# Patient Record
Sex: Male | Born: 1960 | Race: White | Hispanic: No | Marital: Married | Smoking: Former smoker
Health system: Southern US, Community
[De-identification: ages and names within clinical notes are randomized; demographics above are authoritative.]

## PROBLEM LIST (undated history)

## (undated) DIAGNOSIS — H269 Unspecified cataract: Secondary | ICD-10-CM

## (undated) DIAGNOSIS — Z860101 Personal history of adenomatous and serrated colon polyps: Secondary | ICD-10-CM

## (undated) DIAGNOSIS — R454 Irritability and anger: Secondary | ICD-10-CM

## (undated) DIAGNOSIS — D239 Other benign neoplasm of skin, unspecified: Secondary | ICD-10-CM

## (undated) DIAGNOSIS — K589 Irritable bowel syndrome without diarrhea: Secondary | ICD-10-CM

## (undated) DIAGNOSIS — M47812 Spondylosis without myelopathy or radiculopathy, cervical region: Secondary | ICD-10-CM

## (undated) DIAGNOSIS — K219 Gastro-esophageal reflux disease without esophagitis: Secondary | ICD-10-CM

## (undated) DIAGNOSIS — M7712 Lateral epicondylitis, left elbow: Secondary | ICD-10-CM

## (undated) DIAGNOSIS — J392 Other diseases of pharynx: Secondary | ICD-10-CM

## (undated) DIAGNOSIS — F32A Depression, unspecified: Secondary | ICD-10-CM

## (undated) DIAGNOSIS — K52832 Lymphocytic colitis: Secondary | ICD-10-CM

## (undated) DIAGNOSIS — F419 Anxiety disorder, unspecified: Secondary | ICD-10-CM

## (undated) DIAGNOSIS — M51369 Other intervertebral disc degeneration, lumbar region without mention of lumbar back pain or lower extremity pain: Secondary | ICD-10-CM

## (undated) DIAGNOSIS — Z86010 Personal history of colonic polyps: Secondary | ICD-10-CM

## (undated) DIAGNOSIS — M792 Neuralgia and neuritis, unspecified: Secondary | ICD-10-CM

## (undated) DIAGNOSIS — G894 Chronic pain syndrome: Secondary | ICD-10-CM

## (undated) DIAGNOSIS — G2581 Restless legs syndrome: Secondary | ICD-10-CM

## (undated) DIAGNOSIS — G629 Polyneuropathy, unspecified: Secondary | ICD-10-CM

## (undated) DIAGNOSIS — H409 Unspecified glaucoma: Secondary | ICD-10-CM

## (undated) DIAGNOSIS — N400 Enlarged prostate without lower urinary tract symptoms: Secondary | ICD-10-CM

## (undated) DIAGNOSIS — R1310 Dysphagia, unspecified: Secondary | ICD-10-CM

## (undated) HISTORY — DX: Irritability and anger: R45.4

## (undated) HISTORY — DX: Lateral epicondylitis, left elbow: M77.12

## (undated) HISTORY — DX: Other intervertebral disc degeneration, lumbar region without mention of lumbar back pain or lower extremity pain: M51.369

## (undated) HISTORY — DX: Depression, unspecified: F32.A

## (undated) HISTORY — DX: Benign prostatic hyperplasia without lower urinary tract symptoms: N40.0

## (undated) HISTORY — DX: Spondylosis without myelopathy or radiculopathy, cervical region: M47.812

## (undated) HISTORY — DX: Personal history of colonic polyps: Z86.010

## (undated) HISTORY — DX: Dysphagia, unspecified: R13.10

## (undated) HISTORY — PX: COLONOSCOPY W/ POLYPECTOMY: SHX1380

## (undated) HISTORY — DX: Polyneuropathy, unspecified: G62.9

## (undated) HISTORY — PX: TRABECULECTOMY: SHX107

## (undated) HISTORY — DX: Unspecified cataract: H26.9

## (undated) HISTORY — DX: Unspecified glaucoma: H40.9

## (undated) HISTORY — PX: ESOPHAGOGASTRODUODENOSCOPY: SHX1529

## (undated) HISTORY — DX: Restless legs syndrome: G25.81

## (undated) HISTORY — DX: Anxiety disorder, unspecified: F41.9

## (undated) HISTORY — DX: Irritable bowel syndrome, unspecified: K58.9

## (undated) HISTORY — DX: Gastro-esophageal reflux disease without esophagitis: K21.9

## (undated) HISTORY — PX: CATARACT EXTRACTION: SUR2

## (undated) HISTORY — DX: Personal history of adenomatous and serrated colon polyps: Z86.0101

## (undated) HISTORY — PX: EYE SURGERY: SHX253

## (undated) HISTORY — DX: Other diseases of pharynx: J39.2

## (undated) HISTORY — DX: Chronic pain syndrome: G89.4

## (undated) HISTORY — DX: Other benign neoplasm of skin, unspecified: D23.9

## (undated) HISTORY — DX: Ankylosing hyperostosis (forestier), site unspecified: M48.10

## (undated) HISTORY — DX: Neuralgia and neuritis, unspecified: M79.2

---
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
## 1999-06-25 HISTORY — PX: VASECTOMY: SHX75

## 2006-06-24 HISTORY — PX: TRANSURETHRAL RESECTION OF PROSTATE: SHX73

## 2007-06-25 HISTORY — PX: TRABECULECTOMY: SHX107

## 2008-07-18 ENCOUNTER — Encounter: Admission: RE | Admit: 2008-07-18 | Discharge: 2008-07-18 | Payer: Self-pay | Admitting: Family Medicine

## 2009-03-21 ENCOUNTER — Encounter: Admission: RE | Admit: 2009-03-21 | Discharge: 2009-03-21 | Payer: Self-pay | Admitting: Neurology

## 2009-04-05 ENCOUNTER — Emergency Department (HOSPITAL_COMMUNITY): Admission: EM | Admit: 2009-04-05 | Discharge: 2009-04-05 | Payer: Self-pay | Admitting: Family Medicine

## 2009-04-12 ENCOUNTER — Encounter: Admission: RE | Admit: 2009-04-12 | Discharge: 2009-04-12 | Payer: Self-pay | Admitting: Neurology

## 2009-11-15 ENCOUNTER — Ambulatory Visit (HOSPITAL_BASED_OUTPATIENT_CLINIC_OR_DEPARTMENT_OTHER): Admission: RE | Admit: 2009-11-15 | Discharge: 2009-11-16 | Payer: Self-pay | Admitting: Urology

## 2010-04-20 ENCOUNTER — Encounter: Admission: RE | Admit: 2010-04-20 | Discharge: 2010-04-20 | Payer: Self-pay | Admitting: Family Medicine

## 2010-09-10 LAB — POCT HEMOGLOBIN-HEMACUE: Hemoglobin: 15.9 g/dL (ref 13.0–17.0)

## 2010-12-10 ENCOUNTER — Other Ambulatory Visit: Payer: Self-pay | Admitting: *Deleted

## 2010-12-10 DIAGNOSIS — M79603 Pain in arm, unspecified: Secondary | ICD-10-CM

## 2010-12-10 DIAGNOSIS — M542 Cervicalgia: Secondary | ICD-10-CM

## 2010-12-15 ENCOUNTER — Ambulatory Visit
Admission: RE | Admit: 2010-12-15 | Discharge: 2010-12-15 | Disposition: A | Discharge status: Home / Self Care | Payer: 59 | Source: Ambulatory Visit | Attending: *Deleted | Admitting: *Deleted

## 2010-12-15 DIAGNOSIS — M79603 Pain in arm, unspecified: Secondary | ICD-10-CM

## 2010-12-15 DIAGNOSIS — M542 Cervicalgia: Secondary | ICD-10-CM

## 2012-06-11 DIAGNOSIS — R209 Unspecified disturbances of skin sensation: Secondary | ICD-10-CM | POA: Insufficient documentation

## 2012-06-11 DIAGNOSIS — G63 Polyneuropathy in diseases classified elsewhere: Secondary | ICD-10-CM | POA: Insufficient documentation

## 2012-06-11 DIAGNOSIS — M47812 Spondylosis without myelopathy or radiculopathy, cervical region: Secondary | ICD-10-CM | POA: Insufficient documentation

## 2012-10-07 ENCOUNTER — Encounter: Payer: Self-pay | Admitting: Neurology

## 2012-10-07 DIAGNOSIS — G63 Polyneuropathy in diseases classified elsewhere: Secondary | ICD-10-CM

## 2012-10-07 DIAGNOSIS — M47812 Spondylosis without myelopathy or radiculopathy, cervical region: Secondary | ICD-10-CM

## 2012-10-07 DIAGNOSIS — R209 Unspecified disturbances of skin sensation: Secondary | ICD-10-CM

## 2012-10-13 ENCOUNTER — Ambulatory Visit (INDEPENDENT_AMBULATORY_CARE_PROVIDER_SITE_OTHER): Payer: 59 | Admitting: Neurology

## 2012-10-13 ENCOUNTER — Encounter: Payer: Self-pay | Admitting: Neurology

## 2012-10-13 VITALS — BP 116/67 | HR 76 | Ht 73.25 in | Wt 172.0 lb

## 2012-10-13 DIAGNOSIS — G63 Polyneuropathy in diseases classified elsewhere: Secondary | ICD-10-CM

## 2012-10-13 DIAGNOSIS — R209 Unspecified disturbances of skin sensation: Secondary | ICD-10-CM

## 2012-10-13 MED ORDER — NORTRIPTYLINE HCL 10 MG PO CAPS: ORAL_CAPSULE | ORAL | Status: DC

## 2012-10-13 NOTE — Progress Notes (Signed)
Reason for visit: Peripheral neuropathy  Keith Mckee is an 52 y.o. male  History of present illness:  Keith Mckee is a 52 year old right-handed white male with a three-year history of a peripheral neuropathy associated with numbness in the feet and hands. The patient has discomfort that affects his ability to sleep at night. The patient was placed on low-dose nortriptyline, taking 20 mg at night. The patient indicates that he had excellent improvement in his symptoms for about one month, but the discomfort has come back on him. The patient is not sleeping well on certain nights. The patient may have significant variation in his discomfort level from one day to the next. The patient denies any significant balance issues. The patient indicates that his father also has evidence of a peripheral neuropathy, but he is greater than 84 years old. The patient returns to this office for an evaluation.   Past Medical History  Diagnosis Date  . Peripheral neuropathy   . Glaucoma   . Enlargement (benign) of prostate   . Cervical spondylosis     Past Surgical History  Procedure Laterality Date  . Vasectomy    . Trabeculectomy      BPH    Family History  Problem Relation Age of Onset  . Glaucoma Father   . Heart disease Father   . Neuropathy Father     Peripheral neuropathy    Social history:  reports that he quit smoking about 14 years ago. His smoking use included Cigarettes. He smoked 0.00 packs per day. He does not have any smokeless tobacco history on file. He reports that  drinks alcohol. He reports that he does not use illicit drugs.  Allergies:  Allergies  Allergen Reactions  . Gabapentin     tired    Medications:  Current Outpatient Prescriptions on File Prior to Visit  Medication Sig Dispense Refill  . bimatoprost (LUMIGAN) 0.01 % SOLN Place 1 drop into both eyes at bedtime.      . prednisoLONE sodium phosphate (INFLAMASE FORTE) 1 % ophthalmic solution Place 1 drop  into both eyes daily.       No current facility-administered medications on file prior to visit.    ROS:  Out of a complete 14 system review of symptoms, the patient complains only of the following symptoms, and all other reviewed systems are negative.  Numbness Insomnia Restless legs  Blood pressure 116/67, pulse 76, height 6' 1.25" (1.861 m), weight 172 lb (78.019 kg).  Physical Exam  General: The patient is alert and cooperative at the time of the examination.  Skin: No significant peripheral edema is noted.   Neurologic Exam  Cranial nerves: Facial symmetry is present. Speech is normal, no aphasia or dysarthria is noted. Extraocular movements are full. Visual fields are full.  Motor: The patient has good strength in all 4 extremities. The patient is able to walk on heels and the toes.  Coordination: The patient has good finger-nose-finger and heel-to-shin bilaterally.  Gait and station: The patient has a normal gait. Tandem gait is normal. Romberg is negative. No drift is seen.  Reflexes: Deep tendon reflexes are symmetric, but are depressed.   Assessment/Plan:  1. Peripheral neuropathy  The patient will require an increase in the nortriptyline dose to control the level of discomfort. The patient will followup in 5 or 6 months. The patient will contact me if he needs a higher dose of this medication. The patient appears to be tolerating the medication well. Previously, he did  not tolerate gabapentin, as this made him feel tired throughout the day.  Keith Palau MD 10/13/2012 8:39 PM  [facility] [address] [place], [place] [address]  Phone [phone] Fax [phone]

## 2013-01-18 ENCOUNTER — Other Ambulatory Visit: Payer: Self-pay | Admitting: Gastroenterology

## 2013-03-16 ENCOUNTER — Ambulatory Visit (INDEPENDENT_AMBULATORY_CARE_PROVIDER_SITE_OTHER): Payer: 59 | Admitting: Neurology

## 2013-03-16 ENCOUNTER — Encounter: Payer: Self-pay | Admitting: Neurology

## 2013-03-16 VITALS — BP 116/75 | HR 91 | Wt 177.0 lb

## 2013-03-16 DIAGNOSIS — R209 Unspecified disturbances of skin sensation: Secondary | ICD-10-CM

## 2013-03-16 DIAGNOSIS — G63 Polyneuropathy in diseases classified elsewhere: Secondary | ICD-10-CM

## 2013-03-16 NOTE — Progress Notes (Signed)
Reason for visit: Peripheral neuropathy  Keith Mckee is an 52 y.o. male  History of present illness:  Keith Mckee is a 52 year old right-handed white male with a history of a peripheral neuropathy associated discomfort in the feet and hands. The patient indicates that the nortriptyline has helped him significantly, but the effects of the medication will wear off after several months. The patient is on 30 mg at night, and he is having issues with constipation, and problems with voiding the bladder. The patient underwent a TURP in the past, which initially helped, but he is now having problems with urine flow once again. The patient indicates that he is taking fiber for constipation, but he is still having problems. The patient wonders whether he might want to switch off of the nortriptyline medication. The patient returns to this office for an evaluation. The patient denies any problems with balance or falls.  Past Medical History  Diagnosis Date  . Peripheral neuropathy   . Glaucoma   . Enlargement (benign) of prostate   . Cervical spondylosis   . Lateral epicondylitis of left elbow     Past Surgical History  Procedure Laterality Date  . Vasectomy    . Trabeculectomy      BPH    Family History  Problem Relation Age of Onset  . Glaucoma Father   . Heart disease Father   . Neuropathy Father     Peripheral neuropathy    Social history:  reports that he quit smoking about 14 years ago. His smoking use included Cigarettes. He smoked 0.00 packs per day. He has never used smokeless tobacco. He reports that  drinks alcohol. He reports that he does not use illicit drugs.    Allergies  Allergen Reactions  . Gabapentin     tired    Medications:  Current Outpatient Prescriptions on File Prior to Visit  Medication Sig Dispense Refill  . bimatoprost (LUMIGAN) 0.01 % SOLN Place 1 drop into both eyes at bedtime.      . Multiple Vitamin (MULTIVITAMIN) tablet Take 1 tablet by  mouth daily.      . nortriptyline (PAMELOR) 10 MG capsule Three capsules at night  90 capsule  5  . prednisoLONE sodium phosphate (INFLAMASE FORTE) 1 % ophthalmic solution Place 1 drop into both eyes daily.      . Probiotic Product (PROBIOTIC DAILY PO) Take 1 tablet by mouth 2 (two) times daily.       No current facility-administered medications on file prior to visit.    ROS:  Out of a complete 14 system review of symptoms, the patient complains only of the following symptoms, and all other reviewed systems are negative.  Blurred vision, double vision Numbness Constipation  Blood pressure 116/75, pulse 91, weight 177 lb (80.287 kg).  Physical Exam  General: The patient is alert and cooperative at the time of the examination.  Skin: No significant peripheral edema is noted.   Neurologic Exam  Cranial nerves: Facial symmetry is present. Speech is normal, no aphasia or dysarthria is noted. Extraocular movements are full. Visual fields are full.  Motor: The patient has good strength in all 4 extremities.  Coordination: The patient has good finger-nose-finger and heel-to-shin bilaterally.  Gait and station: The patient has a normal gait. Tandem gait is normal. Romberg is negative. No drift is seen.  Reflexes: Deep tendon reflexes are symmetric, but are depressed.   Assessment/Plan:  1. Peripheral neuropathy  The patient wants to come off of the  nortriptyline secondary to side effects associated with his bowels and bladder. The patient will be switched to Cymbalta, and samples were given. The patient will start at 30 mg dose, and then go to a 60 mg dose. If he finds that this is helpful, and well tolerated, he will contact our office for a prescription. The patient otherwise will followup in 6 months.  Marlan Palau MD 03/16/2013 7:02 PM  [facility] [address] [place], [place] [address]  Phone [phone] Fax [phone]

## 2013-04-02 ENCOUNTER — Other Ambulatory Visit: Payer: Self-pay | Admitting: Neurology

## 2013-04-02 ENCOUNTER — Encounter: Payer: Self-pay | Admitting: Neurology

## 2013-04-02 MED ORDER — NORTRIPTYLINE HCL 10 MG PO CAPS: ORAL_CAPSULE | ORAL | Status: DC

## 2013-04-29 ENCOUNTER — Other Ambulatory Visit: Payer: Self-pay

## 2013-05-13 ENCOUNTER — Other Ambulatory Visit: Payer: Self-pay | Admitting: Family Medicine

## 2013-05-13 ENCOUNTER — Ambulatory Visit
Admission: RE | Admit: 2013-05-13 | Discharge: 2013-05-13 | Disposition: A | Discharge status: Home / Self Care | Payer: 59 | Source: Ambulatory Visit | Attending: Family Medicine | Admitting: Family Medicine

## 2013-05-13 DIAGNOSIS — J209 Acute bronchitis, unspecified: Secondary | ICD-10-CM

## 2013-06-04 ENCOUNTER — Encounter: Payer: Self-pay | Admitting: Pulmonary Disease

## 2013-06-04 ENCOUNTER — Ambulatory Visit (INDEPENDENT_AMBULATORY_CARE_PROVIDER_SITE_OTHER): Payer: 59 | Admitting: Pulmonary Disease

## 2013-06-04 VITALS — BP 102/64 | HR 93 | Temp 98.2°F | Ht 73.0 in | Wt 172.6 lb

## 2013-06-04 DIAGNOSIS — R0609 Other forms of dyspnea: Secondary | ICD-10-CM

## 2013-06-04 DIAGNOSIS — R0600 Dyspnea, unspecified: Secondary | ICD-10-CM

## 2013-06-04 NOTE — Assessment & Plan Note (Signed)
The patient is describing dyspnea at rest and with exertion of 2 months duration. He notices this at rest with a feeling of shallow breathing, but also with exertion such as one flight of stairs. He is still able to participate in his exercise program. He has been treated for airways disease with no response. His lungs are totally clear today as was his recent chest x-ray. He does have a history of cervical spondylosis, and has upper extremity neuropathy of unknown origin. It raises the question whether he has something going on in his cervical spine that may be resulting in diaphragm dysfunction or respiratory muscle weakness? His history is really not suggestive of airways disease, despite his smoking history. At this point, he needs to have full pulmonary function studies off all bronchodilator medications. If these are unremarkable, would consider cardiac disease as well. He may benefit from a cardiopulmonary exercise test for further delineation of his sob.

## 2013-06-04 NOTE — Progress Notes (Signed)
   Subjective:    Patient ID: Keith Mckee, male    DOB: Jan 22, 1961, 52 y.o.   MRN: [id number]  HPI The patient is a 52 year old male who I've been asked to see for dyspnea. The patient was in his usual state of health until approximately 2 months ago, when he began to notice progressive shortness of breath over a one-week period, and it has been stable since then. He initially thought this was related to Cymbalta, but discontinuation did not improve his breathing. He primarily describes a feeling of shortness of breath at rest, and his perception is that it is related to shallow breathing or an inability to take a deep breath. He states that he will get winded walking up one flight of stairs, but yet he can still participate in his exercise program. He has no cough or chest congestion, but has had some hoarseness and what sounds like upper airway wheezing. He denies any history of asthma or allergies, and saw no difference in his breathing with a trial of Symbicort and prednisone. He has had a recent chest x-ray last month that was normal. The patient does have a history of cervical spondylosis, and has some type of unusual peripheral neuropathy that has been unexplained. However, he denies any motor weakness. He is being followed by neurology very closely, who has done extensive testing according to the patient. He has a history of smoking for 15 years, but has not done so for quite some time.   Review of Systems  Constitutional: Negative for fever and unexpected weight change.  HENT: Positive for sore throat. Negative for congestion, dental problem, ear pain, nosebleeds, postnasal drip, rhinorrhea, sinus pressure, sneezing and trouble swallowing.   Eyes: Negative for redness and itching.  Respiratory: Positive for shortness of breath. Negative for cough, chest tightness and wheezing.   Cardiovascular: Positive for chest pain ( only with bending--discomfort right side). Negative for palpitations (  irregular heartbeats) and leg swelling.  Gastrointestinal: Negative for nausea and vomiting.  Genitourinary: Negative for dysuria.  Musculoskeletal: Negative for joint swelling.  Skin: Negative for rash.  Neurological: Negative for headaches.  Hematological: Does not bruise/bleed easily.  Psychiatric/Behavioral: Negative for dysphoric mood. The patient is not nervous/anxious.        Objective:   Physical Exam Constitutional:  Well developed, no acute distress  HENT:  Nares patent without discharge  Oropharynx without exudate, palate and uvula are normal  Eyes:  Perrla, eomi, no scleral icterus  Neck:  No JVD, no TMG  Cardiovascular:  Normal rate, regular rhythm, no rubs or gallops.  No murmurs        Intact distal pulses  Pulmonary :  Normal breath sounds, no stridor or respiratory distress   No rales, rhonchi, or wheezing  Abdominal:  Soft, nondistended, bowel sounds present.  No tenderness noted.   Musculoskeletal:  No lower extremity edema noted.  Lymph Nodes:  No cervical lymphadenopathy noted  Skin:  No cyanosis noted  Neurologic:  Alert, appropriate, moves all 4 extremities without obvious deficit.         Assessment & Plan:

## 2013-06-04 NOTE — Patient Instructions (Signed)
Will do full breathing tests to evaluate airways and lung volumes. Stop all inhalers.  Will do the testing once you have been off inhalers for 2 weeks.  Will arrange followup once the results are available.

## 2013-07-15 ENCOUNTER — Ambulatory Visit (INDEPENDENT_AMBULATORY_CARE_PROVIDER_SITE_OTHER): Payer: 59 | Admitting: Pulmonary Disease

## 2013-07-15 ENCOUNTER — Encounter: Payer: Self-pay | Admitting: Pulmonary Disease

## 2013-07-15 VITALS — BP 112/78 | HR 90 | Temp 98.1°F | Ht 72.0 in | Wt 175.0 lb

## 2013-07-15 DIAGNOSIS — R0989 Other specified symptoms and signs involving the circulatory and respiratory systems: Secondary | ICD-10-CM

## 2013-07-15 DIAGNOSIS — R0600 Dyspnea, unspecified: Secondary | ICD-10-CM

## 2013-07-15 DIAGNOSIS — R0609 Other forms of dyspnea: Secondary | ICD-10-CM

## 2013-07-15 LAB — PULMONARY FUNCTION TEST
DL/VA % PRED: 82 %
DL/VA: 3.91 ml/min/mmHg/L
DLCO UNC: 24.49 ml/min/mmHg
DLCO unc % pred: 69 %
FEF 25-75 POST: 2.07 L/s
FEF 25-75 Pre: 2.18 L/sec
FEF2575-%CHANGE-POST: -4 %
FEF2575-%PRED-POST: 57 %
FEF2575-%PRED-PRE: 60 %
FEV1-%Change-Post: 0 %
FEV1-%PRED-POST: 78 %
FEV1-%PRED-PRE: 78 %
FEV1-POST: 3.27 L
FEV1-Pre: 3.26 L
FEV1FVC-%CHANGE-POST: 3 %
FEV1FVC-%PRED-PRE: 90 %
FEV6-%CHANGE-POST: -2 %
FEV6-%PRED-PRE: 88 %
FEV6-%Pred-Post: 85 %
FEV6-Post: 4.44 L
FEV6-Pre: 4.56 L
FEV6FVC-%Change-Post: 0 %
FEV6FVC-%PRED-POST: 103 %
FEV6FVC-%Pred-Pre: 103 %
FVC-%CHANGE-POST: -2 %
FVC-%PRED-POST: 82 %
FVC-%Pred-Pre: 85 %
FVC-PRE: 4.6 L
FVC-Post: 4.46 L
POST FEV1/FVC RATIO: 73 %
PRE FEV1/FVC RATIO: 71 %
PRE FEV6/FVC RATIO: 99 %
Post FEV6/FVC ratio: 100 %
RV % PRED: 110 %
RV: 2.4 L
TLC % pred: 93 %
TLC: 6.91 L

## 2013-07-15 NOTE — Progress Notes (Signed)
   Subjective:    Patient ID: Keith Mckee, male    DOB: [birth year], 53 y.o.   MRN: [id number]  HPI The patient comes in today for followup of his recent PFTs, as part of a workup for unexplained dyspnea. He was found to have no airflow obstruction, no restriction, and a mild reduction in his diffusion capacity. I have had a long discussion with him about his breathing studies, and answered all of his questions.   Review of Systems  Constitutional: Negative for fever and unexpected weight change.  HENT: Negative for congestion, dental problem, ear pain, nosebleeds, postnasal drip, rhinorrhea, sinus pressure, sneezing, sore throat and trouble swallowing.   Eyes: Negative for redness and itching.  Respiratory: Negative for cough, chest tightness, shortness of breath and wheezing.   Cardiovascular: Negative for palpitations and leg swelling.  Gastrointestinal: Negative for nausea and vomiting.  Genitourinary: Negative for dysuria.  Musculoskeletal: Negative for joint swelling.  Skin: Negative for rash.  Neurological: Negative for headaches.  Hematological: Does not bruise/bleed easily.  Psychiatric/Behavioral: Negative for dysphoric mood. The patient is not nervous/anxious.        Objective:   Physical Exam Thin male in no acute distress Nose without purulence or discharge noted Neck without lymphadenopathy or thyromegaly Lower extremities without edema, no cyanosis  Alert and oriented, moves all 4 extremities.       Assessment & Plan:

## 2013-07-15 NOTE — Progress Notes (Signed)
PFT done today. Jennifer Castillo, CMA  

## 2013-07-15 NOTE — Assessment & Plan Note (Signed)
The patient's PFTs today showed no airflow obstruction, no restriction, and a mild reduction in his diffusion capacity. This combined with a normal exam, clear chest x-ray, and a CT scan in the recent past with no evidence for interstitial disease, makes it unlikely that his dyspnea is related to a pulmonary process. I still cannot totally exclude neuromuscular weakness as being a contributing factor, however his total lung capacity has not been compromised. The patient has a strong family history of early onset heart disease, and with his mildly reduced DLCO, I think he needs a cardiac evaluation. If this is unrevealing, then we need to followup with further investigational studies for his symptoms. I would probably start with a cardiopulmonary exercise test for unexplained dyspnea. It had a long discussion with the patient about this, and he voices understanding. I will send a note to his primary care physician to arrange for a cardiac evaluation.

## 2013-07-15 NOTE — Patient Instructions (Signed)
Will send a note to Dr Alyson Ingles to arrange for a cardiac workup. If this is unrevealing, would consider a cardiopulmonary exercise test to evaluate for occult causes of shortness of breath.

## 2013-07-30 ENCOUNTER — Encounter: Payer: Self-pay | Admitting: Pulmonary Disease

## 2013-07-30 ENCOUNTER — Telehealth: Payer: Self-pay | Admitting: Pulmonary Disease

## 2013-07-30 ENCOUNTER — Other Ambulatory Visit: Payer: Self-pay | Admitting: Pulmonary Disease

## 2013-07-30 DIAGNOSIS — R0602 Shortness of breath: Secondary | ICD-10-CM

## 2013-07-30 DIAGNOSIS — Z8249 Family history of ischemic heart disease and other diseases of the circulatory system: Secondary | ICD-10-CM

## 2013-07-30 NOTE — Telephone Encounter (Signed)
Please d/c the order to refer to cardiology.  If the person who ordered would look at my note, this needs to come from the pt's primary md not [place].  Thanks.  Let pt know as well his primary needs to order this since he has a preference which cardiologist he would like to see you.

## 2013-08-02 NOTE — Telephone Encounter (Signed)
A MyChart message has been sent to the pt to let him the know that below information. I will talk with the [facility] to make sure this appointment does not get scheduled under [facility] name.

## 2013-08-10 ENCOUNTER — Ambulatory Visit: Payer: 59 | Admitting: Internal Medicine

## 2013-09-14 ENCOUNTER — Encounter: Payer: Self-pay | Admitting: Internal Medicine

## 2013-09-14 ENCOUNTER — Ambulatory Visit (INDEPENDENT_AMBULATORY_CARE_PROVIDER_SITE_OTHER): Payer: 59 | Admitting: Internal Medicine

## 2013-09-14 VITALS — BP 112/72 | HR 83 | Ht 73.0 in | Wt 176.9 lb

## 2013-09-14 DIAGNOSIS — R0609 Other forms of dyspnea: Secondary | ICD-10-CM

## 2013-09-14 DIAGNOSIS — R0989 Other specified symptoms and signs involving the circulatory and respiratory systems: Secondary | ICD-10-CM

## 2013-09-14 DIAGNOSIS — R0602 Shortness of breath: Secondary | ICD-10-CM

## 2013-09-14 DIAGNOSIS — R0600 Dyspnea, unspecified: Secondary | ICD-10-CM

## 2013-09-14 NOTE — Progress Notes (Signed)
OFFICE NOTE  Chief Complaint:  Dyspnea on exertion  Primary Care Physician: Vena Austria, MD  HPI:  Keith Mckee is a pleasant 53 year old male kindly referred to me by Dr. Gwenette Greet for evaluation of shortness of breath. His past medical history is significant for mostly ocular problems include glaucoma and eye surgery. He said has a family history of heart disease with his father who had CABG at age 80. His grandfather had heart disease as well. He said some problems with reflux but otherwise is fairly healthy. He is a former smoker but quit over 10 years ago. He drinks about 12 beers a week mostly on weekends. Does exercise regularly about 3-5 times a week for 60 minutes, mostly cardio, without any significant shortness of breath or chest pain. He does feel when he starts to exercise she is a little short of breath but after a while his breathing kicks in. He was thoroughly evaluated by Dr. Gwenette Greet including imaging and pulmonary function testing which were normal.  Keith Mckee also has neuropathy of unknown origin and he is currently on nortriptyline for this and followed by Dr. Jannifer Franklin. He reports his symptoms have improved, however it may be possible that there is a neurologic cause of his breathing difficulty. He reports his breathing is worse when he lays on his side particularly at night and there is definite positional component.  PMHx:  Past Medical History  Diagnosis Date  . Peripheral neuropathy   . Glaucoma   . Enlargement (benign) of prostate   . Cervical spondylosis   . Lateral epicondylitis of left elbow     Past Surgical History  Procedure Laterality Date  . Vasectomy    . Trabeculectomy      BPH  . Transurethral resection of prostate    . Cataract extraction      FAMHx:  Family History  Problem Relation Age of Onset  . Glaucoma Father   . Heart disease Father   . Neuropathy Father     Peripheral neuropathy    SOCHx:   reports that he quit  smoking about 15 years ago. His smoking use included Cigarettes. He has a 11.25 pack-year smoking history. He has never used smokeless tobacco. He reports that he drinks alcohol. He reports that he does not use illicit drugs.  ALLERGIES:  Allergies  Allergen Reactions  . Gabapentin     tired    ROS: A comprehensive review of systems was negative except for: Eyes: positive for glaucoma Respiratory: positive for dyspnea on exertion Neurological: positive for neuropathy  HOME MEDS: Current Outpatient Prescriptions  Medication Sig Dispense Refill  . bimatoprost (LUMIGAN) 0.01 % SOLN Place 1 drop into the left eye at bedtime.       . Multiple Vitamin (MULTIVITAMIN) tablet Take 1 tablet by mouth daily.      . nortriptyline (PAMELOR) 10 MG capsule Take 30 mg by mouth at bedtime. Three capsules at night      . polyethylene glycol (MIRALAX / GLYCOLAX) packet Take 17 g by mouth daily.      . Probiotic Product (PROBIOTIC DAILY PO) Take 2 tablets by mouth daily.       Marland Kitchen SIMBRINZA 1-0.2 % SUSP Place 1 drop into the left eye 2 (two) times daily.      . Sodium Bicarbonate POWD by Does not apply route. 1 tsp at night for heartburn and sore throat.       No current facility-administered medications for this visit.  LABS/IMAGING: No results found for this or any previous visit (from the past 48 hour(s)). No results found.  VITALS: BP 112/72  Pulse 83  Ht 6\' 1"  (1.854 m)  Wt 176 lb 14.4 oz (80.241 kg)  BMI 23.34 kg/m2  EXAM: General appearance: alert and no distress Neck: no carotid bruit, no JVD and thyroid not enlarged, symmetric, no tenderness/mass/nodules Lungs: clear to auscultation bilaterally Heart: regular rate and rhythm, S1, S2 normal, no murmur, click, rub or gallop Abdomen: soft, non-tender; bowel sounds normal; no masses,  no organomegaly Extremities: extremities normal, atraumatic, no cyanosis or edema Pulses: 2+ and symmetric Skin: Skin color, texture, turgor normal. No  rashes or lesions Neurologic: Grossly normal Psych: Mood, affect normal  EKG: Normal sinus rhythm at 83  ASSESSMENT: 1. Dyspnea on exertion 2. Family history premature coronary disease  PLAN: 1.   Mr. Leaf has few cardiac risk factors. He was a prior smoker however not heavy and quit over 10 years ago. His pulmonary function testing and pulmonary imaging were normal. He does have an unexplained peripheral neuropathy in his raises the question as to whether or not there may be a neuropathic component to his shortness of breath. He reports being able to exercise at a fairly high level, getting his heart rate up to the 150s and only noticing a small amount of shortness of breath at the beginning of exercise.  I would recommend cardiopulmonary exercise testing as a good way to evaluate for potential ischemia, low VO2, or other potential cardiopulmonary causes of his shortness of breath. Traditional treadmill exercise stress testing or nuclear stress testing is really not indicated given the high level of exertion he can perform during routine exercise without limitation.  Thanks again for your referral. Plan to see him back in a few weeks to discuss the results of his CPET testing.  Pixie Casino, MD, [facility] Attending Cardiologist [facility]  Malcolm Hetz C 09/14/2013, 8:41 AM

## 2013-09-14 NOTE — Patient Instructions (Signed)
Dr Debara Pickett has ordered a cardiometabolic test - this is done in our office on Mondays.  What is a Cardiopulmonary Exercise Test (CPET)?   The Cardiopulmonary Exercise Test is a highly sensitive, non-invasive stress test. It is considered a stress test because the exercise stresses your body's systems by making them work faster and harder. A disease or condition that affects the heart, lungs or muscles will limit how much faster and harder these systems can work. A CPET assesses how well the heart, lungs, and muscles are working individually, and how these systems are working in unison. Your heart and lungs work together to deliver oxygen to your muscles, where it is used to make energy, and to remove carbon dioxide from your body.  The full cardiopulmonary system is assessed during a CPET by measuring the amount of oxygen your body is using, the amount of carbon dioxide it is producing, your breathing pattern, and electrocardiogram (EKG) while you are riding a stationary bicycle.  The traditional treadmill stress test only relies on the EKG, which only partially assesses the heart and nothing else. Besides detecting problems in multiple body systems, the CPET is also used to monitor changes in your disease condition, the effect of certain medications on your body, and if medical therapy is improving your condition.  What conditions can be detected/monitored by the Cardiopulmonary ExerciseTest?  Heart, lung, and metabolic conditions may cause shortness of breath, exercise intolerance or discomfort and pain in the chest. The CPET is the only test that can simultaneously determine which of these systems is causing the problem   Your physician recommends that you schedule a follow-up appointment in about 1 month - after your test.

## 2013-09-20 DIAGNOSIS — R0602 Shortness of breath: Secondary | ICD-10-CM

## 2013-09-22 ENCOUNTER — Ambulatory Visit (INDEPENDENT_AMBULATORY_CARE_PROVIDER_SITE_OTHER): Payer: 59 | Admitting: Neurology

## 2013-09-22 ENCOUNTER — Encounter: Payer: Self-pay | Admitting: Neurology

## 2013-09-22 VITALS — BP 118/76 | HR 95 | Wt 175.0 lb

## 2013-09-22 DIAGNOSIS — G63 Polyneuropathy in diseases classified elsewhere: Secondary | ICD-10-CM

## 2013-09-22 DIAGNOSIS — R0989 Other specified symptoms and signs involving the circulatory and respiratory systems: Secondary | ICD-10-CM

## 2013-09-22 DIAGNOSIS — M47812 Spondylosis without myelopathy or radiculopathy, cervical region: Secondary | ICD-10-CM

## 2013-09-22 DIAGNOSIS — R0600 Dyspnea, unspecified: Secondary | ICD-10-CM

## 2013-09-22 DIAGNOSIS — R0609 Other forms of dyspnea: Secondary | ICD-10-CM

## 2013-09-22 NOTE — Progress Notes (Signed)
Reason for visit: Peripheral neuropathy  Keith Mckee is an 53 y.o. male  History of present illness:  Keith Mckee is a 53 year old right-handed white male with a history of a peripheral neuropathy. The patient has some dysesthesias affecting the hands and feet, and he complains more about the hands that he does of the lower extremities. The patient has been on nortriptyline which offered some benefit, as he has most of his discomfort at nighttime. The patient indicates that he could not tolerate Cymbalta well, and the nortriptyline is causing a lot of constipation. The patient is taking MiraLax on a daily basis. The patient indicates that recently he has had some issues with shortness of breath. The patient has had a full pulmonary and cardiology evaluation that has not been revealing. The patient was told that he might have a neuropathy problem affecting his diaphragm. The patient denies any significant balance issues, and he has had no falls. The nortriptyline does allow him to sleep well at night.  Past Medical History  Diagnosis Date  . Peripheral neuropathy   . Glaucoma   . Enlargement (benign) of prostate   . Cervical spondylosis   . Lateral epicondylitis of left elbow     Past Surgical History  Procedure Laterality Date  . Vasectomy    . Trabeculectomy      BPH  . Transurethral resection of prostate    . Cataract extraction      Family History  Problem Relation Age of Onset  . Glaucoma Father   . Heart disease Father   . Neuropathy Father     Peripheral neuropathy    Social history:  reports that he quit smoking about 15 years ago. His smoking use included Cigarettes. He has a 11.25 pack-year smoking history. He has never used smokeless tobacco. He reports that he drinks alcohol. He reports that he does not use illicit drugs.    Allergies  Allergen Reactions  . Gabapentin     tired    Medications:  Current Outpatient Prescriptions on File Prior to Visit    Medication Sig Dispense Refill  . bimatoprost (LUMIGAN) 0.01 % SOLN Place 1 drop into the left eye at bedtime.       . Multiple Vitamin (MULTIVITAMIN) tablet Take 1 tablet by mouth daily.      . nortriptyline (PAMELOR) 10 MG capsule Take 30 mg by mouth at bedtime. Three capsules at night      . polyethylene glycol (MIRALAX / GLYCOLAX) packet Take 17 g by mouth daily.      . Probiotic Product (PROBIOTIC DAILY PO) Take 2 tablets by mouth daily.       Marland Kitchen SIMBRINZA 1-0.2 % SUSP Place 1 drop into the left eye 2 (two) times daily.      . Sodium Bicarbonate POWD by Does not apply route. 1 tsp at night for heartburn and sore throat.       No current facility-administered medications on file prior to visit.    ROS:  Out of a complete 14 system review of symptoms, the patient complains only of the following symptoms, and all other reviewed systems are negative.  Neck stiffness Eye itching, eye redness, double vision Shortness of breath Constipation Restless legs Back pain, neck pain Numbness  Blood pressure 118/76, pulse 95, weight 175 lb (79.379 kg).  Physical Exam  General: The patient is alert and cooperative at the time of the examination.  Skin: No significant peripheral edema is noted.   Neurologic  Exam  Mental status: The patient is oriented x 3.  Cranial nerves: Facial symmetry is present. Speech is normal, no aphasia or dysarthria is noted. Extraocular movements are full. Visual fields are full.  Motor: The patient has good strength in all 4 extremities. The patient is able walk on heels and the toes.  Sensory examination: Soft touch sensation is symmetric on the face, arms, or legs. No definite stocking pattern pinprick sensory deficit is noted.  Coordination: The patient has good finger-nose-finger and heel-to-shin bilaterally.  Gait and station: The patient has a normal gait. Tandem gait is normal. Romberg is negative. No drift is seen.  Reflexes: Deep tendon  reflexes are symmetric, but are depressed.    MRI cervical spine 12/15/2010:  IMPRESSION:  1. Chronic multilevel cervical disc and facet degeneration with  little change since 2009.  2. Moderate multifactorial right C4 foraminal stenosis has  increased.  3. Multifactorial mild spinal stenosis at C5-C6 with severe  bilateral C6 foraminal stenosis is stable.  4. Moderate multifactorial bilateral C5 and C7 nerve level  foraminal stenosis is stable.    Assessment/Plan:  1. Peripheral neuropathy  2. Cervical spondylosis  3. Reported shortness of breath  I have recommended a repeat nerve conduction study to look at nerve function on the legs and arms to exclude a demyelinating neuropathy process. The patient does not wish to pursue this. The patient has documented cervical spondylosis that is non-operable by MRI that was done in 2012. The patient will be sent for further blood work today. The patient will followup in 6 months. The patient will be given a trial on a topical ointment for the hands and feet to decrease the pain.  Jill Alexanders MD 09/22/2013 8:59 PM  [facility] [address] [place], [place] [address]  Phone [phone] Fax [phone]

## 2013-09-24 LAB — ANTINEUTROPHIL CYTOPLASMIC AB: Atypical pANCA: <1:20 {titer}

## 2013-09-24 LAB — LYME, TOTAL AB TEST/REFLEX: Lyme IgG/IgM Ab: <0.91 {ISR} (ref 0.00–0.90)

## 2013-09-24 LAB — VGCC ANTIBODY: VGCC ANTIBODY: NEGATIVE

## 2013-09-24 LAB — ANTIMYELOPEROXIDASE (MPO) ABS: Myeloperoxidase Ab: <9 U/mL (ref 0.0–9.0)

## 2013-09-24 LAB — SJOGREN'S SYNDROME ANTIBODS(SSA + SSB)

## 2013-09-24 LAB — ACETYLCHOLINE RECEPTOR, BINDING

## 2013-09-24 LAB — SEDIMENTATION RATE: SED RATE: 2 mm/h (ref 0–30)

## 2013-09-27 ENCOUNTER — Telehealth: Payer: Self-pay

## 2013-09-27 NOTE — Telephone Encounter (Signed)
Message copied by Milta Deiters on Mon Sep 27, 2013  8:35 AM ------      Message from: Margette Fast      Created: Fri Sep 24, 2013  5:46 PM       Please call the patient. The blood work results are unremarkable. Thank you.            ----- Message -----         From: [facility] Lab Results In Interface         Sent: 09/23/2013   7:51 AM           To: Kathrynn Ducking, MD                   ------

## 2013-10-03 ENCOUNTER — Other Ambulatory Visit: Payer: Self-pay | Admitting: Neurology

## 2013-10-05 NOTE — Telephone Encounter (Signed)
Called patient three times. No answer.

## 2013-10-21 ENCOUNTER — Encounter: Payer: Self-pay | Admitting: Internal Medicine

## 2013-10-21 ENCOUNTER — Ambulatory Visit (INDEPENDENT_AMBULATORY_CARE_PROVIDER_SITE_OTHER): Payer: 59 | Admitting: Internal Medicine

## 2013-10-21 VITALS — BP 124/70 | HR 80 | Ht 73.0 in | Wt 174.3 lb

## 2013-10-21 DIAGNOSIS — R0600 Dyspnea, unspecified: Secondary | ICD-10-CM

## 2013-10-21 DIAGNOSIS — Z8249 Family history of ischemic heart disease and other diseases of the circulatory system: Secondary | ICD-10-CM

## 2013-10-21 DIAGNOSIS — R0989 Other specified symptoms and signs involving the circulatory and respiratory systems: Secondary | ICD-10-CM

## 2013-10-21 DIAGNOSIS — R0609 Other forms of dyspnea: Secondary | ICD-10-CM

## 2013-10-21 DIAGNOSIS — R9439 Abnormal result of other cardiovascular function study: Secondary | ICD-10-CM

## 2013-10-21 NOTE — Progress Notes (Signed)
OFFICE NOTE  Chief Complaint:  Dyspnea on exertion  Primary Care Physician: Vena Austria, MD  HPI:  Keith Mckee is a pleasant 53 year old male kindly referred to me by Dr. Gwenette Greet for evaluation of shortness of breath. His past medical history is significant for mostly ocular problems include glaucoma and eye surgery. He said has a family history of heart disease with his father who had CABG at age 19. His grandfather had heart disease as well. He said some problems with reflux but otherwise is fairly healthy. He is a former smoker but quit over 10 years ago. He drinks about 12 beers a week mostly on weekends. Does exercise regularly about 3-5 times a week for 60 minutes, mostly cardio, without any significant shortness of breath or chest pain. He does feel when he starts to exercise she is a little short of breath but after a while his breathing kicks in. He was thoroughly evaluated by Dr. Gwenette Greet including imaging and pulmonary function testing which were normal.  Keith Mckee also has neuropathy of unknown origin and he is currently on nortriptyline for this and followed by Dr. Jannifer Franklin. He reports his symptoms have improved, however it may be possible that there is a neurologic cause of his breathing difficulty. He reports his breathing is worse when he lays on his side particularly at night and there is definite positional component.  His cardiometabolic testing. He underwent MET test on 02/20/2014. Our ER was 1.23, peak the VO2 was 70%, peak heart rate was 91% predicted. Heart rate in the VO2 curves showed an ischemic response with an anaerobic threshold at 13.1. Testing did not suggest a pulmonary reason for his shortness of breath. There was a sharp increase in VO 2 with free pedaling suggesting increased oxygen requirements in the extremities.  PMHx:  Past Medical History  Diagnosis Date  . Peripheral neuropathy   . Glaucoma   . Enlargement (benign) of prostate   .  Cervical spondylosis   . Lateral epicondylitis of left elbow     Past Surgical History  Procedure Laterality Date  . Vasectomy    . Trabeculectomy      BPH  . Transurethral resection of prostate    . Cataract extraction      FAMHx:  Family History  Problem Relation Age of Onset  . Glaucoma Father   . Heart disease Father   . Neuropathy Father     Peripheral neuropathy    SOCHx:   reports that he quit smoking about 15 years ago. His smoking use included Cigarettes. He has a 11.25 pack-year smoking history. He has never used smokeless tobacco. He reports that he drinks alcohol. He reports that he does not use illicit drugs.  ALLERGIES:  Allergies  Allergen Reactions  . Gabapentin     tired    ROS: A comprehensive review of systems was negative except for: Eyes: positive for glaucoma Respiratory: positive for dyspnea on exertion Neurological: positive for neuropathy  HOME MEDS: Current Outpatient Prescriptions  Medication Sig Dispense Refill  . bimatoprost (LUMIGAN) 0.01 % SOLN Place 1 drop into the left eye at bedtime.       . Multiple Vitamin (MULTIVITAMIN) tablet Take 1 tablet by mouth daily.      . nortriptyline (PAMELOR) 10 MG capsule Take 30 mg by mouth at bedtime. Three capsules at night      . nortriptyline (PAMELOR) 10 MG capsule TAKE 3 CAPSULES AT NIGHT  270 capsule  1  .  polyethylene glycol (MIRALAX / GLYCOLAX) packet Take 17 g by mouth daily.      . Probiotic Product (PROBIOTIC DAILY PO) Take 2 tablets by mouth daily.       Marland Kitchen SIMBRINZA 1-0.2 % SUSP Place 1 drop into the left eye 2 (two) times daily.      . Sodium Bicarbonate POWD by Does not apply route. 1 tsp at night for heartburn and sore throat.       No current facility-administered medications for this visit.    LABS/IMAGING: No results found for this or any previous visit (from the past 48 hour(s)). No results found.  VITALS: BP 124/70  Pulse 80  Ht 6' 1"  (1.854 m)  Wt 174 lb 4.8 oz (79.062  kg)  BMI 23.00 kg/m2  EXAM: dyspnea  EKG: deferred  ASSESSMENT: 1. Dyspnea on exertion - MET test with VO2 of 70%, low risk 2. Family history premature coronary disease  PLAN: 1.   Keith Mckee continues to have some shortness of breath with exercise. I informed him that he would benefit from increased duration of exercise with a lower heart rate. He was provided with exercise Ranges based on his VO2. In addition, he may benefit from supplements including coenzyme Q 10 and L. arginine. He is still concerned about the possibility of coronary disease given his family history. As far screening is concerned one possibility would be a coronary calcium score. If this is negative, it would solidify his low risk. If it is abnormal, we may need to reassess his medical therapy including possibly driving his cholesterol lower with a statin. I will plan to contact him with the results of his calcium score once its obtained.  Pixie Casino, MD, [facility] Attending Cardiologist [place] 10/21/2013, 9:53 AM

## 2013-10-21 NOTE — Patient Instructions (Signed)
Dr. Debara Pickett has ordered a coronary calcium score test to be done at [facility].   We will call you with the results.   Your physician recommends that you schedule a follow-up appointment as needed.

## 2013-10-28 ENCOUNTER — Ambulatory Visit (INDEPENDENT_AMBULATORY_CARE_PROVIDER_SITE_OTHER)
Admission: RE | Admit: 2013-10-28 | Discharge: 2013-10-28 | Disposition: A | Discharge status: Home / Self Care | Payer: Self-pay | Source: Ambulatory Visit | Attending: Internal Medicine | Admitting: Internal Medicine

## 2013-10-28 ENCOUNTER — Encounter: Payer: Self-pay | Admitting: Internal Medicine

## 2013-10-28 DIAGNOSIS — R9439 Abnormal result of other cardiovascular function study: Secondary | ICD-10-CM

## 2013-10-28 DIAGNOSIS — Z8249 Family history of ischemic heart disease and other diseases of the circulatory system: Secondary | ICD-10-CM

## 2013-10-28 NOTE — Progress Notes (Signed)
LMTCB

## 2013-10-29 NOTE — Telephone Encounter (Signed)
Message forwarded to Dr. Debara Pickett.

## 2013-12-21 DIAGNOSIS — H401190 Primary open-angle glaucoma, unspecified eye, stage unspecified: Secondary | ICD-10-CM | POA: Insufficient documentation

## 2014-03-08 DIAGNOSIS — Z9883 Filtering (vitreous) bleb after glaucoma surgery status: Secondary | ICD-10-CM | POA: Insufficient documentation

## 2014-03-24 ENCOUNTER — Encounter: Payer: Self-pay | Admitting: Neurology

## 2014-03-24 ENCOUNTER — Ambulatory Visit (INDEPENDENT_AMBULATORY_CARE_PROVIDER_SITE_OTHER): Payer: 59 | Admitting: Neurology

## 2014-03-24 VITALS — BP 108/68 | HR 83 | Ht 72.0 in | Wt 173.0 lb

## 2014-03-24 DIAGNOSIS — M47812 Spondylosis without myelopathy or radiculopathy, cervical region: Secondary | ICD-10-CM

## 2014-03-24 DIAGNOSIS — G63 Polyneuropathy in diseases classified elsewhere: Secondary | ICD-10-CM

## 2014-03-24 MED ORDER — PREGABALIN 25 MG PO CAPS: 25.0000 mg | ORAL_CAPSULE | Freq: Two times a day (BID) | ORAL | Status: DC

## 2014-03-24 MED ORDER — NORTRIPTYLINE HCL 10 MG PO CAPS: 30.0000 mg | ORAL_CAPSULE | Freq: Every day | ORAL | Status: DC

## 2014-03-24 NOTE — Progress Notes (Signed)
Reason for visit: Peripheral neuropathy  Keith Mckee is an 53 y.o. male  History of present illness:  Keith Mckee is a 53 year old right-handed white male with a history of a peripheral neuropathy primarily associated with motor features affecting the legs. The patient is reporting sensory issues with discomfort in the feet that is now occurring during the daytime, not just at night. The patient is unable to walk with his bare feet secondary to discomfort. The patient in the past has been on gabapentin at nighttime, but this resulted in too much drowsiness during the day. The patient has had some issues with shortness of breath, but this issue is gradually improving, and he has undergone a workup in the past for this. The patient does have some mild imbalance when he is trying to stand on 1 foot while getting dressed. The patient does not report any falls. He was given a prescription for a compounded topical ointment previously, but he was unable to afford the medication, as his insurance would not cover the medication at all. The patient is on nortriptyline taking 30 mg at night, but he continues to have ongoing constipation issues, requiring daily use of MiraLax. The nortriptyline has been helpful, but medications such as Cymbalta have not been well-tolerated. The patient does report occasional numbness and tingling in the hands, but he correlates this with spasm and discomfort in the neck and shoulders that comes and goes. The patient has significant cervical spondylosis.  Past Medical History  Diagnosis Date  . Peripheral neuropathy   . Glaucoma   . Enlargement (benign) of prostate   . Cervical spondylosis   . Lateral epicondylitis of left elbow     Past Surgical History  Procedure Laterality Date  . Vasectomy    . Trabeculectomy      BPH  . Transurethral resection of prostate    . Cataract extraction      Family History  Problem Relation Age of Onset  . Glaucoma Father     . Heart disease Father   . Neuropathy Father     Peripheral neuropathy    Social history:  reports that he quit smoking about 15 years ago. His smoking use included Cigarettes. He has a 11.25 pack-year smoking history. He has never used smokeless tobacco. He reports that he drinks alcohol. He reports that he does not use illicit drugs.    Allergies  Allergen Reactions  . Gabapentin     tired    Medications:  Current Outpatient Prescriptions on File Prior to Visit  Medication Sig Dispense Refill  . bimatoprost (LUMIGAN) 0.01 % SOLN Place 1 drop into the left eye at bedtime.       . Multiple Vitamin (MULTIVITAMIN) tablet Take 1 tablet by mouth daily.      . polyethylene glycol (MIRALAX / GLYCOLAX) packet Take 17 g by mouth daily.      . Probiotic Product (PROBIOTIC DAILY PO) Take 2 tablets by mouth daily.       Marland Kitchen SIMBRINZA 1-0.2 % SUSP Place 1 drop into the left eye 2 (two) times daily.      . Sodium Bicarbonate POWD by Does not apply route. 1 tsp at night for heartburn and sore throat.       No current facility-administered medications on file prior to visit.    ROS:  Out of a complete 14 system review of symptoms, the patient complains only of the following symptoms, and all other reviewed systems are negative.  Eye itching, eye redness, double vision Palpitations of the heart Constipation Restless legs, frequent waking Back pain, neck pain, neck stiffness Numbness  Blood pressure 108/68, pulse 83, height 6' (1.829 m), weight 173 lb (78.472 kg).  Physical Exam  General: The patient is alert and cooperative at the time of the examination.  Skin: No significant peripheral edema is noted.   Neurologic Exam  Mental status: The patient is oriented x 3.  Cranial nerves: Facial symmetry is present. Speech is normal, no aphasia or dysarthria is noted. Extraocular movements are full. Visual fields are full.  Motor: The patient has good strength in all 4 extremities. The  patient is able to walk on heels and the toes.  Sensory examination: Soft touch sensation is symmetric on the face, arms, and legs. There may be a stocking pattern pinprick sensory deficit across the ankles bilaterally.  Coordination: The patient has good finger-nose-finger and heel-to-shin bilaterally.  Gait and station: The patient has a normal gait. Tandem gait is normal. Romberg is negative. No drift is seen.  Reflexes: Deep tendon reflexes are symmetric, but are depressed.   EMG/NCV 06/25/12:  Conclusions   Nerve conduction studies of the upper extremities were unremarkable. No evidence of carpal tunnel syndrome seen. The nerve conductions of the lower extremities show an unusual focal significant slowing involving the peroneal and to some degree the posterior tibial nerve on the left with sensory sparing. In the proper clinical setting, a multifocal motor neuropathy needs to be considered, as the nerve conduction velocities suggest a demyelinating lesion. EMG evaluation of the left upper extremity was normal.    Assessment/Plan:  One. Peripheral neuropathy  The patient continues to have some discomfort affecting the feet mainly. The discomfort is now occurring during the daytime, not just at night. The patient will be placed on low-dose Lyrica to see if he tolerates this medication and to determine whether or not this is effective. He will be placed on 25 mg twice daily, and he will contact me if he requires an alteration in dosing. The patient otherwise will followup in about 6 months. Previously, the patient did not wish to have a followup nerve conduction study. The last study was done in January 2014.  Keith Alexanders MD 03/25/2014 6:23 AM  [facility] [address] [place], [place] [address]  Phone [phone] Fax [phone]

## 2014-03-24 NOTE — Patient Instructions (Signed)
Peripheral Neuropathy Peripheral neuropathy is a type of nerve damage. It affects nerves that carry signals between the spinal cord and other parts of the body. These are called peripheral nerves. With peripheral neuropathy, one nerve or a group of nerves may be damaged.  CAUSES  Many things can damage peripheral nerves. For some people with peripheral neuropathy, the cause is unknown. Some causes include:  Diabetes. This is the most common cause of peripheral neuropathy.  Injury to a nerve.  Pressure or stress on a nerve that lasts a long time.  Too little vitamin B. Alcoholism can lead to this.  Infections.  Autoimmune diseases, such as multiple sclerosis and systemic lupus erythematosus.  Inherited nerve diseases.  Some medicines, such as cancer drugs.  Toxic substances, such as lead and mercury.  Too little blood flowing to the legs.  Kidney disease.  Thyroid disease. SIGNS AND SYMPTOMS  Different people have different symptoms. The symptoms you have will depend on which of your nerves is damaged. Common symptoms include:  Loss of feeling (numbness) in the feet and hands.  Tingling in the feet and hands.  Pain that burns.  Very sensitive skin.  Weakness.  Not being able to move a part of the body (paralysis).  Muscle twitching.  Clumsiness or poor coordination.  Loss of balance.  Not being able to control your bladder.  Feeling dizzy.  Sexual problems. DIAGNOSIS  Peripheral neuropathy is a symptom, not a disease. Finding the cause of peripheral neuropathy can be hard. To figure that out, your health care Jahyra Sukup will take a medical history and do a physical exam. A neurological exam will also be done. This involves checking things affected by your brain, spinal cord, and nerves (nervous system). For example, your health care Marlos Carmen will check your reflexes, how you move, and what you can feel.  Other types of tests may also be ordered, such as:  Blood  tests.  A test of the fluid in your spinal cord.  Imaging tests, such as CT scans or an MRI.  Electromyography (EMG). This test checks the nerves that control muscles.  Nerve conduction velocity tests. These tests check how fast messages pass through your nerves.  Nerve biopsy. A small piece of nerve is removed. It is then checked under a microscope. TREATMENT   Medicine is often used to treat peripheral neuropathy. Medicines may include:  Pain-relieving medicines. Prescription or over-the-counter medicine may be suggested.  Antiseizure medicine. This may be used for pain.  Antidepressants. These also may help ease pain from neuropathy.  Lidocaine. This is a numbing medicine. You might wear a patch or be given a shot.  Mexiletine. This medicine is typically used to help control irregular heart rhythms.  Surgery. Surgery may be needed to relieve pressure on a nerve or to destroy a nerve that is causing pain.  Physical therapy to help movement.  Assistive devices to help movement. HOME CARE INSTRUCTIONS   Only take over-the-counter or prescription medicines as directed by your health care Geovonni Meyerhoff. Follow the instructions carefully for any given medicines. Do not take any other medicines without first getting approval from your health care Francine Hannan.  If you have diabetes, work closely with your health care Candace Ramus to keep your blood sugar under control.  If you have numbness in your feet:  Check every day for signs of injury or infection. Watch for redness, warmth, and swelling.  Wear padded socks and comfortable shoes. These help protect your feet.  Do not do   things that put pressure on your damaged nerve.  Do not smoke. Smoking keeps blood from getting to damaged nerves.  Avoid or limit alcohol. Too much alcohol can cause a lack of B vitamins. These vitamins are needed for healthy nerves.  Develop a good support system. Coping with peripheral neuropathy can be  stressful. Talk to a mental health specialist or join a support group if you are struggling.  Follow up with your health care Kamel Haven as directed. SEEK MEDICAL CARE IF:   You have new signs or symptoms of peripheral neuropathy.  You are struggling emotionally from dealing with peripheral neuropathy.  You have a fever. SEEK IMMEDIATE MEDICAL CARE IF:   You have an injury or infection that is not healing.  You feel very dizzy or begin vomiting.  You have chest pain.  You have trouble breathing. Document Released: 05/31/2002 Document Revised: 02/20/2011 Document Reviewed: 02/15/2013 Exit[employer] Patient Information 2015 [employer]. This information is not intended to replace advice given to you by your health care Marselino Slayton. Make sure you discuss any questions you have with your health care Covey Baller.  

## 2014-03-28 ENCOUNTER — Other Ambulatory Visit: Payer: Self-pay | Admitting: Neurology

## 2014-03-31 ENCOUNTER — Telehealth: Payer: Self-pay

## 2014-03-31 NOTE — Telephone Encounter (Signed)
[employer] notified us they have approved our request for coverage on Lyrica effective until 03/29/2015 Ref # [id number]

## 2014-04-06 ENCOUNTER — Encounter: Payer: Self-pay | Admitting: Neurology

## 2014-08-20 IMAGING — CT CT HEART SCORING
1 of 3 series · 10 of 20 positions shown, 13 images · non-contrast
Comparison: none

ADDENDUM:
OVER-READ INTERPRETATION  CT CHEST

The following report is an over-read performed by radiologist Dr.
Danielmelanie [[email or web address]] on Creation date. This over-read
does not include interpretation of cardiac or coronary anatomy or
pathology. The CTA interpretation by the cardiologist is attached.
CLINICAL DATA: Risk stratification
EXAM:
Coronary Calcium Score
MEDICATIONS:
None
TECHNIQUE: The patient was scanned on a Siemens Sensation 16 slice scanner.
Axial non-contrast 3 mm slices were carried out through the heart.
The data set was analyzed on a dedicated work station and scored
using the Agatson method.

[Series 6: st thins for reformat · axial · 0.56mm/px · z∈[-320,-185]mm · 10 of 165 slices shown, 13 images]
[im 15/165  vessel]
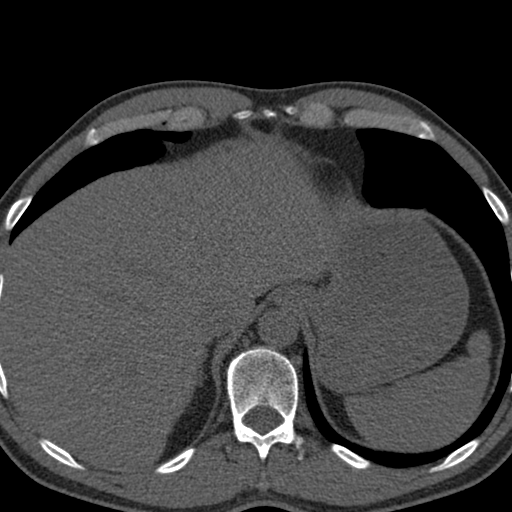
[im 15/165  lung]
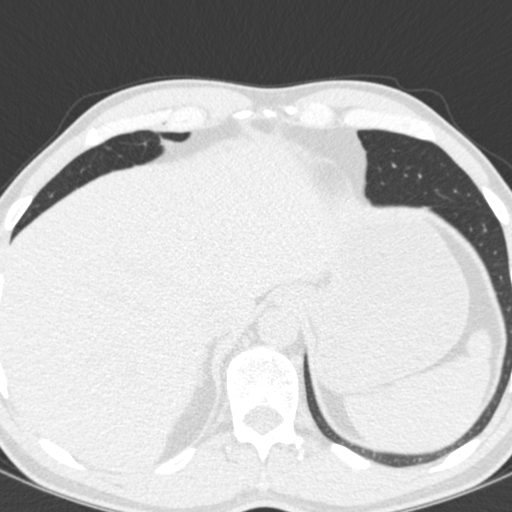
[im 30/165  vessel]
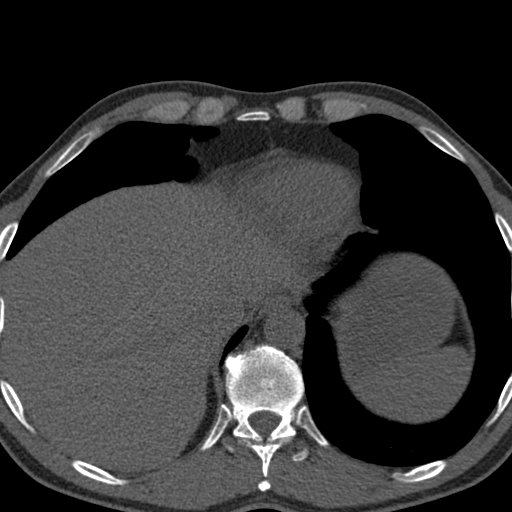
[im 45/165  vessel]
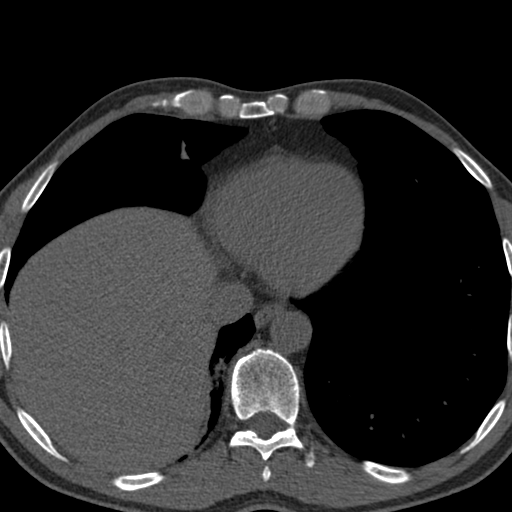
[im 60/165  vessel]
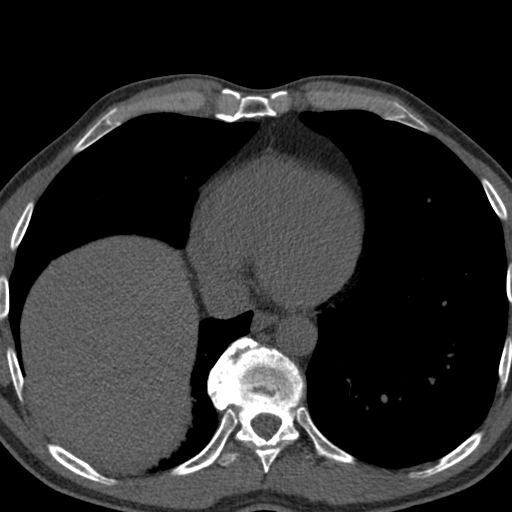
[im 75/165  vessel]
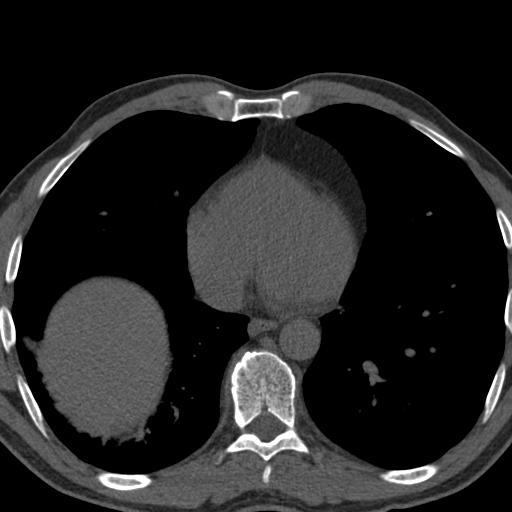
[im 75/165  lung]
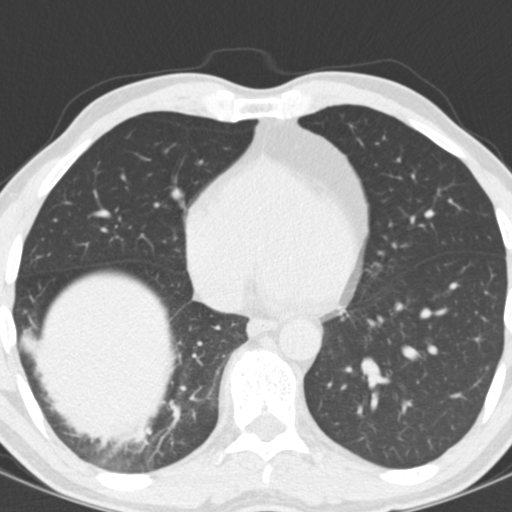
[im 90/165  vessel]
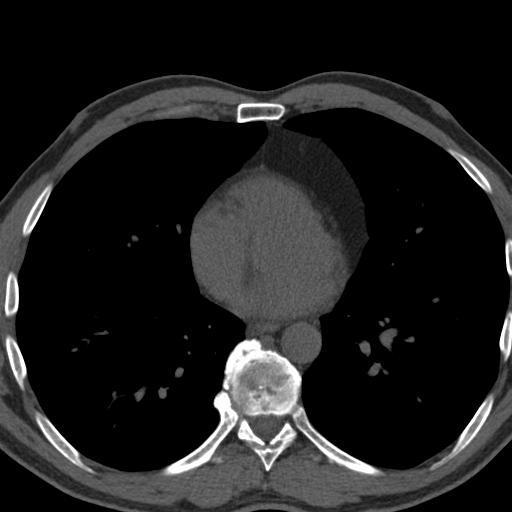
[im 105/165  vessel]
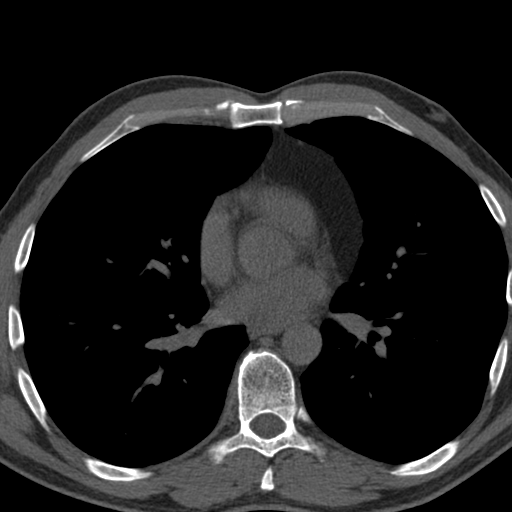
[im 120/165  vessel]
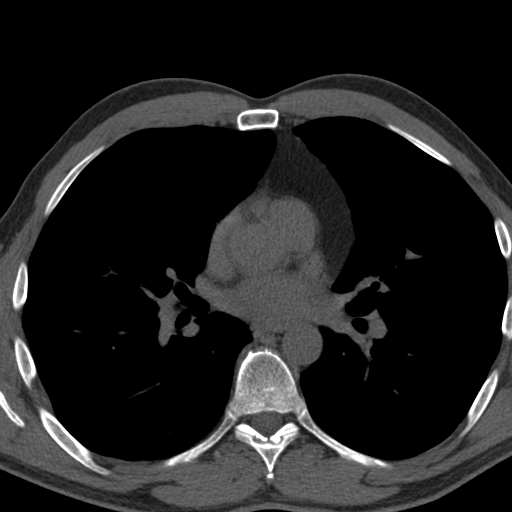
[im 135/165  vessel]
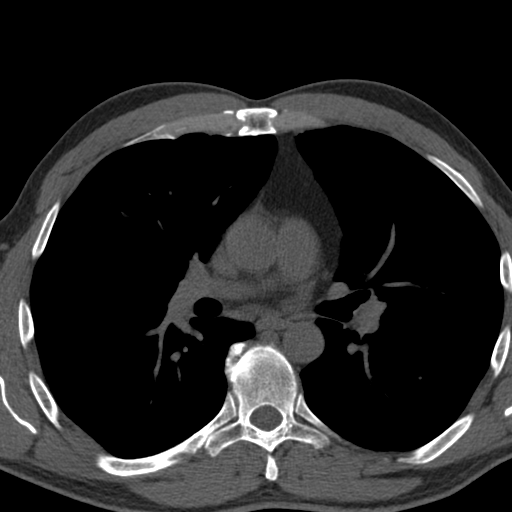
[im 135/165  lung]
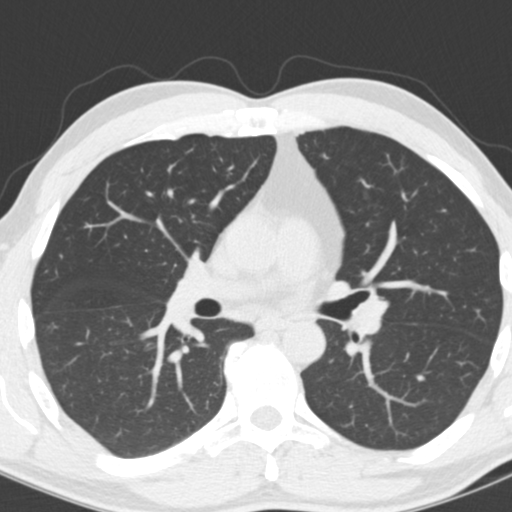
[im 150/165  vessel]
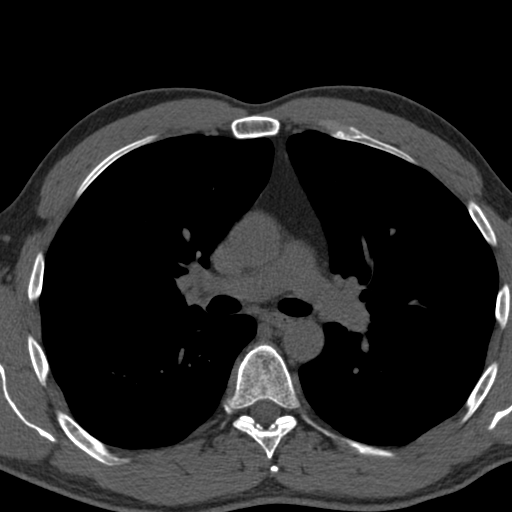

[10 of 20 positions shown; findings below may reference images not displayed]

FINDINGS: No pleural effusion identified. Stable nodule in the right lung base
measuring 4 mm, image 28/series 4. No new or enlarging pulmonary
nodules or mass is identified within the lungs. No airspace
consolidation or atelectasis. No edema. The heart size appears
normal. No pericardial effusion. No enlarged mediastinal or hilar
lymph nodes identified. Incidental imaging through the upper abdomen
is unremarkable. Review of the visualized bony structures is
significant for mild thoracic degenerative disc disease.
IMPRESSION: 1. No active cardiopulmonary abnormalities visualized.
2. Stable, benign 4 mm right lower lobe nodule.
FINDINGS: Non-cardiac: No significant non cardiac findings on limited lung and
soft tissue windows. See separate report from [REDACTED].

Pericardium: Normal

Coronary arteries:  Originating in a regular position.
IMPRESSION: Coronary calcium score of 0. This was 0 percentile for age and sex
matched control.

Polin Billiot

The patient was scanned on a Siemens Sensation 16 slice scanner.
Axial non-contrast 3 mm slices were carried out through the heart.
The data set was analyzed on a dedicated work station and scored
using the Agatson method.
FINDINGS: Non-cardiac: No significant non cardiac findings on limited lung and
soft tissue windows. See separate report from [REDACTED].

Pericardium: Normal

Coronary arteries:  Originating in a regular position.
IMPRESSION: Coronary calcium score of 0. This was 0 percentile for age and sex
matched control.

Polin Billiot

## 2014-09-27 ENCOUNTER — Encounter: Payer: Self-pay | Admitting: Neurology

## 2014-09-27 ENCOUNTER — Ambulatory Visit (INDEPENDENT_AMBULATORY_CARE_PROVIDER_SITE_OTHER): Payer: 59 | Admitting: Neurology

## 2014-09-27 VITALS — BP 111/72 | HR 89

## 2014-09-27 DIAGNOSIS — M47812 Spondylosis without myelopathy or radiculopathy, cervical region: Secondary | ICD-10-CM

## 2014-09-27 DIAGNOSIS — G63 Polyneuropathy in diseases classified elsewhere: Secondary | ICD-10-CM | POA: Diagnosis not present

## 2014-09-27 DIAGNOSIS — G2581 Restless legs syndrome: Secondary | ICD-10-CM | POA: Diagnosis not present

## 2014-09-27 HISTORY — DX: Restless legs syndrome: G25.81

## 2014-09-27 MED ORDER — CARBAMAZEPINE 200 MG PO TABS: ORAL_TABLET | ORAL | Status: DC

## 2014-09-27 NOTE — Progress Notes (Signed)
Reason for visit: Peripheral neuropathy  Keith Mckee is an 54 y.o. male  History of present illness:  Keith Mckee is a 54 year old right-handed white male with a history of a peripheral neuropathy. The patient is having discomfort mainly in the feet, but he also has some achy sensations in the hands as well. He may have lancinating pains in the feet, with numbness. The patient denies any significant balance issues, and no falls. He has difficulty sleeping at times related to the peripheral neuropathy pain, but he also has some episodes of restless leg syndrome. He has a history of cervical spondylosis, and he has developed a chronic achy pain in the neck and shoulders. The patient denies any radicular pain down the arms, but prior cervical MRI evaluation has shown evidence of neuroforaminal stenosis potentially affecting the C6 nerve roots bilaterally. The patient is currently on 30 mg nortriptyline at night. He cannot tolerate higher doses secondary to constipation issues. He was placed on low-dose Lyrica, but this offered no benefit, and he stopped the medication and also indicated that he had difficulty getting coverage for this through his insurance company. In the past, the topical compounded ointments have not been covered through his insurance. He returns for an evaluation.  Past Medical History  Diagnosis Date  . Peripheral neuropathy   . Glaucoma   . Enlargement (benign) of prostate   . Cervical spondylosis   . Lateral epicondylitis of left elbow   . Restless legs syndrome 09/27/2014    Past Surgical History  Procedure Laterality Date  . Vasectomy    . Trabeculectomy      BPH  . Transurethral resection of prostate    . Cataract extraction      Family History  Problem Relation Age of Onset  . Glaucoma Father   . Heart disease Father   . Neuropathy Father     Peripheral neuropathy    Social history:  reports that he quit smoking about 16 years ago. His smoking use  included Cigarettes. He has a 11.25 pack-year smoking history. He has never used smokeless tobacco. He reports that he drinks about 7.2 oz of alcohol per week. He reports that he does not use illicit drugs.    Allergies  Allergen Reactions  . Gabapentin     tired    Medications:  Prior to Admission medications   Medication Sig Start Date End Date Taking? Authorizing Provider  bimatoprost (LUMIGAN) 0.01 % SOLN Place 1 drop into the left eye at bedtime.    Yes Historical Provider, MD  Coenzyme Q10 (CO Q 10) 100 MG CAPS Take 100 mg by mouth daily.   Yes Historical Provider, MD  Multiple Vitamin (MULTIVITAMIN) tablet Take 1 tablet by mouth daily.   Yes Historical Provider, MD  nortriptyline (PAMELOR) 10 MG capsule Take 3 capsules (30 mg total) by mouth at bedtime. 03/24/14  Yes Kathrynn Ducking, MD  nortriptyline (PAMELOR) 10 MG capsule TAKE 3 CAPSULES AT NIGHT 03/28/14  Yes Kathrynn Ducking, MD  polyethylene glycol [facility] / GLYCOLAX) packet Take 17 g by mouth daily.   Yes Historical Provider, MD  prednisoLONE acetate (PRED FORTE) 1 % ophthalmic suspension Apply to eye. 11/29/10  Yes Historical Provider, MD  prednisoLONE acetate (PRED FORTE) 1 % ophthalmic suspension  03/15/14  Yes Historical Provider, MD  Probiotic Product (PROBIOTIC DAILY PO) Take 2 tablets by mouth daily.    Yes Historical Provider, MD  SIMBRINZA 1-0.2 % SUSP Place 1 drop into the  left eye 2 (two) times daily. 05/19/13  Yes Historical Provider, MD  carbamazepine (TEGRETOL) 200 MG tablet 1/2 tablet twice a day for 2 weeks, then take one tablet twice a day 09/27/14   Kathrynn Ducking, MD    ROS:  Out of a complete 14 system review of symptoms, the patient complains only of the following symptoms, and all other reviewed systems are negative.  Eye itching, eye redness Constipation Restless legs Back pain, neck pain, neck stiffness Numbness  Blood pressure 111/72, pulse 89.  Physical Exam  General: The patient is alert  and cooperative at the time of the examination.  Neuromuscular: Range of movement of the cervical spine is relatively full.  Skin: No significant peripheral edema is noted.   Neurologic Exam  Mental status: The patient is alert and oriented x 3 at the time of the examination. The patient has apparent normal recent and remote memory, with an apparently normal attention span and concentration ability.   Cranial nerves: Facial symmetry is present. Speech is normal, no aphasia or dysarthria is noted. Extraocular movements are full. Visual fields are full.  Motor: The patient has good strength in all 4 extremities.  Sensory examination: Soft touch sensation is symmetric on the face, arms, and legs.  Coordination: The patient has good finger-nose-finger and heel-to-shin bilaterally.  Gait and station: The patient has a normal gait. Tandem gait is normal. Romberg is negative. No drift is seen.  Reflexes: Deep tendon reflexes are symmetric, but are depressed.   MRI cervical 12/15/10:  IMPRESSION: 1. Chronic multilevel cervical disc and facet degeneration with little change since 2009.  2. Moderate multifactorial right C4 foraminal stenosis has increased. 3. Multifactorial mild spinal stenosis at C5-C6 with severe bilateral C6 foraminal stenosis is stable. 4. Moderate multifactorial bilateral C5 and C7 nerve level foraminal stenosis is stable.  * MRI scan images were reviewed online. I agree with the written report.    Assessment/Plan:  1. Peripheral neuropathy  2. Cervical spondylosis, chronic neck pain  3. Restless leg syndrome  The patient is having symptoms associated with his neuropathy, and with restless leg syndrome. He is on low-dose nortriptyline, unable to tolerate higher doses. We will add carbamazepine to the regimen to see if this improves some of his neuropathy pain. The patient will be sent for physical therapy for the cervical spine pain. We may consider  addition of a nonsteroidal anti-inflammatory medication or even epidural steroid injections of the neck in the future. He will follow-up in 4 or 5 months. We will need to check blood work at that time for the carbamazepine.  Keith Alexanders MD 09/27/2014 9:44 PM  [facility] [address] [place], [place] [address]  Phone [phone] Fax [phone]

## 2014-09-27 NOTE — Patient Instructions (Signed)

## 2014-11-02 ENCOUNTER — Telehealth: Payer: Self-pay

## 2014-11-02 NOTE — Telephone Encounter (Signed)
Left voicemail asking the patient to call back to reschedule 8/4 appointment due to Dr. Jannifer Franklin being out of the office that morning.

## 2014-11-07 NOTE — Telephone Encounter (Signed)
Left voicemail asking the patient to call back to reschedule 8/4 appointment due to Dr. Jannifer Franklin being out of the office that morning.

## 2014-11-15 NOTE — Telephone Encounter (Signed)
Left voicemail asking the patient to call back to reschedule 8/4 appointment due to Dr. Jannifer Franklin being out of the office that morning.

## 2014-11-15 NOTE — Telephone Encounter (Signed)
I have also mailed a letter to the patient asking him to call and r/s his appointment.

## 2014-11-16 NOTE — Telephone Encounter (Signed)
Patient returned [place] call. I told pt she was calling to reschedule his appointment on 8/4 and I could do that for him. He stated he was not in the mood to reschedule and he would call back if he chose to return to the office.

## 2014-12-21 ENCOUNTER — Ambulatory Visit (INDEPENDENT_AMBULATORY_CARE_PROVIDER_SITE_OTHER): Payer: 59 | Admitting: Family Medicine

## 2014-12-21 ENCOUNTER — Encounter: Payer: Self-pay | Admitting: Family Medicine

## 2014-12-21 VITALS — BP 113/73 | HR 68 | Temp 97.8°F | Resp 16 | Ht 72.0 in | Wt 165.0 lb

## 2014-12-21 DIAGNOSIS — Z0000 Encounter for general adult medical examination without abnormal findings: Secondary | ICD-10-CM | POA: Diagnosis not present

## 2014-12-21 DIAGNOSIS — Z125 Encounter for screening for malignant neoplasm of prostate: Secondary | ICD-10-CM

## 2014-12-21 DIAGNOSIS — L989 Disorder of the skin and subcutaneous tissue, unspecified: Secondary | ICD-10-CM

## 2014-12-21 NOTE — Progress Notes (Signed)
Office Note 12/31/2014  CC:  Chief Complaint  Patient presents with  . Establish Care   HPI:  Keith Mckee is a 54 y.o. White male who is here to establish care. Patient's most recent primary MD: Dr. Joneen Caraway at [facility] in [place]. Old records in EPIC/[facility] EMR were reviewed prior to or during today's visit.  He requests CPE today b/c he says it has been > 1 yr since last CPE, has no acute complaints. 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 Also has chronic neck pain/DDD with some intermittent hand numbness bilat.  Again, he states he has a neurologist but mentions he wants to find another one.  Past Medical History  Diagnosis Date  . Peripheral neuropathy   . Glaucoma   . BPH (benign prostatic hypertrophy)   . Cervical spondylosis   . Lateral epicondylitis of left elbow   . Restless legs syndrome 09/27/2014  . GERD (gastroesophageal reflux disease)   . History of adenomatous polyp of colon     Past Surgical History  Procedure Laterality Date  . Vasectomy    . Trabeculectomy      for uncontrolled glaucoma R eye  . Cataract extraction    . Transurethral resection of prostate      due to enlarged prostate  . Eye surgery Right   . Colonoscopy w/ polypectomy  08/2012    recall 3 yrs    Family History  Problem Relation Age of Onset  . Glaucoma Father   . Heart disease Father   . Neuropathy Father     Peripheral neuropathy  . Arthritis Father   . Parkinson's disease Father     History   Social History  . Marital Status: Married    Spouse Name: N/A  . Number of Children: 2  . Years of Education: Master D.   Occupational History  . [employer]  . handyman    Social History Main Topics  . Smoking status: Former Smoker -- 0.75 packs/day for 15 years    Types: Cigarettes    Quit date: 06/24/1998  . Smokeless tobacco: Never  Used     Comment: 2 pack per week  . Alcohol Use: 7.2 oz/week    12 Cans of beer per week     Comment: Patient drinks 12 beers a week  . Drug Use: No  . Sexual Activity: Not on file   Other Topics Concern  . Not on file   Social History Narrative   Married.   Educ: MBA   Occupation: Real Product manager   No tob, occ alc, no drugs.   Exercises regularly.   Patient is right handed.   Patient drinks 1 large cup of coffee daily.   Not taking carbamazepine liested below Outpatient Encounter Prescriptions as of 12/21/2014  Medication Sig  . bimatoprost (LUMIGAN) 0.01 % SOLN Place 1 drop into the left eye at bedtime.   . Coenzyme Q10 (CO Q 10) 100 MG CAPS Take 100 mg by mouth daily.  . Multiple Vitamin (MULTIVITAMIN) tablet Take 1 tablet by mouth daily.  . polyethylene glycol (MIRALAX / GLYCOLAX) packet Take 17 g by mouth daily.  . prednisoLONE acetate (PRED FORTE) 1 % ophthalmic suspension Apply to eye.  . Probiotic Product (PROBIOTIC DAILY PO) Take 2 tablets by mouth daily.   Marland Kitchen SIMBRINZA 1-0.2 %  SUSP Place 1 drop into the left eye 2 (two) times daily.  . [DISCONTINUED] nortriptyline (PAMELOR) 10 MG capsule Take 3 capsules (30 mg total) by mouth at bedtime.  . [DISCONTINUED] carbamazepine (TEGRETOL) 200 MG tablet 1/2 tablet twice a day for 2 weeks, then take one tablet twice a day (Patient not taking: Reported on 12/21/2014)  . [DISCONTINUED] nortriptyline (PAMELOR) 10 MG capsule TAKE 3 CAPSULES AT NIGHT (Patient not taking: Reported on 12/21/2014)  . [DISCONTINUED] prednisoLONE acetate (PRED FORTE) 1 % ophthalmic suspension    No facility-administered encounter medications on file as of 12/21/2014.    Allergies  Allergen Reactions  . Gabapentin     tired    ROS Review of Systems  Constitutional: Negative for fever, chills, appetite change and fatigue.  HENT: Negative for congestion, dental problem, ear pain and sore throat.   Eyes: Negative for discharge, redness and visual  disturbance.  Respiratory: Negative for cough, chest tightness, shortness of breath and wheezing.   Cardiovascular: Negative for chest pain, palpitations and leg swelling.  Gastrointestinal: Negative for nausea, vomiting, abdominal pain, diarrhea and blood in stool.  Genitourinary: Negative for dysuria, urgency, frequency, hematuria, flank pain and difficulty urinating.  Musculoskeletal: Positive for neck pain (chronic; has seen neurologist). Negative for myalgias, back pain, joint swelling, arthralgias and neck stiffness.  Skin: Negative for pallor and rash.  Neurological: Negative for dizziness, speech difficulty, weakness and headaches.       Chronic feet burning due to PN-has neurologist   Hematological: Negative for adenopathy. Does not bruise/bleed easily.  Psychiatric/Behavioral: Negative for confusion and sleep disturbance. The patient is not nervous/anxious.     PE; Blood pressure 113/73, pulse 68, temperature 97.8 F (36.6 C), temperature source Oral, resp. rate 16, height 6' (1.829 m), weight 165 lb (74.844 kg), SpO2 98 %. Gen: Alert, well appearing.  Patient is oriented to person, place, time, and situation. AFFECT: pleasant, lucid thought and speech. ENT: Ears: EACs clear, normal epithelium.  TMs with good light reflex and landmarks bilaterally.  Eyes: no injection, icteris, swelling, or exudate.  EOMI, PERRLA. Nose: no drainage or turbinate edema/swelling.  No injection or focal lesion.  Mouth: lips without lesion/swelling.  Oral mucosa pink and moist.  Dentition intact and without obvious caries or gingival swelling.  Oropharynx without erythema, exudate, or swelling.  Neck: supple/nontender.  No LAD, mass, or TM.  Carotid pulses 2+ bilaterally, without bruits. CV: RRR, no m/r/g.   LUNGS: CTA bilat, nonlabored resps, good aeration in all lung fields. ABD: soft, NT, ND, BS normal.  No hepatospenomegaly or mass.  No bruits. EXT: no clubbing, cyanosis, or edema.   Musculoskeletal: no joint swelling, erythema, warmth, or tenderness.  ROM of all joints intact. Skin - no sores or  rashes or color changes.  Several atypical-appearing pigmented lesions on his back. Rectal exam: negative without mass, lesions or tenderness, PROSTATE EXAM: smooth and symmetric without nodules or tenderness.  Pertinent labs:  None today  ASSESSMENT AND PLAN:   New pt; obtain old PCP records.  Health maintenance exam: Reviewed age and gender appropriate health maintenance issues (prudent diet, regular exercise, health risks of tobacco and excessive alcohol, use of seatbelts, fire alarms in home, use of sunscreen).  Also reviewed age and gender appropriate health screening as well as vaccine recommendations. HP labs drawn today. UTD on colon cancer screening.  DRE normal today, PSA drawn. He is pursuing new neurologist for help with his chronic neck pain and his bilateral feet (idiopathic) PN,  and he'll call if he desires any help with this. Atypical pigmented skin lesions on pt's back: referred pt to Dr. Ledell Peoples office for further evaluation.   Return in about 1 year (around 12/21/2015) for annual CPE (fasting).

## 2014-12-21 NOTE — Progress Notes (Signed)
Pre visit review using our clinic review tool, if applicable. No additional management support is needed unless otherwise documented below in the visit note. 

## 2014-12-22 ENCOUNTER — Other Ambulatory Visit: Payer: Self-pay | Admitting: Neurology

## 2014-12-22 ENCOUNTER — Other Ambulatory Visit (INDEPENDENT_AMBULATORY_CARE_PROVIDER_SITE_OTHER): Payer: 59

## 2014-12-22 DIAGNOSIS — Z125 Encounter for screening for malignant neoplasm of prostate: Secondary | ICD-10-CM | POA: Diagnosis not present

## 2014-12-22 DIAGNOSIS — Z0000 Encounter for general adult medical examination without abnormal findings: Secondary | ICD-10-CM | POA: Diagnosis not present

## 2014-12-22 LAB — TSH: TSH: 2.6 u[IU]/mL (ref 0.35–4.50)

## 2014-12-22 LAB — LIPID PANEL
Cholesterol: 193 mg/dL (ref 0–200)
HDL: 64.8 mg/dL (ref >39.00)
LDL Cholesterol: 100 mg/dL — ABNORMAL HIGH (ref 0–99)
NONHDL: 128.2
Total CHOL/HDL Ratio: 3
Triglycerides: 143 mg/dL (ref 0.0–149.0)
VLDL: 28.6 mg/dL (ref 0.0–40.0)

## 2014-12-22 LAB — COMPREHENSIVE METABOLIC PANEL
ALBUMIN: 4.3 g/dL (ref 3.5–5.2)
ALK PHOS: 76 U/L (ref 39–117)
ALT: 34 U/L (ref 0–53)
AST: 18 U/L (ref 0–37)
BUN: 17 mg/dL (ref 6–23)
CO2: 31 meq/L (ref 19–32)
Calcium: 9.5 mg/dL (ref 8.4–10.5)
Chloride: 105 mEq/L (ref 96–112)
Creatinine, Ser: 1.08 mg/dL (ref 0.40–1.50)
GFR: 76.2 mL/min (ref >60.00)
Glucose, Bld: 85 mg/dL (ref 70–99)
Potassium: 4.7 mEq/L (ref 3.5–5.1)
Sodium: 141 mEq/L (ref 135–145)
Total Bilirubin: 0.7 mg/dL (ref 0.2–1.2)
Total Protein: 6.8 g/dL (ref 6.0–8.3)

## 2014-12-22 LAB — CBC WITH DIFFERENTIAL/PLATELET
BASOS ABS: 0 10*3/uL (ref 0.0–0.1)
BASOS PCT: 0.4 % (ref 0.0–3.0)
Eosinophils Absolute: 0.1 10*3/uL (ref 0.0–0.7)
Eosinophils Relative: 2.1 % (ref 0.0–5.0)
HCT: 49.3 % (ref 39.0–52.0)
Hemoglobin: 16.6 g/dL (ref 13.0–17.0)
LYMPHS ABS: 1.9 10*3/uL (ref 0.7–4.0)
LYMPHS PCT: 34.2 % (ref 12.0–46.0)
MCHC: 33.7 g/dL (ref 30.0–36.0)
MCV: 89.5 fl (ref 78.0–100.0)
MONOS PCT: 8.8 % (ref 3.0–12.0)
Monocytes Absolute: 0.5 10*3/uL (ref 0.1–1.0)
NEUTROS PCT: 54.5 % (ref 43.0–77.0)
Neutro Abs: 3.1 10*3/uL (ref 1.4–7.7)
Platelets: 172 10*3/uL (ref 150.0–400.0)
RBC: 5.51 Mil/uL (ref 4.22–5.81)
RDW: 14.2 % (ref 11.5–15.5)
WBC: 5.6 10*3/uL (ref 4.0–10.5)

## 2014-12-22 LAB — PSA: PSA: 1.72 ng/mL (ref 0.10–4.00)

## 2014-12-31 ENCOUNTER — Encounter: Payer: Self-pay | Admitting: Family Medicine

## 2015-01-19 ENCOUNTER — Other Ambulatory Visit: Payer: Self-pay | Admitting: Dermatology

## 2015-01-26 ENCOUNTER — Ambulatory Visit: Payer: 59 | Admitting: Neurology

## 2015-02-13 ENCOUNTER — Encounter: Payer: Self-pay | Admitting: Family Medicine

## 2015-03-07 ENCOUNTER — Encounter: Payer: Self-pay | Admitting: Family Medicine

## 2015-03-24 DIAGNOSIS — M479 Spondylosis, unspecified: Secondary | ICD-10-CM | POA: Insufficient documentation

## 2015-04-05 DIAGNOSIS — M4722 Other spondylosis with radiculopathy, cervical region: Secondary | ICD-10-CM | POA: Insufficient documentation

## 2015-04-05 DIAGNOSIS — M4802 Spinal stenosis, cervical region: Secondary | ICD-10-CM | POA: Insufficient documentation

## 2015-04-05 DIAGNOSIS — M5002 Cervical disc disorder with myelopathy, mid-cervical region, unspecified level: Secondary | ICD-10-CM | POA: Insufficient documentation

## 2015-04-05 HISTORY — PX: OTHER SURGICAL HISTORY: SHX169

## 2015-05-08 ENCOUNTER — Encounter: Payer: Self-pay | Admitting: Family Medicine

## 2015-06-14 ENCOUNTER — Other Ambulatory Visit: Payer: Self-pay | Admitting: Neurology

## 2015-09-01 ENCOUNTER — Ambulatory Visit: Payer: Self-pay | Admitting: Family Medicine

## 2015-09-01 ENCOUNTER — Ambulatory Visit (INDEPENDENT_AMBULATORY_CARE_PROVIDER_SITE_OTHER): Payer: Managed Care, Other (non HMO) | Admitting: Family Medicine

## 2015-09-01 ENCOUNTER — Encounter: Payer: Self-pay | Admitting: Family Medicine

## 2015-09-01 VITALS — BP 123/74 | HR 73 | Temp 98.2°F | Resp 16 | Ht 72.0 in | Wt 174.0 lb

## 2015-09-01 DIAGNOSIS — R0981 Nasal congestion: Secondary | ICD-10-CM | POA: Diagnosis not present

## 2015-09-01 DIAGNOSIS — D2330 Other benign neoplasm of skin of unspecified part of face: Secondary | ICD-10-CM | POA: Diagnosis not present

## 2015-09-01 DIAGNOSIS — G609 Hereditary and idiopathic neuropathy, unspecified: Secondary | ICD-10-CM | POA: Diagnosis not present

## 2015-09-01 DIAGNOSIS — D2230 Melanocytic nevi of unspecified part of face: Secondary | ICD-10-CM

## 2015-09-01 DIAGNOSIS — J312 Chronic pharyngitis: Secondary | ICD-10-CM

## 2015-09-01 MED ORDER — GABAPENTIN 300 MG PO CAPS: 300.0000 mg | ORAL_CAPSULE | Freq: Two times a day (BID) | ORAL | Status: DC

## 2015-09-01 NOTE — Progress Notes (Signed)
OFFICE VISIT  09/01/2015   CC:  Chief Complaint  Patient presents with  . Sore Throat    and congestion  . Nevus    on face  . Referral    wants referral to neurology due to non-diabetic neuropathy   HPI:    Patient is a 55 y.o. Caucasian male who presents for 1) sore throat  2) Mole on face   3) desires referral to neurologist for non-diabetic/idiapathic polyneuropathy.  ST is daily for "years", wakes up with it every morning and feels like he has to clear phlegm/mucous out of it for a while.  Hx of GERD and LPR, not on PPI and H2 blockers b/c he doesn't want to take these due to fear of potential long term side effects.  Changed diet and this helped alleviate his GER but he feels like LPR has continued.    Also with several months of nasal congestion/stuffy nose, PND in the morning.  No cough, no facial pain or pressure, no sneezing, no fevers or HAs.  Says he does not feel like he has a sinus infection.  No   wheezing.  No skin rashes.  No environmental or other type allergens identified.  He did not bring the information necessary for me to help him get referred to neurologist for his idiopathic PN, so he'll have to call back with this info.    Past Medical History  Diagnosis Date  . Peripheral neuropathy ([place])   . Glaucoma   . BPH (benign prostatic hypertrophy)   . Cervical spondylosis     with radiculopathy not responsive to conservative therapies:  [facility] 02/2015: ACDF surgery C5-C7 performed 04/05/15  . Lateral epicondylitis of left elbow   . Restless legs syndrome 09/27/2014  . GERD (gastroesophageal reflux disease)   . History of adenomatous polyp of colon     Past Surgical History  Procedure Laterality Date  . Vasectomy    . Trabeculectomy      for uncontrolled glaucoma R eye  . Cataract extraction    . Transurethral resection of prostate      due to enlarged prostate  . Eye surgery Right   . Colonoscopy w/ polypectomy  08/2012    recall 3  yrs  . Ant cerv decompression/discectomy w/fusion c 5-7  04/05/15    C5-7 ACDF    Outpatient Prescriptions Prior to Visit  Medication Sig Dispense Refill  . bimatoprost (LUMIGAN) 0.01 % SOLN Place 1 drop into the left eye at bedtime.     . Coenzyme Q10 (CO Q 10) 100 MG CAPS Take 100 mg by mouth daily.    . Multiple Vitamin (MULTIVITAMIN) tablet Take 1 tablet by mouth daily.    . prednisoLONE acetate (PRED FORTE) 1 % ophthalmic suspension Apply to eye.    . Probiotic Product (PROBIOTIC DAILY PO) Take 2 tablets by mouth daily.     Marland Kitchen SIMBRINZA 1-0.2 % SUSP Place 1 drop into the left eye 2 (two) times daily.    . nortriptyline (PAMELOR) 10 MG capsule TAKE 3 CAPSULES AT NIGHT (Patient not taking: Reported on 09/01/2015) 270 capsule 0  . polyethylene glycol (MIRALAX / GLYCOLAX) packet Take 17 g by mouth daily. Reported on 09/01/2015     No facility-administered medications prior to visit.    Allergies  Allergen Reactions  . Gabapentin     tired    ROS As per HPI  PE: Blood pressure 123/74, pulse 73, temperature 98.2 F (36.8 C), temperature  source Oral, resp. rate 16, height 6' (1.829 m), weight 174 lb (78.926 kg), SpO2 95 %. Gen: Alert, well appearing.  Patient is oriented to person, place, time, and situation. AFFECT: pleasant, lucid thought and speech. HEENT: eyes without injection, drainage, or swelling.  Ears: EACs clear, TMs with normal light reflex and landmarks.  Nose: No active rhinorrhea.  There is minimal dried, crusty exudate adherent to mildly injected mucosa.  No purulent d/c.  No paranasal sinus TTP.  No facial swelling.  Throat and mouth without focal lesion.  No pharyngial swelling, erythema, or exudate.  No cobblestoning of pharyngeal mucosa. Neck: supple, no LAD.   LUNGS: CTA bilat, nonlabored resps.   CV: RRR, no m/r/g. EXT: no c/c/e SKIN: right upper cheek region with approx 2 mm light brown nevus with a small area of crusty texture.  LABS:  none  IMPRESSION  AND PLAN:  1) Chronic sore throat: suspect multifactorial (PND + LPR):  He wanted to do a trial of daily flonase to see if this helped his nasal congestion AND his throat. He is considering a trial of pepcid hs IF this trial of flonase does not help.  He declined referral to gastroenterologist.  As stated in HPI, pt declines to try PPI.  2) Nasal congestion: suspect nasal allergy, see #1 above.  3) Atypical nevus on face: pt did not want to go back to Dr. Allyson Sabal, requested referral to new dermatologist so I referred him to Dr. Denna Haggard today.  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  He has been working on getting to a new neurologist since I last saw him 12/2014.  He'll call back with specifics about who/where he would like new neuro referral. In the meantime, I refilled his gabapentin 300mg  bid for 30d supply with 1 additional RF.  An After Visit Summary was printed and given to the patient.  FOLLOW UP: Return if symptoms worsen or fail to improve.

## 2015-09-01 NOTE — Progress Notes (Signed)
Pre visit review using our clinic review tool, if applicable. No additional management support is needed unless otherwise documented below in the visit note. 

## 2015-09-04 ENCOUNTER — Telehealth: Payer: Self-pay | Admitting: Family Medicine

## 2015-09-04 DIAGNOSIS — G609 Hereditary and idiopathic neuropathy, unspecified: Secondary | ICD-10-CM

## 2015-09-04 NOTE — Telephone Encounter (Signed)
Patient called back to give neurologist info for the referral. Would like to see Dr. Karl Luke [facility] in [facility]

## 2015-09-04 NOTE — Telephone Encounter (Signed)
Please advise. Thanks.  

## 2015-09-05 NOTE — Telephone Encounter (Signed)
Pt advised and voiced understanding.   

## 2015-09-05 NOTE — Telephone Encounter (Signed)
Neurology referral ordered as per pt request. 

## 2015-09-05 NOTE — Telephone Encounter (Signed)
Left message for pt to call back  °

## 2015-09-14 ENCOUNTER — Encounter: Payer: Self-pay | Admitting: Family Medicine

## 2015-09-20 ENCOUNTER — Encounter: Payer: Self-pay | Admitting: Family Medicine

## 2015-09-25 ENCOUNTER — Other Ambulatory Visit: Payer: Self-pay

## 2015-09-28 ENCOUNTER — Encounter: Payer: Self-pay | Admitting: Family Medicine

## 2015-09-28 ENCOUNTER — Other Ambulatory Visit: Payer: Self-pay | Admitting: Family Medicine

## 2015-09-28 DIAGNOSIS — K219 Gastro-esophageal reflux disease without esophagitis: Secondary | ICD-10-CM

## 2015-09-28 DIAGNOSIS — R0989 Other specified symptoms and signs involving the circulatory and respiratory systems: Secondary | ICD-10-CM

## 2015-09-28 DIAGNOSIS — J312 Chronic pharyngitis: Secondary | ICD-10-CM

## 2015-09-28 DIAGNOSIS — R0981 Nasal congestion: Secondary | ICD-10-CM

## 2015-09-28 NOTE — Telephone Encounter (Signed)
Please advise. Thanks.  

## 2015-09-29 ENCOUNTER — Encounter: Payer: Self-pay | Admitting: Family Medicine

## 2015-10-23 ENCOUNTER — Telehealth: Payer: Self-pay | Admitting: Neurology

## 2015-10-23 DIAGNOSIS — D239 Other benign neoplasm of skin, unspecified: Secondary | ICD-10-CM

## 2015-10-23 HISTORY — DX: Other benign neoplasm of skin, unspecified: D23.9

## 2015-10-23 NOTE — Telephone Encounter (Signed)
I left message, for patient to stop by the office to sign a release form. Patient copy of [facility] is at the front desk.

## 2015-10-23 NOTE — Telephone Encounter (Signed)
Patient called to request [facility] records. Would like to take care of this today.

## 2015-10-24 DIAGNOSIS — K219 Gastro-esophageal reflux disease without esophagitis: Secondary | ICD-10-CM | POA: Insufficient documentation

## 2015-10-24 DIAGNOSIS — J342 Deviated nasal septum: Secondary | ICD-10-CM | POA: Insufficient documentation

## 2015-10-25 ENCOUNTER — Encounter: Payer: Self-pay | Admitting: Family Medicine

## 2015-11-09 ENCOUNTER — Encounter: Payer: Self-pay | Admitting: Family Medicine

## 2015-11-21 ENCOUNTER — Other Ambulatory Visit: Payer: Self-pay | Admitting: *Deleted

## 2015-11-21 MED ORDER — GABAPENTIN 300 MG PO CAPS: 300.0000 mg | ORAL_CAPSULE | Freq: Two times a day (BID) | ORAL | Status: DC

## 2015-11-21 NOTE — Telephone Encounter (Signed)
RF request for gabapentin LOV: 09/01/15 Next ov: None Last written: 09/01/15 #60 w/ 1RF  Please advise.Thanks.

## 2015-11-28 DIAGNOSIS — G609 Hereditary and idiopathic neuropathy, unspecified: Secondary | ICD-10-CM | POA: Insufficient documentation

## 2015-11-28 DIAGNOSIS — M542 Cervicalgia: Secondary | ICD-10-CM | POA: Insufficient documentation

## 2015-11-28 DIAGNOSIS — M47816 Spondylosis without myelopathy or radiculopathy, lumbar region: Secondary | ICD-10-CM | POA: Insufficient documentation

## 2015-11-28 DIAGNOSIS — M545 Low back pain, unspecified: Secondary | ICD-10-CM | POA: Insufficient documentation

## 2015-11-30 ENCOUNTER — Telehealth: Payer: Self-pay | Admitting: Neurology

## 2015-11-30 NOTE — Telephone Encounter (Signed)
sent medical records to [facility]

## 2015-12-12 ENCOUNTER — Encounter: Payer: Self-pay | Admitting: Family Medicine

## 2015-12-12 ENCOUNTER — Ambulatory Visit (INDEPENDENT_AMBULATORY_CARE_PROVIDER_SITE_OTHER): Payer: Managed Care, Other (non HMO) | Admitting: Family Medicine

## 2015-12-12 ENCOUNTER — Other Ambulatory Visit: Payer: Self-pay | Admitting: *Deleted

## 2015-12-12 VITALS — BP 115/71 | HR 53 | Temp 97.8°F | Resp 16 | Ht 73.0 in | Wt 170.2 lb

## 2015-12-12 DIAGNOSIS — Z125 Encounter for screening for malignant neoplasm of prostate: Secondary | ICD-10-CM | POA: Diagnosis not present

## 2015-12-12 DIAGNOSIS — Z1159 Encounter for screening for other viral diseases: Secondary | ICD-10-CM

## 2015-12-12 DIAGNOSIS — Z0000 Encounter for general adult medical examination without abnormal findings: Secondary | ICD-10-CM | POA: Diagnosis not present

## 2015-12-12 LAB — LIPID PANEL
CHOLESTEROL: 151 mg/dL (ref 125–200)
HDL: 67 mg/dL (ref >=40)
LDL Cholesterol: 52 mg/dL (ref <130)
TRIGLYCERIDES: 159 mg/dL — AB (ref <150)
Total CHOL/HDL Ratio: 2.3 Ratio (ref <=5.0)
VLDL: 32 mg/dL — AB (ref <30)

## 2015-12-12 LAB — COMPREHENSIVE METABOLIC PANEL
ALK PHOS: 65 U/L (ref 40–115)
ALT: 17 U/L (ref 9–46)
AST: 17 U/L (ref 10–35)
Albumin: 4.2 g/dL (ref 3.6–5.1)
BILIRUBIN TOTAL: 0.7 mg/dL (ref 0.2–1.2)
BUN: 15 mg/dL (ref 7–25)
CALCIUM: 8.8 mg/dL (ref 8.6–10.3)
CO2: 26 mmol/L (ref 20–31)
Chloride: 106 mmol/L (ref 98–110)
Creat: 1.13 mg/dL (ref 0.70–1.33)
GLUCOSE: 83 mg/dL (ref 65–99)
POTASSIUM: 4.2 mmol/L (ref 3.5–5.3)
Sodium: 139 mmol/L (ref 135–146)
TOTAL PROTEIN: 6.4 g/dL (ref 6.1–8.1)

## 2015-12-12 LAB — CBC WITH DIFFERENTIAL/PLATELET
BASOS ABS: 0 {cells}/uL (ref 0–200)
Basophils Relative: 0 %
EOS PCT: 3 %
Eosinophils Absolute: 138 cells/uL (ref 15–500)
HCT: 46.7 % (ref 38.5–50.0)
HEMOGLOBIN: 15.3 g/dL (ref 13.2–17.1)
LYMPHS ABS: 1518 {cells}/uL (ref 850–3900)
Lymphocytes Relative: 33 %
MCH: 29.7 pg (ref 27.0–33.0)
MCHC: 32.8 g/dL (ref 32.0–36.0)
MCV: 90.7 fL (ref 80.0–100.0)
MONOS PCT: 9 %
MPV: 10.7 fL (ref 7.5–12.5)
Monocytes Absolute: 414 cells/uL (ref 200–950)
NEUTROS ABS: 2530 {cells}/uL (ref 1500–7800)
Neutrophils Relative %: 55 %
PLATELETS: 185 10*3/uL (ref 140–400)
RBC: 5.15 MIL/uL (ref 4.20–5.80)
RDW: 13.8 % (ref 11.0–15.0)
WBC: 4.6 10*3/uL (ref 3.8–10.8)

## 2015-12-12 LAB — TSH: TSH: 2.4 m[IU]/L (ref 0.40–4.50)

## 2015-12-12 MED ORDER — GABAPENTIN 300 MG PO CAPS: ORAL_CAPSULE | ORAL | Status: DC

## 2015-12-12 NOTE — Progress Notes (Signed)
Pre visit review using our clinic review tool, if applicable. No additional management support is needed unless otherwise documented below in the visit note. 

## 2015-12-12 NOTE — Telephone Encounter (Signed)
RF request for gabapentin LOV: 12/12/15 Next ov: None Last written: 11/21/15 #60 w/ 3RF  Pt requesting new Rx because his neurologist changed his dose to 3 capsules daily and pharmacy will not fill Rx until they receive new Rx with new sig (this has been changed, Rx just needs to be approved). Please advise. Thanks.

## 2015-12-12 NOTE — Patient Instructions (Signed)
Call [facility] to schedule a repeat colonoscopy.

## 2015-12-12 NOTE — Telephone Encounter (Signed)
Pt advised and voiced understanding.   

## 2015-12-12 NOTE — Progress Notes (Signed)
Office Note 12/12/2015  CC:  Chief Complaint  Patient presents with  . Annual Exam    Pt is fasting.     HPI:  Keith Mckee is a 55 y.o. White male who is here for annual health maintenance exam. Exercising regularly. Eye exam UTD.   He asks that I look at his R eye today b/c it feels irritated still from having gotten a rust shaving in it about a week ago.   Past Medical History  Diagnosis Date  . Peripheral neuropathy ([place])   . Glaucoma   . BPH (benign prostatic hypertrophy)   . Cervical spondylosis     with radiculopathy not responsive to conservative therapies:  [facility] 02/2015: ACDF surgery C5-C7 performed 04/05/15  . Lateral epicondylitis of left elbow   . Restless legs syndrome 09/27/2014  . GERD (gastroesophageal reflux disease)   . History of adenomatous polyp of colon   . Throat irritation     chronic (since cervical spine surgery 2016).  Dr. Erik Obey is working him up as of 10/2015 (laryngoscopy normal; trial of protonix started)  . Dysplastic nevus 10/2015    left mid back: Dr. Denna Haggard following    Past Surgical History  Procedure Laterality Date  . Vasectomy    . Trabeculectomy      for uncontrolled glaucoma R eye  . Cataract extraction    . Transurethral resection of prostate      due to enlarged prostate  . Eye surgery Right   . Colonoscopy w/ polypectomy  08/2012    recall 3 yrs  . Ant cerv decompression/discectomy w/fusion c 5-7  04/05/15    C5-7 ACDF    Family History  Problem Relation Age of Onset  . Glaucoma Father   . Heart disease Father   . Neuropathy Father     Peripheral neuropathy  . Arthritis Father   . Parkinson's disease Father     Social History   Social History  . Marital Status: Married    Spouse Name: N/A  . Number of Children: 2  . Years of Education: Master D.   Occupational History  . 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 Smoking status: Former Smoker -- 0.75 packs/day for 15 years    Types: Cigarettes    Quit date: 06/24/1998  . Smokeless tobacco: Never Used     Comment: 2 pack per week  . Alcohol Use: 7.2 oz/week    12 Cans of beer per week     Comment: Patient drinks 12 beers a week  . Drug Use: No  . Sexual Activity: Not on file   Other Topics Concern  . Not on file   Social History Narrative   Married.   Educ: MBA   Occupation: Real Product manager   No tob, occ alc, no drugs.   Exercises regularly.   Patient is right handed.   Patient drinks 1 large cup of coffee daily.    Outpatient Prescriptions Prior to Visit  Medication Sig Dispense Refill  . bimatoprost (LUMIGAN) 0.01 % SOLN Place 1 drop into the left eye at bedtime.     . Coenzyme Q10 (CO Q 10) 100 MG CAPS Take 100 mg by mouth daily.    Marland Kitchen gabapentin (NEURONTIN) 300 MG capsule Take 1 capsule (300 mg total) by mouth 2 (two) times daily. 60 capsule 3  . Multiple  Vitamin (MULTIVITAMIN) tablet Take 1 tablet by mouth daily.    . prednisoLONE acetate (PRED FORTE) 1 % ophthalmic suspension Apply to eye.    . Probiotic Product (PROBIOTIC DAILY PO) Take 2 tablets by mouth daily.     Marland Kitchen SIMBRINZA 1-0.2 % SUSP Place 1 drop into the left eye 2 (two) times daily.     No facility-administered medications prior to visit.    Allergies  Allergen Reactions  . Gabapentin     tired    ROS Review of Systems  Constitutional: Negative for fever, chills, appetite change and fatigue.  HENT: Negative for congestion, dental problem, ear pain and sore throat.   Eyes: Negative for photophobia, discharge, redness and visual disturbance.  Respiratory: Negative for cough, chest tightness, shortness of breath and wheezing.   Cardiovascular: Negative for chest pain, palpitations and leg swelling.  Gastrointestinal: Negative for nausea, vomiting, abdominal pain, diarrhea and blood in stool.  Genitourinary: Negative for dysuria, urgency,  frequency, hematuria, flank pain and difficulty urinating.  Musculoskeletal: Negative for myalgias, back pain, joint swelling, arthralgias and neck stiffness.  Skin: Negative for pallor and rash.  Neurological: Negative for dizziness, speech difficulty, weakness and headaches.  Hematological: Negative for adenopathy. Does not bruise/bleed easily.  Psychiatric/Behavioral: Negative for confusion and sleep disturbance. The patient is not nervous/anxious.     PE; Blood pressure 115/71, pulse 53, temperature 97.8 F (36.6 C), temperature source Oral, resp. rate 16, height 6\' 1"  (1.854 m), weight 170 lb 4 oz (77.225 kg), SpO2 99 %. Gen: Alert, well appearing.  Patient is oriented to person, place, time, and situation. AFFECT: pleasant, lucid thought and speech. ENT: Ears: EACs clear, normal epithelium.  TMs with good light reflex and landmarks bilaterally.  Eyes: no injection, icteris, swelling, or exudate.  EOMI, PERRLA. Nose: no drainage or turbinate edema/swelling.  No injection or focal lesion.  Mouth: lips without lesion/swelling.  Oral mucosa pink and moist.  Dentition intact and without obvious caries or gingival swelling.  Oropharynx without erythema, exudate, or swelling.  Neck: supple/nontender.  No LAD, mass, or TM.  Carotid pulses 2+ bilaterally, without bruits. CV: RRR, no m/r/g.   LUNGS: CTA bilat, nonlabored resps, good aeration in all lung fields. ABD: soft, NT, ND, BS normal.  No hepatospenomegaly or mass.  No bruits. EXT: no clubbing, cyanosis, or edema.  Musculoskeletal: no joint swelling, erythema, warmth, or tenderness.  ROM of all joints intact. Skin - no sores or suspicious lesions or rashes or color changes Rectal exam: negative without mass, lesions or tenderness, PROSTATE EXAM: smooth and symmetric without nodules or tenderness.  Pertinent labs:  Lab Results  Component Value Date   TSH 2.60 12/22/2014   Lab Results  Component Value Date   WBC 5.6 12/22/2014   HGB  16.6 12/22/2014   HCT 49.3 12/22/2014   MCV 89.5 12/22/2014   PLT 172.0 12/22/2014   Lab Results  Component Value Date   CREATININE 1.08 12/22/2014   BUN 17 12/22/2014   NA 141 12/22/2014   K 4.7 12/22/2014   CL 105 12/22/2014   CO2 31 12/22/2014   Lab Results  Component Value Date   ALT 34 12/22/2014   AST 18 12/22/2014   ALKPHOS 76 12/22/2014   BILITOT 0.7 12/22/2014   Lab Results  Component Value Date   CHOL 193 12/22/2014   Lab Results  Component Value Date   HDL 64.80 12/22/2014   Lab Results  Component Value Date   LDLCALC 100* 12/22/2014  Lab Results  Component Value Date   TRIG 143.0 12/22/2014   Lab Results  Component Value Date   CHOLHDL 3 12/22/2014   Lab Results  Component Value Date   PSA 1.72 12/22/2014    ASSESSMENT AND PLAN:   Health maintenance exam: Reviewed age and gender appropriate health maintenance issues (prudent diet, regular exercise, health risks of tobacco and excessive alcohol, use of seatbelts, fire alarms in home, use of sunscreen).  Also reviewed age and gender appropriate health screening as well as vaccine recommendations. HP labs + Hep C screening done today.  He declined HIV screening. Prostate cancer screening: DRE normal, PSA drawn. Colon cancer screening: pt needs to call his GI office (he states this is [facility]) to schedule repeat colonoscopy to f/u hx of adenomatous colon polyps. I could not see anything abnormal in R eye today.  I encouraged him to f/u with his ophthalmologist if his irritation persisted.  He expressed understanding.  An After Visit Summary was printed and given to the patient.  FOLLOW UP:  Return in about 1 year (around 12/11/2016) for annual CPE (fasting).  Signed:  Crissie Sickles, MD           12/12/2015

## 2015-12-13 LAB — HEPATITIS C ANTIBODY: HCV Ab: NEGATIVE

## 2015-12-13 LAB — PSA: PSA: 1.71 ng/mL (ref <=4.00)

## 2015-12-21 ENCOUNTER — Encounter: Payer: 59 | Admitting: Family Medicine

## 2016-01-25 ENCOUNTER — Other Ambulatory Visit: Payer: Self-pay | Admitting: Otolaryngology

## 2016-01-25 DIAGNOSIS — J029 Acute pharyngitis, unspecified: Secondary | ICD-10-CM

## 2016-01-29 ENCOUNTER — Inpatient Hospital Stay: Admission: RE | Admit: 2016-01-29 | Payer: Managed Care, Other (non HMO) | Source: Ambulatory Visit

## 2016-02-02 ENCOUNTER — Ambulatory Visit
Admission: RE | Admit: 2016-02-02 | Discharge: 2016-02-02 | Disposition: A | Discharge status: Home / Self Care | Payer: Managed Care, Other (non HMO) | Source: Ambulatory Visit | Attending: Otolaryngology | Admitting: Otolaryngology

## 2016-02-02 DIAGNOSIS — J029 Acute pharyngitis, unspecified: Secondary | ICD-10-CM

## 2016-02-08 ENCOUNTER — Telehealth: Payer: Self-pay | Admitting: Gastroenterology

## 2016-02-08 NOTE — Telephone Encounter (Signed)
Records received from previous GI Dr. And placed on Dr. Woodward Ku desk for review

## 2016-02-09 ENCOUNTER — Encounter: Payer: Self-pay | Admitting: Gastroenterology

## 2016-02-09 NOTE — Telephone Encounter (Signed)
Dr.Nandigam has reviewed records and has accepted patient. I have left a voicemail for patient to call back to schedule direct colonoscopy. Records will be in records file folder.

## 2016-02-12 ENCOUNTER — Other Ambulatory Visit: Payer: Self-pay | Admitting: *Deleted

## 2016-02-12 MED ORDER — GABAPENTIN 300 MG PO CAPS: ORAL_CAPSULE | ORAL | 6 refills | Status: DC

## 2016-02-12 NOTE — Telephone Encounter (Signed)
[employer] requesting 90 day supply.  RF request for gabapentin LOV: 12/12/15 Next ov: 12/12/16 Last written: 12/12/15 #90 w/ 6RF  Please advise. Thanks.

## 2016-04-10 ENCOUNTER — Ambulatory Visit (AMBULATORY_SURGERY_CENTER): Payer: Self-pay | Admitting: *Deleted

## 2016-04-10 VITALS — Ht 72.0 in | Wt 169.0 lb

## 2016-04-10 DIAGNOSIS — Z86010 Personal history of colonic polyps: Secondary | ICD-10-CM

## 2016-04-10 MED ORDER — NA SULFATE-K SULFATE-MG SULF 17.5-3.13-1.6 GM/177ML PO SOLN: 1.0000 | Freq: Once | ORAL | 0 refills | Status: AC

## 2016-04-10 NOTE — Progress Notes (Signed)
No allergies to eggs or soy. No problems with anesthesia.  Pt not given Emmi instructions for colonoscopy  No oxygen use  No diet drug use

## 2016-04-24 ENCOUNTER — Encounter: Payer: Managed Care, Other (non HMO) | Admitting: Gastroenterology

## 2016-05-10 ENCOUNTER — Encounter: Payer: Self-pay | Admitting: Gastroenterology

## 2016-05-23 ENCOUNTER — Encounter: Payer: Self-pay | Admitting: Gastroenterology

## 2016-05-23 ENCOUNTER — Ambulatory Visit (AMBULATORY_SURGERY_CENTER): Payer: Managed Care, Other (non HMO) | Admitting: Gastroenterology

## 2016-05-23 VITALS — BP 105/68 | HR 53 | Temp 97.5°F | Resp 10 | Ht 72.0 in | Wt 169.0 lb

## 2016-05-23 DIAGNOSIS — Z1211 Encounter for screening for malignant neoplasm of colon: Secondary | ICD-10-CM

## 2016-05-23 DIAGNOSIS — K621 Rectal polyp: Secondary | ICD-10-CM | POA: Diagnosis not present

## 2016-05-23 DIAGNOSIS — D128 Benign neoplasm of rectum: Secondary | ICD-10-CM

## 2016-05-23 DIAGNOSIS — D122 Benign neoplasm of ascending colon: Secondary | ICD-10-CM | POA: Diagnosis not present

## 2016-05-23 DIAGNOSIS — D129 Benign neoplasm of anus and anal canal: Secondary | ICD-10-CM

## 2016-05-23 DIAGNOSIS — Z1212 Encounter for screening for malignant neoplasm of rectum: Secondary | ICD-10-CM | POA: Diagnosis not present

## 2016-05-23 MED ORDER — SODIUM CHLORIDE 0.9 % IV SOLN: 500.0000 mL | INTRAVENOUS | Status: DC

## 2016-05-23 NOTE — Progress Notes (Signed)
Called to room to assist during endoscopic procedure.  Patient ID and intended procedure confirmed with present staff. Received instructions for my participation in the procedure from the performing physician.  

## 2016-05-23 NOTE — Op Note (Signed)
[facility] Patient Name: Keith Mckee Procedure Date: 05/23/2016 7:57 AM MRN: [id number] Endoscopist: Mauri Pole , MD Age: 55 Referring MD:  Date of Birth: Jan 14, 1961 Gender: Male Account #: [id number] Procedure:                Colonoscopy Indications:              Screening for colorectal malignant neoplasm Medicines:                Monitored Anesthesia Care Procedure:                Pre-Anesthesia Assessment:                           - Prior to the procedure, a History and Physical                            was performed, and patient medications and                            allergies were reviewed. The patient's tolerance of                            previous anesthesia was also reviewed. The risks                            and benefits of the procedure and the sedation                            options and risks were discussed with the patient.                            All questions were answered, and informed consent                            was obtained. Prior Anticoagulants: The patient has                            taken no previous anticoagulant or antiplatelet                            agents. ASA Grade Assessment: II - A patient with                            mild systemic disease. After reviewing the risks                            and benefits, the patient was deemed in                            satisfactory condition to undergo the procedure.                           After obtaining informed consent, the colonoscope  was passed under direct vision. Throughout the                            procedure, the patient's blood pressure, pulse, and                            oxygen saturations were monitored continuously. The                            Model CF-HQ190L [id number]) scope was introduced                            through the anus and advanced to the the terminal                            ileum,  with identification of the appendiceal                            orifice and IC valve. The colonoscopy was performed                            without difficulty. The patient tolerated the                            procedure well. The quality of the bowel                            preparation was excellent. The terminal ileum,                            ileocecal valve, appendiceal orifice, and rectum                            were photographed. Scope In: 8:15:48 AM Scope Out: 8:33:26 AM Scope Withdrawal Time: 0 hours 11 minutes 4 seconds  Total Procedure Duration: 0 hours 17 minutes 38 seconds  Findings:                 The perianal and digital rectal examinations were                            normal.                           A 2 mm polyp was found in the ascending colon. The                            polyp was sessile. The polyp was removed with a                            cold biopsy forceps. Resection and retrieval were                            complete.  A 6 mm polyp was found in the rectum. The polyp was                            sessile. The polyp was removed with a cold snare.                            Resection and retrieval were complete.                           Non-bleeding internal hemorrhoids were found during                            retroflexion. The hemorrhoids were small. Complications:            No immediate complications. Estimated Blood Loss:     Estimated blood loss was minimal. Impression:               - One 2 mm polyp in the ascending colon, removed                            with a cold biopsy forceps. Resected and retrieved.                           - One 6 mm polyp in the rectum, removed with a cold                            snare. Resected and retrieved.                           - Non-bleeding internal hemorrhoids. Recommendation:           - Patient has a contact number available for                             emergencies. The signs and symptoms of potential                            delayed complications were discussed with the                            patient. Return to normal activities tomorrow.                            Written discharge instructions were provided to the                            patient.                           - Resume previous diet.                           - Continue present medications.                           - Await  pathology results.                           - Repeat colonoscopy in 5-10 years for surveillance                            based on pathology results. Mauri Pole, MD 05/23/2016 8:40:39 AM This report has been signed electronically.

## 2016-05-23 NOTE — Patient Instructions (Signed)
Findings:  Polyps, internal Hemorrhoids  Recommendations:  Resume previous diet, Continue previous medications, Wait for pathology results, Repeat colonoscopy in 5-10 years depending on pathology.    YOU HAD AN ENDOSCOPIC PROCEDURE TODAY AT [facility]:   Refer to the procedure report that was given to you for any specific questions about what was found during the examination.  If the procedure report does not answer your questions, please call your gastroenterologist to clarify.  If you requested that your care partner not be given the details of your procedure findings, then the procedure report has been included in a sealed envelope for you to review at your convenience later.  YOU SHOULD EXPECT: Some feelings of bloating in the abdomen. Passage of more gas than usual.  Walking can help get rid of the air that was put into your GI tract during the procedure and reduce the bloating. If you had a lower endoscopy (such as a colonoscopy or flexible sigmoidoscopy) you may notice spotting of blood in your stool or on the toilet paper. If you underwent a bowel prep for your procedure, you may not have a normal bowel movement for a few days.  Please Note:  You might notice some irritation and congestion in your nose or some drainage.  This is from the oxygen used during your procedure.  There is no need for concern and it should clear up in a day or so.  SYMPTOMS TO REPORT IMMEDIATELY:   Following lower endoscopy (colonoscopy or flexible sigmoidoscopy):  Excessive amounts of blood in the stool  Significant tenderness or worsening of abdominal pains  Swelling of the abdomen that is new, acute  Fever of 100F or higher   Following upper endoscopy (EGD)  Vomiting of blood or coffee ground material  New chest pain or pain under the shoulder blades  Painful or persistently difficult swallowing  New shortness of breath  Fever of 100F or higher  Black, tarry-looking stools  For  urgent or emergent issues, a gastroenterologist can be reached at any hour by calling [phone].   DIET:  We do recommend a small meal at first, but then you may proceed to your regular diet.  Drink plenty of fluids but you should avoid alcoholic beverages for 24 hours.  ACTIVITY:  You should plan to take it easy for the rest of today and you should NOT DRIVE or use heavy machinery until tomorrow (because of the sedation medicines used during the test).    FOLLOW UP: Our staff will call the number listed on your records the next business day following your procedure to check on you and address any questions or concerns that you may have regarding the information given to you following your procedure. If we do not reach you, we will leave a message.  However, if you are feeling well and you are not experiencing any problems, there is no need to return our call.  We will assume that you have returned to your regular daily activities without incident.  If any biopsies were taken you will be contacted by phone or by letter within the next 1-3 weeks.  Please call us at [phone] if you have not heard about the biopsies in 3 weeks.    SIGNATURES/CONFIDENTIALITY: You and/or your care partner have signed paperwork which will be entered into your electronic medical record.  These signatures attest to the fact that that the information above on your After Visit Summary has been reviewed and is  understood.  Full responsibility of the confidentiality of this discharge information lies with you and/or your care-partner. 

## 2016-05-23 NOTE — Progress Notes (Signed)
A/ox3 pleased with MAC, report to Tracy RN 

## 2016-05-24 ENCOUNTER — Telehealth: Payer: Self-pay

## 2016-05-24 NOTE — Telephone Encounter (Signed)
Attempted to reach pt. At home and mobile no., and no. Given prior to procedure as a contact number.  Repeated redials yielded no ring tones.

## 2016-05-24 NOTE — Telephone Encounter (Signed)
Attempted to reach pt. With follow up call following procedure yesterday.   LM on pt.'s ans. Machine to call if he has any questions or concerns.

## 2016-06-03 ENCOUNTER — Encounter: Payer: Self-pay | Admitting: Gastroenterology

## 2016-06-04 ENCOUNTER — Encounter: Payer: Self-pay | Admitting: Family Medicine

## 2016-07-04 ENCOUNTER — Encounter: Payer: Self-pay | Admitting: Family Medicine

## 2016-08-02 ENCOUNTER — Encounter: Payer: Self-pay | Admitting: Family Medicine

## 2016-08-02 NOTE — Telephone Encounter (Signed)
Please advise. Thanks.  

## 2016-08-04 ENCOUNTER — Other Ambulatory Visit: Payer: Self-pay | Admitting: Family Medicine

## 2016-08-04 DIAGNOSIS — G609 Hereditary and idiopathic neuropathy, unspecified: Secondary | ICD-10-CM

## 2016-08-06 ENCOUNTER — Other Ambulatory Visit (INDEPENDENT_AMBULATORY_CARE_PROVIDER_SITE_OTHER): Payer: Managed Care, Other (non HMO) | Admitting: Family Medicine

## 2016-08-06 DIAGNOSIS — G609 Hereditary and idiopathic neuropathy, unspecified: Secondary | ICD-10-CM

## 2016-08-06 LAB — VITAMIN B12: VITAMIN B 12: 668 pg/mL (ref 211–911)

## 2016-08-06 NOTE — Progress Notes (Signed)
Patient presents today for labs per Mychart conversation.  Patient tolerated well.

## 2016-08-07 LAB — ANGIOTENSIN CONVERTING ENZYME: ANGIOTENSIN-CONVERTING ENZYME: 48 U/L (ref 9–67)

## 2016-08-09 LAB — METHYLMALONIC ACID, SERUM: METHYLMALONIC ACID, QUANT: 122 nmol/L (ref 87–318)

## 2016-08-10 LAB — VITAMIN B6: VITAMIN B6: 65.6 ng/mL — AB (ref 2.1–21.7)

## 2016-08-10 LAB — VITAMIN B1: Vitamin B1 (Thiamine): 20 nmol/L (ref 8–30)

## 2016-08-10 LAB — VITAMIN E
Gamma-Tocopherol (Vit E): <1 mg/L (ref <=4.3)
Vitamin E (Alpha Tocopherol): 21 mg/L — ABNORMAL HIGH (ref 5.7–19.9)

## 2016-08-14 ENCOUNTER — Encounter: Payer: Self-pay | Admitting: Family Medicine

## 2016-08-19 ENCOUNTER — Encounter: Payer: Self-pay | Admitting: Family Medicine

## 2016-08-19 ENCOUNTER — Telehealth: Payer: Self-pay | Admitting: Family Medicine

## 2016-08-19 NOTE — Telephone Encounter (Signed)
Patient called seeking test results discussion with provider. Stated provider would call on Friday. Asked to be called anytime in the afternoon, on his cellphone. [phone]

## 2016-08-20 NOTE — Telephone Encounter (Signed)
I spoke with Dr Anitra Lauth. If pt would like to discuss his medication and lab results then he will need to schedule an appointment. Pt was upset by this and said he would call back to make an appt as he did not have his calendar available.

## 2016-08-21 ENCOUNTER — Ambulatory Visit (INDEPENDENT_AMBULATORY_CARE_PROVIDER_SITE_OTHER): Payer: Managed Care, Other (non HMO) | Admitting: Family Medicine

## 2016-08-21 ENCOUNTER — Encounter: Payer: Self-pay | Admitting: Family Medicine

## 2016-08-21 VITALS — BP 108/72 | HR 77 | Temp 98.0°F | Resp 16 | Ht 73.0 in | Wt 177.0 lb

## 2016-08-21 DIAGNOSIS — G609 Hereditary and idiopathic neuropathy, unspecified: Secondary | ICD-10-CM

## 2016-08-21 DIAGNOSIS — G8929 Other chronic pain: Secondary | ICD-10-CM

## 2016-08-21 DIAGNOSIS — M542 Cervicalgia: Secondary | ICD-10-CM

## 2016-08-21 MED ORDER — CYCLOBENZAPRINE HCL 10 MG PO TABS: 10.0000 mg | ORAL_TABLET | Freq: Three times a day (TID) | ORAL | 1 refills | Status: DC | PRN

## 2016-08-21 NOTE — Progress Notes (Signed)
OFFICE VISIT  08/21/2016   CC:  Chief Complaint  Patient presents with  . Discuss Lab Results   HPI:    Patient is a 56 y.o. Caucasian male who presents for discussion of recent labs done 08/06/16 for further investigation for cause of his painful peripheral neuropathy in his feet. His vitamine E, Vit B1, vit B12, methylmalonic acid, and ACE levels were all normal. His vit B6 was elevated at 65.6 (normal 2-21).  I notified him per nurse phone call that cases of peripheral neuropathy have been reported with long-term megadoses of pyridoxine over 250 mg/day; a few cases of neuropathy appear to have been caused by chronic intake of 100 to 200 mg/day.  Pt reported he takes a MVI and he was going to check the bottle to see how much B6 is in it.  He then called back wanting to ask more questions/discuss results further and I requested that he make an office visit to do this.  Pt with hx of intolerance to several meds used to treat his neuropathy, including neurontin when used at high doses (sexual side effects), lyrica, nortriptyline, and carbamazepine.  He has seen 3 neurologists over the course of the last several years for this condition. Most recently saw Dr. Everette Rank with [facility].  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  He rx'd the patient pristiq.  Patient is upset that he was required to come in today to discuss this situation.  He wanted to do this by phone. He is frustrated b/c he is always in pain, not getting sleep, and he can't find any MD or med that has helped him.   Currently pt is taking 600 mg gabapentin qhs, 30 mg nortriptyline qAM.  Says he is still in pain with these meds and has severe constipation.  He stopped his MVI.    He also has recurrent tightness in neck muscles, which alters his neck positioning (esp when sleeping) and causes numbness in both hands.  He asks about the possibility of a muscle relaxer helping this b/c he has never tried one.    Of note,  we did discuss the possibility of using narcotic pain med for this condition as a last resort but he does not want to take this kind of med on a regular basis.  Says that he was on percocet after he had neck surgery and he did not feel like it helped any for his foot pain.  He asked about alpha lipoic acid.  I told him that it would be worth a try to take this med, as some studies show it has helped various peripheral neuropathies.  Past Medical History:  Diagnosis Date  . BPH (benign prostatic hypertrophy)   . Cervical spondylosis    with radiculopathy not responsive to conservative therapies:  Spine and scoliosis center 02/2015: ACDF surgery C5-C7 performed 04/05/15  . Dysplastic nevus 10/2015   left mid back: Dr. Denna Haggard following  . Glaucoma   . History of adenomatous polyp of colon   . Lateral epicondylitis of left elbow   . Peripheral neuropathy ([facility])    Idiopathic, inherited per pt's neurologist, Dr. Everette Rank. Various labs normal 07/2016 except vitamin B6 was siginificantly elevated.  Allergist eval/allergy testing (including food) ALL NORMAL/NEG 07/2016.  Marland Kitchen Restless legs syndrome 09/27/2014  . Throat irritation    chronic (since cervical spine surgery 2016).  Dr. Erik Obey is working him up as of 10/2015 (laryngoscopy normal; trial of protonix started)    Past Surgical History:  Procedure Laterality Date  . Ant cerv decompression/discectomy w/fusion C 5-7  04/05/15   C5-7 ACDF  . CATARACT EXTRACTION Right    2010  . COLONOSCOPY W/ POLYPECTOMY  08/2012; 05/23/16   2017, adenomatous polyp--recall 5 yrs.  Marland Kitchen EYE SURGERY Right   . TRABECULECTOMY  2009   for uncontrolled glaucoma R eye  . TRANSURETHRAL RESECTION OF PROSTATE  2008   due to enlarged prostate  . VASECTOMY  2001    Outpatient Medications Prior to Visit  Medication Sig Dispense Refill  . Coenzyme Q10 (CO Q 10) 100 MG CAPS Take 100 mg by mouth daily.    Marland Kitchen gabapentin (NEURONTIN) 300 MG capsule Take 1 capsule in the morning and 2  capsules at bedtime 90 capsule 6  . Probiotic Product (PROBIOTIC DAILY PO) Take 2 tablets by mouth daily.     Marland Kitchen SIMBRINZA 1-0.2 % SUSP Place 1 drop into the left eye 2 (two) times daily.    . ALPHAGAN P 0.1 % SOLN PLACE 1 DROP INTO THE LEFT EYE 2 TIMES DAILY.  12  . bimatoprost (LUMIGAN) 0.01 % SOLN Place 1 drop into the left eye at bedtime.     Marland Kitchen desvenlafaxine (PRISTIQ) 50 MG 24 hr tablet Take 50 mg by mouth.    . Multiple Vitamin (MULTIVITAMIN) tablet Take 1 tablet by mouth daily.    . prednisoLONE acetate (PRED FORTE) 1 % ophthalmic suspension Apply to eye.     Facility-Administered Medications Prior to Visit  Medication Dose Route Frequency Provider Last Rate Last Dose  . 0.9 %  sodium chloride infusion  500 mL Intravenous Continuous Kavitha Nandigam V, MD        No Known Allergies  ROS As per HPI  PE: Blood pressure 108/72, pulse 77, temperature 98 F (36.7 C), temperature source Oral, resp. rate 16, height 6\' 1"  (1.854 m), weight 177 lb (80.3 kg), SpO2 97 %. Gen: Alert, well appearing.  Patient is oriented to person, place, time, and situation. AFFECT: pleasant, lucid thought and speech. No further exam today.  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  Poorly controlled on low dose neurontin and nortriptyline + bad constipation on this regimen.  Adverse side effects to all other possible neuropathic pain meds.  Recent lab eval showed elevated vit B6 but it is very unlikely this is the cause of his neuropathy. We discussed his neck discomfort and decided to try cyclobenzaprine 10mg  q8h prn, #90, RF x 1. He said he plans on following up with his neurologist at [facility].  An After Visit Summary was printed and given to the patient.  Spent 30 min with pt today, with >50% of this time spent in counseling and care coordination regarding the above problems.  FOLLOW UP: Return for follow up as needed..  Signed:  Crissie Sickles,  MD           08/21/2016

## 2016-08-21 NOTE — Progress Notes (Signed)
Pre visit review using our clinic review tool, if applicable. No additional management support is needed unless otherwise documented below in the visit note. 

## 2016-11-01 ENCOUNTER — Encounter: Payer: Self-pay | Admitting: Family Medicine

## 2016-11-01 ENCOUNTER — Other Ambulatory Visit: Payer: Self-pay | Admitting: Family Medicine

## 2016-11-01 MED ORDER — HYDROCODONE-ACETAMINOPHEN 5-325 MG PO TABS: 1.0000 | ORAL_TABLET | Freq: Four times a day (QID) | ORAL | 0 refills | Status: DC | PRN

## 2016-11-01 NOTE — Telephone Encounter (Signed)
Please advise. Thanks.  

## 2016-12-12 ENCOUNTER — Encounter: Payer: Self-pay | Admitting: Family Medicine

## 2016-12-12 ENCOUNTER — Ambulatory Visit (INDEPENDENT_AMBULATORY_CARE_PROVIDER_SITE_OTHER): Payer: 59 | Admitting: Family Medicine

## 2016-12-12 VITALS — BP 93/60 | HR 65 | Temp 97.7°F | Resp 16 | Ht 72.0 in | Wt 170.2 lb

## 2016-12-12 DIAGNOSIS — Z0000 Encounter for general adult medical examination without abnormal findings: Secondary | ICD-10-CM

## 2016-12-12 DIAGNOSIS — Z23 Encounter for immunization: Secondary | ICD-10-CM | POA: Diagnosis not present

## 2016-12-12 DIAGNOSIS — Z125 Encounter for screening for malignant neoplasm of prostate: Secondary | ICD-10-CM | POA: Diagnosis not present

## 2016-12-12 LAB — CBC WITH DIFFERENTIAL/PLATELET
Basophils Absolute: 0 cells/uL (ref 0–200)
Basophils Relative: 0 %
EOS PCT: 2 %
Eosinophils Absolute: 120 cells/uL (ref 15–500)
HCT: 49.1 % (ref 38.5–50.0)
HEMOGLOBIN: 16.7 g/dL (ref 13.2–17.1)
LYMPHS ABS: 1920 {cells}/uL (ref 850–3900)
Lymphocytes Relative: 32 %
MCH: 30.6 pg (ref 27.0–33.0)
MCHC: 34 g/dL (ref 32.0–36.0)
MCV: 89.9 fL (ref 80.0–100.0)
MPV: 10 fL (ref 7.5–12.5)
Monocytes Absolute: 420 cells/uL (ref 200–950)
Monocytes Relative: 7 %
NEUTROS PCT: 59 %
Neutro Abs: 3540 cells/uL (ref 1500–7800)
Platelets: 181 10*3/uL (ref 140–400)
RBC: 5.46 MIL/uL (ref 4.20–5.80)
RDW: 13.6 % (ref 11.0–15.0)
WBC: 6 10*3/uL (ref 3.8–10.8)

## 2016-12-12 LAB — TSH: TSH: 1.78 m[IU]/L (ref 0.40–4.50)

## 2016-12-12 NOTE — Progress Notes (Addendum)
Office Note 12/12/2016  CC:  Chief Complaint  Patient presents with  . Annual Exam    Pt is fasting.    HPI:  Keith Mckee is a 56 y.o. White male who is here for annual health maintenance exam. No acute complaints.  Eyes: exams regularly. Dental: prev utd. Exercise: 5 d/week Diet: not working on anything in particular.  Past Medical History:  Diagnosis Date  . BPH (benign prostatic hypertrophy)   . Cervical spondylosis    with radiculopathy not responsive to conservative therapies:  [facility]: ACDF surgery C5-C7 performed 04/05/15  . Dysplastic nevus 10/2015   left mid back: Dr. Denna Haggard following  . Glaucoma   . History of adenomatous polyp of colon   . Lateral epicondylitis of left elbow   . Peripheral neuropathy    Idiopathic, inherited per pt's neurologist, Dr. Everette Rank. Various labs normal 07/2016 except vitamin B6 was siginificantly elevated.  Allergist eval/allergy testing (including food) ALL NORMAL/NEG 07/2016.  Marland Kitchen Restless legs syndrome 09/27/2014  . Throat irritation    chronic (since cervical spine surgery 2016).  Dr. Erik Obey is working him up as of 10/2015 (laryngoscopy normal; trial of protonix started)    Past Surgical History:  Procedure Laterality Date  . Ant cerv decompression/discectomy w/fusion C 5-7  04/05/15   C5-7 ACDF  . CATARACT EXTRACTION Right    2010  . COLONOSCOPY W/ POLYPECTOMY  08/2012; 05/23/16   2017, adenomatous polyp--recall 5 yrs.  Marland Kitchen EYE SURGERY Right   . TRABECULECTOMY  2009   for uncontrolled glaucoma R eye  . TRANSURETHRAL RESECTION OF PROSTATE  2008   due to enlarged prostate  . VASECTOMY  2001    Family History  Problem Relation Age of Onset  . Glaucoma Father   . Heart disease Father   . Neuropathy Father        Peripheral neuropathy  . Arthritis Father   . Parkinson's disease Father   . Colon cancer Neg Hx   . Rectal cancer Neg Hx     Social History   Social History  . Marital status:  Married    Spouse name: N/A  . Number of children: 2  . Years of education: Master D.   Occupational History  . [employer]  . handyman    Social History Main Topics  . Smoking status: Former Smoker    Packs/day: 0.75    Years: 15.00    Types: Cigarettes    Quit date: 06/24/1998  . Smokeless tobacco: Never Used     Comment: 2 pack per week  . Alcohol use 7.2 oz/week    12 Cans of beer per week     Comment: Patient drinks 12 beers a week  . Drug use: No  . Sexual activity: Not on file   Other Topics Concern  . Not on file   Social History Narrative   Married.   Educ: MBA   Occupation: Real Product manager   No tob, occ alc, no drugs.   Exercises regularly.   Patient is right handed.   Patient drinks 1 large cup of coffee daily.    Outpatient Medications Prior to Visit  Medication Sig Dispense Refill  . Coenzyme Q10 (CO Q 10) 100 MG CAPS Take 100 mg by mouth daily.    . cyclobenzaprine (FLEXERIL) 10 MG tablet Take 1 tablet (10 mg total) by mouth 3 (three) times daily as  needed for muscle spasms. 90 tablet 1  . gabapentin (NEURONTIN) 300 MG capsule Take 1 capsule in the morning and 2 capsules at bedtime 90 capsule 6  . HYDROcodone-acetaminophen (NORCO/VICODIN) 5-325 MG tablet Take 1-2 tablets by mouth every 6 (six) hours as needed for moderate pain. 30 tablet 0  . latanoprost (XALATAN) 0.005 % ophthalmic solution Place 1 drop into the left eye at bedtime.    . Probiotic Product (PROBIOTIC DAILY PO) Take 2 tablets by mouth daily.     Marland Kitchen SIMBRINZA 1-0.2 % SUSP Place 1 drop into the left eye 2 (two) times daily.     Facility-Administered Medications Prior to Visit  Medication Dose Route Frequency Provider Last Rate Last Dose  . 0.9 %  sodium chloride infusion  500 mL Intravenous Continuous Nandigam, Kavitha V, MD        No Known Allergies  ROS Review of Systems  Constitutional: Negative for appetite  change, chills, fatigue and fever.  HENT: Negative for congestion, dental problem, ear pain and sore throat.   Eyes: Negative for discharge, redness and visual disturbance.  Respiratory: Negative for cough, chest tightness, shortness of breath and wheezing.   Cardiovascular: Negative for chest pain, palpitations and leg swelling.  Gastrointestinal: Negative for abdominal pain, blood in stool, diarrhea, nausea and vomiting.  Genitourinary: Negative for difficulty urinating, dysuria, flank pain, frequency, hematuria and urgency.  Musculoskeletal: Negative for arthralgias, back pain, joint swelling, myalgias and neck stiffness.  Skin: Negative for pallor and rash.  Neurological: Negative for dizziness, speech difficulty, weakness and headaches.  Hematological: Negative for adenopathy. Does not bruise/bleed easily.  Psychiatric/Behavioral: Negative for confusion and sleep disturbance. The patient is not nervous/anxious.     PE; Blood pressure 93/60, pulse 65, temperature 97.7 F (36.5 C), temperature source Oral, resp. rate 16, height 6' (1.829 m), weight 170 lb 4 oz (77.2 kg), SpO2 99 %. Body mass index is 23.09 kg/m.  Gen: Alert, well appearing.  Patient is oriented to person, place, time, and situation. AFFECT: pleasant, lucid thought and speech. ENT: Ears: EACs clear, normal epithelium.  TMs with good light reflex and landmarks bilaterally.  Eyes: no injection, icteris, swelling, or exudate.  EOMI, PERRLA.  R red reflex dimmer than L. Nose: no drainage or turbinate edema/swelling.  No injection or focal lesion.  Mouth: lips without lesion/swelling.  Oral mucosa pink and moist.  Dentition intact and without obvious caries or gingival swelling.  Oropharynx without erythema, exudate, or swelling.  Neck: supple/nontender.  No LAD, mass, or TM.  Carotid pulses 2+ bilaterally, without bruits. CV: RRR, no m/r/g.   LUNGS: CTA bilat, nonlabored resps, good aeration in all lung fields. ABD: soft, NT,  ND, BS normal.  No hepatospenomegaly or mass.  No bruits. EXT: no clubbing, cyanosis, or edema.  Musculoskeletal: no joint swelling, erythema, warmth, or tenderness.  ROM of all joints intact. Skin - no sores or suspicious lesions or rashes or color changes Rectal exam: negative without mass, lesions or tenderness, PROSTATE EXAM: smooth and symmetric without nodules or tenderness.   Pertinent labs:  Lab Results  Component Value Date   TSH 2.40 12/12/2015   Lab Results  Component Value Date   WBC 4.6 12/12/2015   HGB 15.3 12/12/2015   HCT 46.7 12/12/2015   MCV 90.7 12/12/2015   PLT 185 12/12/2015   Lab Results  Component Value Date   CREATININE 1.13 12/12/2015   BUN 15 12/12/2015   NA 139 12/12/2015   K 4.2 12/12/2015  CL 106 12/12/2015   CO2 26 12/12/2015   Lab Results  Component Value Date   ALT 17 12/12/2015   AST 17 12/12/2015   ALKPHOS 65 12/12/2015   BILITOT 0.7 12/12/2015   Lab Results  Component Value Date   CHOL 151 12/12/2015   Lab Results  Component Value Date   HDL 67 12/12/2015   Lab Results  Component Value Date   LDLCALC 52 12/12/2015   Lab Results  Component Value Date   TRIG 159 (H) 12/12/2015   Lab Results  Component Value Date   CHOLHDL 2.3 12/12/2015   Lab Results  Component Value Date   PSA 1.71 12/12/2015   PSA 1.72 12/22/2014    ASSESSMENT AND PLAN:   Health maintenance exam: Reviewed age and gender appropriate health maintenance issues (prudent diet, regular exercise, health risks of tobacco and excessive alcohol, use of seatbelts, fire alarms in home, use of sunscreen).  Also reviewed age and gender appropriate health screening as well as vaccine recommendations. Vaccines: we'll do this next year b/c he feels like he had this about 9 yrs ago.  Discussed shingrix: gave #1 today. HP labs done today. Prostate ca screening: DRE normal , PSA drawn. Colon ca screening: next colonoscopy due 04/2021.  An After Visit Summary was  printed and given to the patient.  FOLLOW UP:  Return in about 1 year (around 12/12/2017) for annual CPE (fasting).  Signed:  Crissie Sickles, MD           12/12/2016

## 2016-12-12 NOTE — Patient Instructions (Signed)

## 2016-12-12 NOTE — Addendum Note (Signed)
Addended by: Onalee Hua on: 12/12/2016 08:53 AM   Modules accepted: Orders

## 2016-12-13 LAB — COMPREHENSIVE METABOLIC PANEL
ALBUMIN: 4.2 g/dL (ref 3.6–5.1)
ALK PHOS: 64 U/L (ref 40–115)
ALT: 17 U/L (ref 9–46)
AST: 16 U/L (ref 10–35)
BILIRUBIN TOTAL: 0.8 mg/dL (ref 0.2–1.2)
BUN: 16 mg/dL (ref 7–25)
CALCIUM: 9.5 mg/dL (ref 8.6–10.3)
CO2: 23 mmol/L (ref 20–31)
Chloride: 104 mmol/L (ref 98–110)
Creat: 1.16 mg/dL (ref 0.70–1.33)
Glucose, Bld: 83 mg/dL (ref 65–99)
Potassium: 4.7 mmol/L (ref 3.5–5.3)
Sodium: 140 mmol/L (ref 135–146)
TOTAL PROTEIN: 6.6 g/dL (ref 6.1–8.1)

## 2016-12-13 LAB — LIPID PANEL
CHOL/HDL RATIO: 2.7 ratio (ref <5.0)
CHOLESTEROL: 186 mg/dL (ref <200)
HDL: 69 mg/dL (ref >40)
LDL Cholesterol: 96 mg/dL (ref <100)
Triglycerides: 105 mg/dL (ref <150)
VLDL: 21 mg/dL (ref <30)

## 2016-12-13 LAB — PSA: PSA: 1.1 ng/mL (ref <=4.0)

## 2017-02-22 ENCOUNTER — Encounter: Payer: Self-pay | Admitting: Family Medicine

## 2017-02-25 MED ORDER — GABAPENTIN 300 MG PO CAPS: ORAL_CAPSULE | ORAL | 0 refills | Status: DC

## 2017-02-25 NOTE — Telephone Encounter (Signed)
Pt requesting refill be sent to local pharmacy [facility] [place], pt stated that he is taking 4 capsules daily.   RF request for gabapentin LOV: 12/12/16 Next ov: 12/15/17  Please advise. Thanks.

## 2017-03-31 ENCOUNTER — Ambulatory Visit: Payer: 59

## 2017-04-01 ENCOUNTER — Ambulatory Visit (INDEPENDENT_AMBULATORY_CARE_PROVIDER_SITE_OTHER): Payer: 59 | Admitting: Family Medicine

## 2017-04-01 DIAGNOSIS — Z23 Encounter for immunization: Secondary | ICD-10-CM | POA: Diagnosis not present

## 2017-04-02 ENCOUNTER — Ambulatory Visit: Payer: 59

## 2017-05-13 ENCOUNTER — Other Ambulatory Visit: Payer: Self-pay | Admitting: Family Medicine

## 2017-05-13 ENCOUNTER — Encounter: Payer: Self-pay | Admitting: Family Medicine

## 2017-05-13 DIAGNOSIS — G609 Hereditary and idiopathic neuropathy, unspecified: Secondary | ICD-10-CM

## 2017-05-13 MED ORDER — HYDROCODONE-ACETAMINOPHEN 5-325 MG PO TABS: 1.0000 | ORAL_TABLET | Freq: Four times a day (QID) | ORAL | 0 refills | Status: DC | PRN

## 2017-05-13 NOTE — Telephone Encounter (Signed)
RF request for hydro/apap LOV: 12/12/16 Next ov: 12/15/17 Last written: 11/01/16 #30 w/ [id number]  Please advise. Thanks.

## 2017-05-13 NOTE — Telephone Encounter (Signed)
Okay for pt to take magnesium? Please advise. Thanks.

## 2017-05-13 NOTE — Telephone Encounter (Signed)
Rx put up front for p/u. Pt advised and voiced understanding.   

## 2017-06-30 ENCOUNTER — Encounter: Payer: Self-pay | Admitting: Family Medicine

## 2017-06-30 ENCOUNTER — Ambulatory Visit (INDEPENDENT_AMBULATORY_CARE_PROVIDER_SITE_OTHER): Payer: 59 | Admitting: Family Medicine

## 2017-06-30 VITALS — BP 101/59 | HR 67 | Temp 97.9°F | Resp 16 | Ht 72.0 in | Wt 174.8 lb

## 2017-06-30 DIAGNOSIS — M7912 Myalgia of auxiliary muscles, head and neck: Secondary | ICD-10-CM | POA: Diagnosis not present

## 2017-06-30 NOTE — Progress Notes (Signed)
OFFICE VISIT  06/30/2017   CC:  Chief Complaint  Patient presents with  . Jaw Pain    right side, off and on x 2-3 months   HPI:    Patient is a 57 y.o. Caucasian male who presents for "sore jaw and swollen lymph nodes". Irritation/pain felt a couple of cm behind angle of R mandible.  Rubs it sometimes and this makes the aching a bit more pronounced sometimes, other times it seems to alleviate it.  Sometimes the pain goes to R ear area.  No redness in the area.  He does feel a fullness in the area at times, and other times he doesn't.   No clear connection to anything.  Teeth feel fine.  No fevers.  Past Medical History:  Diagnosis Date  . BPH (benign prostatic hypertrophy)   . Cervical spondylosis    with radiculopathy not responsive to conservative therapies:  [facility] 02/2015: ACDF surgery C5-C7 performed 04/05/15  . Dysplastic nevus 10/2015   left mid back: Dr. Denna Haggard following  . Glaucoma   . History of adenomatous polyp of colon   . Lateral epicondylitis of left elbow   . Peripheral neuropathy    Idiopathic, inherited per pt's neurologist, Dr. Everette Rank. Various labs normal 07/2016 except vitamin B6 was siginificantly elevated.  Allergist eval/allergy testing (including food) ALL NORMAL/NEG 07/2016.  Marland Kitchen Restless legs syndrome 09/27/2014  . Throat irritation    chronic (since cervical spine surgery 2016).  Dr. Erik Obey is working him up as of 10/2015 (laryngoscopy normal; trial of protonix started)    Past Surgical History:  Procedure Laterality Date  . Ant cerv decompression/discectomy w/fusion C 5-7  04/05/15   C5-7 ACDF  . CATARACT EXTRACTION Right    2010  . COLONOSCOPY W/ POLYPECTOMY  08/2012; 05/23/16   2017, adenomatous polyp--recall 5 yrs.  Marland Kitchen EYE SURGERY Right   . TRABECULECTOMY  2009   for uncontrolled glaucoma R eye  . TRANSURETHRAL RESECTION OF PROSTATE  2008   due to enlarged prostate  . VASECTOMY  2001    Outpatient Medications Prior to Visit   Medication Sig Dispense Refill  . bimatoprost (LUMIGAN) 0.03 % ophthalmic solution Place 1 drop into the left eye at bedtime.    . Coenzyme Q10 (CO Q 10) 100 MG CAPS Take 100 mg by mouth daily.    . cyclobenzaprine (FLEXERIL) 10 MG tablet Take 1 tablet (10 mg total) by mouth 3 (three) times daily as needed for muscle spasms. 90 tablet 1  . DOCOSAHEXAENOIC ACID PO Take 1 g by mouth daily.     Marland Kitchen gabapentin (NEURONTIN) 300 MG capsule Take 1 capsules bid (Patient taking differently: Take 600 mg by mouth 2 (two) times daily. ) 120 capsule 0  . HYDROcodone-acetaminophen (NORCO/VICODIN) 5-325 MG tablet Take 1-2 tablets by mouth every 6 (six) hours as needed for moderate pain. 30 tablet 0  . Probiotic Product (PROBIOTIC DAILY PO) Take 2 tablets by mouth daily.     Marland Kitchen SIMBRINZA 1-0.2 % SUSP Place 1 drop into the left eye 2 (two) times daily.    Marland Kitchen 0.9 %  sodium chloride infusion      No facility-administered medications prior to visit.     No Known Allergies  ROS As per HPI  PE: Blood pressure (!) 101/59, pulse 67, temperature 97.9 F (36.6 C), temperature source Oral, resp. rate 16, height 6' (1.829 m), weight 174 lb 12 oz (79.3 kg), SpO2 97 %. Gen: Alert, well appearing.  Patient is oriented to person, place, time, and situation. AFFECT: pleasant, lucid thought and speech. ENT: Ears: EACs clear, normal epithelium.  TMs with good light reflex and landmarks bilaterally.  Eyes: no injection, icteris, swelling, or exudate.  EOMI, PERRLA. Nose: no drainage or turbinate edema/swelling.  No injection or focal lesion.  Mouth: lips without lesion/swelling.  Oral mucosa pink and moist.  Dentition intact and without obvious caries or gingival swelling.  Oropharynx without erythema, exudate, or swelling.  TMJ's w/out tenderness or subluxation.  No asymmetry. No mass/fullness/tenderness is palpable behind R ear or mandible except for a small region of the SCM muscle where patient has concern.  Slight  rippled/ropy texture to the muscle in this region. No induration, no lymph node, no mass.  ROM of neck against resistance does not elicit any pain. No ant or post cerv LAD.  LABS:    Chemistry      Component Value Date/Time   NA 140 12/12/2016 0846   K 4.7 12/12/2016 0846   CL 104 12/12/2016 0846   CO2 23 12/12/2016 0846   BUN 16 12/12/2016 0846   CREATININE 1.16 12/12/2016 0846      Component Value Date/Time   CALCIUM 9.5 12/12/2016 0846   ALKPHOS 64 12/12/2016 0846   AST 16 12/12/2016 0846   ALT 17 12/12/2016 0846   BILITOT 0.8 12/12/2016 0846     Lab Results  Component Value Date   WBC 6.0 12/12/2016   HGB 16.7 12/12/2016   HCT 49.1 12/12/2016   MCV 89.9 12/12/2016   PLT 181 12/12/2016     IMPRESSION AND PLAN:  Right SCM strain/irritation/tension near its insertion into mastoid region. No sign of lymphadenopathy, solitary node, or soft tissue or bony mass. Reassured pt. Plan: Massage the area of concern gently, apply heating pad x 20 min as needed, and do gentle range of motion exercises with neck periodically. Signs/symptoms to call or return for were reviewed and pt expressed understanding.  An After Visit Summary was printed and given to the patient.  FOLLOW UP: Return if symptoms worsen or fail to improve.  Signed:  Crissie Sickles, MD           06/30/2017

## 2017-06-30 NOTE — Patient Instructions (Signed)
Massage the area of concern gently, apply heating pad x 20 min as needed, and do gentle range of motion exercises with neck periodically.

## 2017-12-15 ENCOUNTER — Ambulatory Visit (INDEPENDENT_AMBULATORY_CARE_PROVIDER_SITE_OTHER): Payer: 59 | Admitting: Family Medicine

## 2017-12-15 ENCOUNTER — Encounter: Payer: Self-pay | Admitting: Family Medicine

## 2017-12-15 VITALS — BP 95/60 | HR 55 | Temp 97.8°F | Resp 16 | Ht 73.0 in | Wt 166.0 lb

## 2017-12-15 DIAGNOSIS — M792 Neuralgia and neuritis, unspecified: Secondary | ICD-10-CM | POA: Diagnosis not present

## 2017-12-15 DIAGNOSIS — Z79899 Other long term (current) drug therapy: Secondary | ICD-10-CM

## 2017-12-15 DIAGNOSIS — Z0000 Encounter for general adult medical examination without abnormal findings: Secondary | ICD-10-CM | POA: Diagnosis not present

## 2017-12-15 DIAGNOSIS — Z125 Encounter for screening for malignant neoplasm of prostate: Secondary | ICD-10-CM | POA: Diagnosis not present

## 2017-12-15 DIAGNOSIS — G894 Chronic pain syndrome: Secondary | ICD-10-CM

## 2017-12-15 MED ORDER — HYDROCODONE-ACETAMINOPHEN 5-325 MG PO TABS: 1.0000 | ORAL_TABLET | Freq: Four times a day (QID) | ORAL | 0 refills | Status: DC | PRN

## 2017-12-15 NOTE — Progress Notes (Signed)
Office Note 12/15/2017  CC:  Chief Complaint  Patient presents with  . Annual Exam    Pt is fasting.     HPI:  Miko Sirico is a 57 y.o. White male who is here for annual health maintenance exam.  Dental UTD. Eyes: ophth q72mo. Exercise: goes to gym 5 d/week. Diet: healthy.  Has idiopathic/familial peripheral neuropathy which he takes gabapentin for.   Also, takes very small amounts of vicodin for this, asks for RF today. Most recent RF was 05/20/17. Past Medical History:  Diagnosis Date  . BPH (benign prostatic hypertrophy)   . Cervical spondylosis    with radiculopathy not responsive to conservative therapies:  [facility] 02/2015: ACDF surgery C5-C7 performed 04/05/15  . Dysplastic nevus 10/2015   left mid back: Dr. Denna Haggard following  . Glaucoma   . History of adenomatous polyp of colon   . Lateral epicondylitis of left elbow   . Neuropathic pain   . Peripheral neuropathy    Idiopathic, inherited per pt's neurologist, Dr. Everette Rank. Various labs normal 07/2016 except vitamin B6 was siginificantly elevated.  Allergist eval/allergy testing (including food) ALL NORMAL/NEG 07/2016.  Marland Kitchen Restless legs syndrome 09/27/2014  . Throat irritation    chronic (since cervical spine surgery 2016).  Dr. Erik Obey is working him up as of 10/2015 (laryngoscopy normal; trial of protonix started)    Past Surgical History:  Procedure Laterality Date  . Ant cerv decompression/discectomy w/fusion C 5-7  04/05/15   C5-7 ACDF  . CATARACT EXTRACTION Right    2010  . COLONOSCOPY W/ POLYPECTOMY  08/2012; 05/23/16   2017, adenomatous polyp--recall 5 yrs.  Marland Kitchen EYE SURGERY Right   . TRABECULECTOMY  2009   for uncontrolled glaucoma R eye  . TRANSURETHRAL RESECTION OF PROSTATE  2008   due to enlarged prostate  . VASECTOMY  2001    Family History  Problem Relation Age of Onset  . Glaucoma Father   . Heart disease Father   . Neuropathy Father        Peripheral neuropathy  .  Arthritis Father   . Parkinson's disease Father   . Colon cancer Neg Hx   . Rectal cancer Neg Hx     Social History   Socioeconomic History  . Marital status: Married    Spouse name: Not on file  . Number of children: 2  . Years of education: Master D.  . Highest education level: Not on file  Occupational History  . Occupation: Immunologist: Building surveyor FOR SELF EMPLOYED    Comment: [employer]  . Occupation: Animator  Social Needs  . Financial resource strain: Not on file  . Food insecurity:    Worry: Not on file    Inability: Not on file  . Transportation needs:    Medical: Not on file    Non-medical: Not on file  Tobacco Use  . Smoking status: Former Smoker    Packs/day: 0.75    Years: 15.00    Pack years: 11.25    Types: Cigarettes    Last attempt to quit: 06/24/1998    Years since quitting: 19.4  . Smokeless tobacco: Never Used  . Tobacco comment: 2 pack per week  Substance and Sexual Activity  . Alcohol use: Yes    Alcohol/week: 7.2 oz    Types: 12 Cans of beer per week    Comment: Patient drinks 12 beers a week  . Drug use: No  .  Sexual activity: Not on file  Lifestyle  . Physical activity:    Days per week: Not on file    Minutes per session: Not on file  . Stress: Not on file  Relationships  . Social connections:    Talks on phone: Not on file    Gets together: Not on file    Attends religious service: Not on file    Active member of club or organization: Not on file    Attends meetings of clubs or organizations: Not on file    Relationship status: Not on file  . Intimate partner violence:    Fear of current or ex partner: Not on file    Emotionally abused: Not on file    Physically abused: Not on file    Forced sexual activity: Not on file  Other Topics Concern  . Not on file  Social History Narrative   Married.   Educ: MBA   Occupation: Real Product manager   No tob, occ alc, no drugs.   Exercises  regularly.   Patient is right handed.   Patient drinks 1 large cup of coffee daily.    Outpatient Medications Prior to Visit  Medication Sig Dispense Refill  . Alpha-Lipoic Acid 300 MG CAPS Take 1 capsule by mouth 2 (two) times daily.    . Coenzyme Q10 (CO Q 10) 100 MG CAPS Take 100 mg by mouth daily.    Marland Kitchen gabapentin (NEURONTIN) 300 MG capsule Take 1 capsules bid (Patient taking differently: Take 600 mg by mouth 2 (two) times daily. ) 120 capsule 0  . Omega-3 Fatty Acids (OMEGA-3 FISH OIL) 300 MG CAPS Take 1 capsule by mouth daily.    . Probiotic Product (PROBIOTIC DAILY PO) Take 2 tablets by mouth daily.     . Psyllium (METAMUCIL FIBER) 51.7 % PACK Take 1 packet by mouth daily.    Marland Kitchen SIMBRINZA 1-0.2 % SUSP Place 1 drop into the left eye 2 (two) times daily.    . bimatoprost (LUMIGAN) 0.03 % ophthalmic solution Place 1 drop into the left eye at bedtime.    Marland Kitchen HYDROcodone-acetaminophen (NORCO/VICODIN) 5-325 MG tablet Take 1-2 tablets by mouth every 6 (six) hours as needed for moderate pain. 30 tablet 0  . cyclobenzaprine (FLEXERIL) 10 MG tablet Take 1 tablet (10 mg total) by mouth 3 (three) times daily as needed for muscle spasms. (Patient not taking: Reported on 12/15/2017) 90 tablet 1  . DOCOSAHEXAENOIC ACID PO Take 1 g by mouth daily.      No facility-administered medications prior to visit.     No Known Allergies  ROS Review of Systems  Constitutional: Negative for appetite change, chills, fatigue and fever.  HENT: Negative for congestion, dental problem, ear pain and sore throat.   Eyes: Negative for discharge, redness and visual disturbance.  Respiratory: Negative for cough, chest tightness, shortness of breath and wheezing.   Cardiovascular: Negative for chest pain, palpitations and leg swelling.  Gastrointestinal: Negative for abdominal pain, blood in stool, diarrhea, nausea and vomiting.  Genitourinary: Negative for difficulty urinating, dysuria, flank pain, frequency, hematuria  and urgency.  Musculoskeletal: Negative for arthralgias, back pain, joint swelling, myalgias and neck stiffness.  Skin: Negative for pallor and rash.  Neurological: Negative for dizziness, speech difficulty, weakness and headaches.  Hematological: Negative for adenopathy. Does not bruise/bleed easily.  Psychiatric/Behavioral: Negative for confusion and sleep disturbance. The patient is not nervous/anxious.     PE; Blood pressure 95/60, pulse (!) 55, temperature 97.8 F (  36.6 C), temperature source Oral, resp. rate 16, height 6\' 1"  (1.854 m), weight 166 lb (75.3 kg), SpO2 97 %. Body mass index is 21.9 kg/m.  Gen: Alert, well appearing.  Patient is oriented to person, place, time, and situation. AFFECT: pleasant, lucid thought and speech. ENT: Ears: EACs clear, normal epithelium.  TMs with good light reflex and landmarks bilaterally.  Eyes: no injection, icteris, swelling, or exudate.  EOMI, PERRLA. Nose: no drainage or turbinate edema/swelling.  No injection or focal lesion.  Mouth: lips without lesion/swelling.  Oral mucosa pink and moist.  Dentition intact and without obvious caries or gingival swelling.  Oropharynx without erythema, exudate, or swelling.  Neck: supple/nontender.  No LAD, mass, or TM.  Carotid pulses 2+ bilaterally, without bruits. CV: RRR, no m/r/g.   LUNGS: CTA bilat, nonlabored resps, good aeration in all lung fields. ABD: soft, NT, ND, BS normal.  No hepatospenomegaly or mass.  No bruits. EXT: no clubbing, cyanosis, or edema.  Musculoskeletal: no joint swelling, erythema, warmth, or tenderness.  ROM of all joints intact. Skin - no sores or suspicious lesions or rashes or color changes Rectal exam: negative without mass, lesions or tenderness, PROSTATE EXAM: smooth and symmetric without nodules or tenderness.   Pertinent labs:  Lab Results  Component Value Date   TSH 1.78 12/12/2016   Lab Results  Component Value Date   WBC 6.0 12/12/2016   HGB 16.7 12/12/2016    HCT 49.1 12/12/2016   MCV 89.9 12/12/2016   PLT 181 12/12/2016   Lab Results  Component Value Date   CREATININE 1.16 12/12/2016   BUN 16 12/12/2016   NA 140 12/12/2016   K 4.7 12/12/2016   CL 104 12/12/2016   CO2 23 12/12/2016   Lab Results  Component Value Date   ALT 17 12/12/2016   AST 16 12/12/2016   ALKPHOS 64 12/12/2016   BILITOT 0.8 12/12/2016   Lab Results  Component Value Date   CHOL 186 12/12/2016   Lab Results  Component Value Date   HDL 69 12/12/2016   Lab Results  Component Value Date   LDLCALC 96 12/12/2016   Lab Results  Component Value Date   TRIG 105 12/12/2016   Lab Results  Component Value Date   CHOLHDL 2.7 12/12/2016   Lab Results  Component Value Date   PSA 1.1 12/12/2016   PSA 1.71 12/12/2015   PSA 1.72 12/22/2014    ASSESSMENT AND PLAN:   Health maintenance exam: Reviewed age and gender appropriate health maintenance issues (prudent diet, regular exercise, health risks of tobacco and excessive alcohol, use of seatbelts, fire alarms in home, use of sunscreen).  Also reviewed age and gender appropriate health screening as well as vaccine recommendations. Vaccines: All UTD, including Shingrix. Labs: Fasting HP and PSA. Prostate ca screening:  DRE normal today , PSA. Colon ca screening: next colonoscopy due 2022.  Chronic pain syndrome: neuropathic pain bilat LL's:  Controlled substance contract reviewed with patient today.  Patient signed this and it will be placed in the chart.   Vicodin 5/325, 1-2 bid prn, #30 rx'd today.  An After Visit Summary was printed and given to the patient.  FOLLOW UP:  Return in about 3 months (around 03/17/2018) for routine chronic illness f/u-pain.  Signed:  Crissie Sickles, MD           12/15/2017

## 2017-12-15 NOTE — Patient Instructions (Signed)

## 2017-12-16 LAB — CBC WITH DIFFERENTIAL/PLATELET
Basophils Absolute: 20 cells/uL (ref 0–200)
Basophils Relative: 0.4 %
EOS PCT: 2.9 %
Eosinophils Absolute: 142 cells/uL (ref 15–500)
HCT: 44.5 % (ref 38.5–50.0)
Hemoglobin: 15.3 g/dL (ref 13.2–17.1)
Lymphs Abs: 1279 cells/uL (ref 850–3900)
MCH: 30.4 pg (ref 27.0–33.0)
MCHC: 34.4 g/dL (ref 32.0–36.0)
MCV: 88.3 fL (ref 80.0–100.0)
MONOS PCT: 7.9 %
MPV: 11 fL (ref 7.5–12.5)
NEUTROS PCT: 62.7 %
Neutro Abs: 3072 cells/uL (ref 1500–7800)
PLATELETS: 161 10*3/uL (ref 140–400)
RBC: 5.04 10*6/uL (ref 4.20–5.80)
RDW: 13.3 % (ref 11.0–15.0)
TOTAL LYMPHOCYTE: 26.1 %
WBC mixed population: 387 cells/uL (ref 200–950)
WBC: 4.9 10*3/uL (ref 3.8–10.8)

## 2017-12-16 LAB — COMPREHENSIVE METABOLIC PANEL
AG Ratio: 2.3 (calc) (ref 1.0–2.5)
ALBUMIN MSPROF: 4.3 g/dL (ref 3.6–5.1)
ALKALINE PHOSPHATASE (APISO): 58 U/L (ref 40–115)
ALT: 14 U/L (ref 9–46)
AST: 15 U/L (ref 10–35)
BUN: 14 mg/dL (ref 7–25)
CHLORIDE: 106 mmol/L (ref 98–110)
CO2: 28 mmol/L (ref 20–32)
CREATININE: 1.12 mg/dL (ref 0.70–1.33)
Calcium: 9 mg/dL (ref 8.6–10.3)
GLOBULIN: 1.9 g/dL (ref 1.9–3.7)
Glucose, Bld: 87 mg/dL (ref 65–99)
POTASSIUM: 4.3 mmol/L (ref 3.5–5.3)
Sodium: 141 mmol/L (ref 135–146)
Total Bilirubin: 0.6 mg/dL (ref 0.2–1.2)
Total Protein: 6.2 g/dL (ref 6.1–8.1)

## 2017-12-16 LAB — LIPID PANEL
CHOLESTEROL: 151 mg/dL (ref <200)
HDL: 69 mg/dL (ref >40)
LDL Cholesterol (Calc): 62 mg/dL (calc)
NON-HDL CHOLESTEROL (CALC): 82 mg/dL (ref <130)
Total CHOL/HDL Ratio: 2.2 (calc) (ref <5.0)
Triglycerides: 112 mg/dL (ref <150)

## 2017-12-16 LAB — PSA: PSA: 1.3 ng/mL (ref <=4.0)

## 2017-12-16 LAB — TSH: TSH: 1.64 mIU/L (ref 0.40–4.50)

## 2017-12-17 ENCOUNTER — Encounter: Payer: Self-pay | Admitting: *Deleted

## 2017-12-24 DIAGNOSIS — H409 Unspecified glaucoma: Secondary | ICD-10-CM | POA: Insufficient documentation

## 2018-02-06 ENCOUNTER — Ambulatory Visit: Payer: Self-pay | Admitting: *Deleted

## 2018-02-06 ENCOUNTER — Encounter: Payer: Self-pay | Admitting: Family Medicine

## 2018-02-06 ENCOUNTER — Ambulatory Visit (INDEPENDENT_AMBULATORY_CARE_PROVIDER_SITE_OTHER): Payer: 59 | Admitting: Family Medicine

## 2018-02-06 VITALS — BP 106/64 | HR 53 | Temp 98.0°F | Resp 16 | Ht 73.0 in | Wt 162.1 lb

## 2018-02-06 DIAGNOSIS — H8110 Benign paroxysmal vertigo, unspecified ear: Secondary | ICD-10-CM

## 2018-02-06 NOTE — Progress Notes (Signed)
OFFICE VISIT  02/06/2018   CC:  Chief Complaint  Patient presents with  . Dizziness   HPI:    Patient is a 57 y.o. Caucasian male who presents for dizziness. While in bed this morning, rolling over one side to the other, immediately felt like the bed was going to flip over, felt like he had to put out his arms to support himself.  Consistently having this episodically throughout the day today triggered by head looking down or up (or leaning over to pick something up) or turning, lasts 5 seconds or less.  No nausea during the episodes.  No symptoms in between episodes.  No ringing in ears or hearing deficit. BP this morning was 90 /65, 88/64.  No palpitations or heart racing. Never felt like he was going to faint.  Bite on neck yesterday, unknown what bit him.  Past Medical History:  Diagnosis Date  . BPH (benign prostatic hypertrophy)   . Cervical spondylosis    with radiculopathy not responsive to conservative therapies:  [facility]: ACDF surgery C5-C7 performed 04/05/15  . Dysplastic nevus 10/2015   left mid back: Dr. Denna Haggard following  . Glaucoma   . History of adenomatous polyp of colon   . Lateral epicondylitis of left elbow   . Neuropathic pain   . Peripheral neuropathy    Idiopathic, inherited per pt's neurologist, Dr. Everette Rank. Various labs normal 07/2016 except vitamin B6 was siginificantly elevated.  Allergist eval/allergy testing (including food) ALL NORMAL/NEG 07/2016.  Marland Kitchen Restless legs syndrome 09/27/2014  . Throat irritation    chronic (since cervical spine surgery 2016).  Dr. Erik Obey is working him up as of 10/2015 (laryngoscopy normal; trial of protonix started)    Past Surgical History:  Procedure Laterality Date  . Ant cerv decompression/discectomy w/fusion C 5-7  04/05/15   C5-7 ACDF  . CATARACT EXTRACTION Right    2010  . COLONOSCOPY W/ POLYPECTOMY  08/2012; 05/23/16   2017, adenomatous polyp--recall 5 yrs.  Marland Kitchen EYE SURGERY Right   .  TRABECULECTOMY  2009   for uncontrolled glaucoma R eye  . TRANSURETHRAL RESECTION OF PROSTATE  2008   due to enlarged prostate  . VASECTOMY  2001    Outpatient Medications Prior to Visit  Medication Sig Dispense Refill  . Alpha-Lipoic Acid 300 MG CAPS Take 1 capsule by mouth 2 (two) times daily.    . Coenzyme Q10 (CO Q 10) 100 MG CAPS Take 100 mg by mouth daily.    Marland Kitchen gabapentin (NEURONTIN) 300 MG capsule Take 1 capsules bid (Patient taking differently: Take 600 mg by mouth 2 (two) times daily. ) 120 capsule 0  . HYDROcodone-acetaminophen (NORCO/VICODIN) 5-325 MG tablet Take 1-2 tablets by mouth every 6 (six) hours as needed for moderate pain. 30 tablet 0  . LUMIGAN 0.01 % SOLN Place 1 drop into the left eye at bedtime.  12  . Omega-3 Fatty Acids (OMEGA-3 FISH OIL) 300 MG CAPS Take 1 capsule by mouth daily.    . Probiotic Product (PROBIOTIC DAILY PO) Take 2 tablets by mouth daily.     . Psyllium (METAMUCIL FIBER) 51.7 % PACK Take 1 packet by mouth daily.    Marland Kitchen SIMBRINZA 1-0.2 % SUSP Place 1 drop into the left eye 2 (two) times daily.     No facility-administered medications prior to visit.     No Known Allergies  ROS As per HPI  PE: Blood pressure 106/64, pulse (!) 53, temperature 98 F (36.7 C),  temperature source Oral, resp. rate 16, height 6\' 1"  (1.854 m), weight 162 lb 2 oz (73.5 kg), SpO2 98 %. Orthostatics:  Supine bp 118/68, HR 53, upright bp 96/59, HR 53, standing bp 97/59, HR 62.  Gen: Alert, well appearing.  Patient is oriented to person, place, time, and situation. AFFECT: pleasant, lucid thought and speech. ENT: PERRL, EOMI, no nystagmus Neck: supple/nontender.  No LAD, mass, or TM.  Carotid pulses 2+ bilaterally, without bruits. CV: RRR, no m/r/g.   LUNGS: CTA bilat, nonlabored resps, good aeration in all lung fields. EXT: no clubbing, cyanosis, or edema.  Dix Halpike: neg for vertigo or nystagmus with head turned to either side. Neuro: CN 2-12 intact bilaterally,  strength 5/5 in proximal and distal upper extremities and lower extremities bilaterally.  No sensory deficits.  No tremor.   No ataxia.  DTRs symmetric.  No pronator drift.  LABS:    Chemistry      Component Value Date/Time   NA 141 12/15/2017 0941   K 4.3 12/15/2017 0941   CL 106 12/15/2017 0941   CO2 28 12/15/2017 0941   BUN 14 12/15/2017 0941   CREATININE 1.12 12/15/2017 0941      Component Value Date/Time   CALCIUM 9.0 12/15/2017 0941   ALKPHOS 64 12/12/2016 0846   AST 15 12/15/2017 0941   ALT 14 12/15/2017 0941   BILITOT 0.6 12/15/2017 0941     Lab Results  Component Value Date   WBC 4.9 12/15/2017   HGB 15.3 12/15/2017   HCT 44.5 12/15/2017   MCV 88.3 12/15/2017   PLT 161 12/15/2017   Lab Results  Component Value Date   TSH 1.64 12/15/2017    IMPRESSION AND PLAN:  Dizziness: most c/w vertigo.  Suspect BPPV. No red flags for central vertigo, CVA, or cardiac etiology. He does run a low to low normal bp at his baseline, so I don't think low bp is causing his sx's. Discussed home epley's maneuvers, handout given today.    An After Visit Summary was printed and given to the patient.  FOLLOW UP: Return if symptoms worsen or fail to improve.  Signed:  Crissie Sickles, MD           02/06/2018

## 2018-02-06 NOTE — Telephone Encounter (Signed)
Contacted pt regarding symptoms; pt complains of dizziness before he got out of bed; he says that it felt like the bed was going to flip over; when he stands up or bends over he feels dizziness; recommendations made per nurse triage protocol to include seeing physician within 3 days; pt offered and accepted appointment with Dr Anitra Lauth, [place], 02/06/18 at 1515; he verbalizes understanding; will route to office for notification of this upcoming appointment; Dianne to make her aware.   Reason for Disposition . [1] MILD dizziness (e.g., vertigo; walking normally) AND [2] has NOT been evaluated by physician for this  Answer Assessment - Initial Assessment Questions 1. DESCRIPTION: "Describe your dizziness."     Room spinning 2. VERTIGO: "Do you feel like either you or the room is spinning or tilting?"      Room spinning 3. LIGHTHEADED: "Do you feel lightheaded?" (e.g., somewhat faint, woozy, weak upon standing)     Dizzy when stands up or bends over 4. SEVERITY: "How bad is it?"  "Can you walk?"   - MILD - Feels unsteady but walking normally.   - MODERATE - Feels very unsteady when walking, but not falling; interferes with normal activities (e.g., school, work) .   - SEVERE - Unable to walk without falling (requires assistance).     Mild 5. ONSET:  "When did the dizziness begin?"     02/06/18 6. AGGRAVATING FACTORS: "Does anything make it worse?" (e.g., standing, change in head position)     Standing up; bending over 7. CAUSE: "What do you think is causing the dizziness?"     unsure 8. RECURRENT SYMPTOM: "Have you had dizziness before?" If so, ask: "When was the last time?" "What happened that time?"     no 9. OTHER SYMPTOMS: "Do you have any other symptoms?" (e.g., headache, weakness, numbness, vomiting, earache)    BP 90/63 per automatic cuff at home (normally about 100/70) 10. PREGNANCY: "Is there any chance you are pregnant?" "When was your last menstrual period?"        n/a  Protocols used: DIZZINESS - VERTIGO-A-AH

## 2018-02-06 NOTE — Telephone Encounter (Signed)
Pt has apt today (02/06/18) at 3:15pm w/ Dr. Anitra Lauth.

## 2018-02-17 DIAGNOSIS — Z961 Presence of intraocular lens: Secondary | ICD-10-CM | POA: Insufficient documentation

## 2018-02-17 DIAGNOSIS — H2512 Age-related nuclear cataract, left eye: Secondary | ICD-10-CM | POA: Insufficient documentation

## 2018-03-17 ENCOUNTER — Ambulatory Visit: Payer: 59 | Admitting: Family Medicine

## 2018-03-23 ENCOUNTER — Ambulatory Visit (INDEPENDENT_AMBULATORY_CARE_PROVIDER_SITE_OTHER): Payer: 59 | Admitting: Family Medicine

## 2018-03-23 ENCOUNTER — Encounter: Payer: Self-pay | Admitting: Family Medicine

## 2018-03-23 VITALS — BP 120/72 | HR 66 | Temp 98.2°F | Resp 16 | Ht 73.0 in | Wt 165.0 lb

## 2018-03-23 DIAGNOSIS — M792 Neuralgia and neuritis, unspecified: Secondary | ICD-10-CM

## 2018-03-23 DIAGNOSIS — G8929 Other chronic pain: Secondary | ICD-10-CM

## 2018-03-23 DIAGNOSIS — M542 Cervicalgia: Secondary | ICD-10-CM | POA: Diagnosis not present

## 2018-03-23 DIAGNOSIS — G5793 Unspecified mononeuropathy of bilateral lower limbs: Secondary | ICD-10-CM

## 2018-03-23 DIAGNOSIS — Z23 Encounter for immunization: Secondary | ICD-10-CM

## 2018-03-23 DIAGNOSIS — G894 Chronic pain syndrome: Secondary | ICD-10-CM

## 2018-03-23 MED ORDER — HYDROCODONE-ACETAMINOPHEN 5-325 MG PO TABS: 1.0000 | ORAL_TABLET | Freq: Four times a day (QID) | ORAL | 0 refills | Status: DC | PRN

## 2018-03-23 NOTE — Progress Notes (Signed)
OFFICE VISIT  03/23/2018   CC:  Chief Complaint  Patient presents with  . Follow-up    RCI, pt is not fasting.    HPI:    Patient is a 57 y.o. Caucasian male who presents for 3 mo f/u chronic pain syndrome.  Indication for chronic opioid: Has idiopathic/familial peripheral neuropathy resulting in chronic bilat LL pain for which he takes gabapentin scheduled and small amounts of vicodin on prn basis.   Also has some neck pain/stiffness that he uses this for (also describes intermittent bothersome pain/tingling in both hands that are a result of his neck DDD and the surgery that was required for this problem). Medication and dose: Vicodin 5/325--USES RARELY.  Vicodin rx last filled 12/15/17. # pills per month: 30 (lasts > 1 mo) Last UDS date: none Opioid Treatment Agreement signed (Y/N): Y, 12/15/17 Opioid Treatment Agreement last reviewed with patient:  today [place] reviewed this encounter (include red flags):   today, no red flags.  He uses vicodin very infrequently, helps with his neck and bilat. He doesn't notice if it helps his LE neuropathic pain much b/c he says that this pain feels so much better controlled in the last 6 mo since adding alpha lipoic acid daily to his gabapentin regimen.  He uses ibuprofen as first choice: 600 mg once every few days. No GI upset, no melena.  Past Medical History:  Diagnosis Date  . BPH (benign prostatic hypertrophy)   . Cervical spondylosis    with radiculopathy not responsive to conservative therapies:  [facility] 02/2015: ACDF surgery C5-C7 performed 04/05/15  . Dysplastic nevus 10/2015   left mid back: Dr. Denna Haggard following  . Glaucoma   . History of adenomatous polyp of colon   . Lateral epicondylitis of left elbow   . Neuropathic pain   . Peripheral neuropathy    Idiopathic, inherited per pt's neurologist, Dr. Everette Rank. Various labs normal 07/2016 except vitamin B6 was siginificantly elevated.  Allergist eval/allergy testing  (including food) ALL NORMAL/NEG 07/2016.  Marland Kitchen Restless legs syndrome 09/27/2014  . Throat irritation    chronic (since cervical spine surgery 2016).  Dr. Erik Obey is working him up as of 10/2015 (laryngoscopy normal; trial of protonix started)    Past Surgical History:  Procedure Laterality Date  . Ant cerv decompression/discectomy w/fusion C 5-7  04/05/15   C5-7 ACDF  . CATARACT EXTRACTION Right    2010  . COLONOSCOPY W/ POLYPECTOMY  08/2012; 05/23/16   2017, adenomatous polyp--recall 5 yrs.  Marland Kitchen EYE SURGERY Right   . TRABECULECTOMY  2009   for uncontrolled glaucoma R eye  . TRANSURETHRAL RESECTION OF PROSTATE  2008   due to enlarged prostate  . VASECTOMY  2001    Outpatient Medications Prior to Visit  Medication Sig Dispense Refill  . Alpha-Lipoic Acid 300 MG CAPS Take 1 capsule by mouth 2 (two) times daily.    . Coenzyme Q10 (CO Q 10) 100 MG CAPS Take 100 mg by mouth daily.    Marland Kitchen gabapentin (NEURONTIN) 300 MG capsule Take 1 capsules bid (Patient taking differently: Take 600 mg by mouth 2 (two) times daily. ) 120 capsule 0  . LUMIGAN 0.01 % SOLN Place 1 drop into the left eye at bedtime.  12  . Omega-3 Fatty Acids (OMEGA-3 FISH OIL) 300 MG CAPS Take 1 capsule by mouth daily.    . Probiotic Product (PROBIOTIC DAILY PO) Take 2 tablets by mouth daily.     . Psyllium (METAMUCIL FIBER)  51.7 % PACK Take 1 packet by mouth daily.    Marland Kitchen SIMBRINZA 1-0.2 % SUSP Place 1 drop into the left eye 2 (two) times daily.    Marland Kitchen HYDROcodone-acetaminophen (NORCO/VICODIN) 5-325 MG tablet Take 1-2 tablets by mouth every 6 (six) hours as needed for moderate pain. 30 tablet 0   No facility-administered medications prior to visit.     No Known Allergies  ROS As per HPI  PE: Blood pressure 120/72, pulse 66, temperature 98.2 F (36.8 C), temperature source Oral, resp. rate 16, height 6\' 1"  (1.854 m), weight 165 lb (74.8 kg), SpO2 99 %. Gen: Alert, well appearing.  Patient is oriented to person, place, time, and  situation. AFFECT: pleasant, lucid thought and speech. No further exam today.  LABS:    Chemistry      Component Value Date/Time   NA 141 12/15/2017 0941   K 4.3 12/15/2017 0941   CL 106 12/15/2017 0941   CO2 28 12/15/2017 0941   BUN 14 12/15/2017 0941   CREATININE 1.12 12/15/2017 0941      Component Value Date/Time   CALCIUM 9.0 12/15/2017 0941   ALKPHOS 64 12/12/2016 0846   AST 15 12/15/2017 0941   ALT 14 12/15/2017 0941   BILITOT 0.6 12/15/2017 0941       IMPRESSION AND PLAN:  Chronic pain syndrome: osteoarthritic neck pain + bilat intermittent radiculopathy pain in both hands, with additional idiopathic/familial LE PN----doing well, using vicodin very sparingly. I did electronic rx's for vicodin 5/325, 1-2 q6h prn moderate pain, #30 today.   CSC UTD. We'll do UDS at next f/u visit.  An After Visit Summary was printed and given to the patient.  FOLLOW UP: Return in about 3 months (around 06/22/2018) for routine chronic illness f/u /pain.  Signed:  Crissie Sickles, MD           03/23/2018

## 2018-06-26 ENCOUNTER — Ambulatory Visit: Payer: 59 | Admitting: Family Medicine

## 2018-07-03 ENCOUNTER — Ambulatory Visit (INDEPENDENT_AMBULATORY_CARE_PROVIDER_SITE_OTHER): Payer: 59 | Admitting: Family Medicine

## 2018-07-03 ENCOUNTER — Encounter: Payer: Self-pay | Admitting: Family Medicine

## 2018-07-03 VITALS — BP 123/74 | HR 63 | Temp 98.3°F | Resp 16 | Ht 73.0 in | Wt 171.1 lb

## 2018-07-03 DIAGNOSIS — R454 Irritability and anger: Secondary | ICD-10-CM | POA: Diagnosis not present

## 2018-07-03 DIAGNOSIS — F411 Generalized anxiety disorder: Secondary | ICD-10-CM | POA: Diagnosis not present

## 2018-07-03 DIAGNOSIS — M542 Cervicalgia: Secondary | ICD-10-CM

## 2018-07-03 DIAGNOSIS — G894 Chronic pain syndrome: Secondary | ICD-10-CM | POA: Diagnosis not present

## 2018-07-03 MED ORDER — BUSPIRONE HCL 15 MG PO TABS: ORAL_TABLET | ORAL | 1 refills | Status: DC

## 2018-07-03 NOTE — Progress Notes (Signed)
OFFICE VISIT  07/03/2018   CC:  Chief Complaint  Patient presents with  . Follow-up    RCI, pt is not fasting.    HPI:    Patient is a 58 y.o. Caucasian male who presents for 3 mo f/u chronic pain syndrome.  Indication for chronic opioid: Has idiopathic/familial peripheral neuropathy resulting in chronic bilat LL pain for which he takes gabapentin scheduled and small amounts of vicodin on prn basis.  Also has some neck pain/stiffness that he uses this for (also describes intermittent bothersome pain/tingling in both hands that are a result of his neck DDD and the surgery that was required for this problem). Medication and dose: Vicodin 5/325, 1-2 q6h prn moderate pain. # pills per month: 30 (lasts > 1 mo). Last UDS date: none Opioid Treatment Agreement signed (Y/N): Y, 12/20/17 Opioid Treatment Agreement last reviewed with patient:  today [place] reviewed this encounter (include red flags):  yes, no red flags.  Last fill of vicodin was  03/27/18.  Says neck pain is stable--"status quo".  Deals with it all the time at moderate level, says ibuprofen is what he takes most of the time, resorts to a dose of vicodin an average of once per MONTH.  Does not need RF today. Still working on gabapentin dose for his neuropathy (with his neurologist): currently taking 600 mg qAM and 900 mg qpm.  He brings up his anxiety and anger control problems today, says he has always been like this. Describes some chronic worry, irritability, feels keyed up, "short fuse", often regrets the way he acts after he calms down. Feels like he has never been really happy in his life, but denies sadness or depressed mood.  Describes himself as socially awkward much of the time, has trouble looking people in the eyes much, has some OCD personality tendencies. Has never had hypomanic or manic periods.  His mom tells him that his dad was just like him and took a med called buspar and it helped him tremendously.  Pt asks what I  think about trying this med and/or going to a counselor for anger management.  Denies SI or HI.  Past Medical History:  Diagnosis Date  . BPH (benign prostatic hypertrophy)   . Cervical spondylosis    with radiculopathy not responsive to conservative therapies:  [facility] 02/2015: ACDF surgery C5-C7 performed 04/05/15  . Dysplastic nevus 10/2015   left mid back: Dr. Denna Haggard following  . Glaucoma   . History of adenomatous polyp of colon   . Lateral epicondylitis of left elbow   . Neuropathic pain   . Peripheral neuropathy    Idiopathic, inherited per pt's neurologist, Dr. Everette Rank. Various labs normal 07/2016 except vitamin B6 was siginificantly elevated.  Allergist eval/allergy testing (including food) ALL NORMAL/NEG 07/2016.  Marland Kitchen Restless legs syndrome 09/27/2014  . Throat irritation    chronic (since cervical spine surgery 2016).  Dr. Erik Obey is working him up as of 10/2015 (laryngoscopy normal; trial of protonix started)    Past Surgical History:  Procedure Laterality Date  . Ant cerv decompression/discectomy w/fusion C 5-7  04/05/15   C5-7 ACDF  . CATARACT EXTRACTION Right    2010  . COLONOSCOPY W/ POLYPECTOMY  08/2012; 05/23/16   2017, adenomatous polyp--recall 5 yrs.  Marland Kitchen EYE SURGERY Right   . TRABECULECTOMY  2009   for uncontrolled glaucoma R eye  . TRANSURETHRAL RESECTION OF PROSTATE  2008   due to enlarged prostate  . VASECTOMY  2001  Outpatient Medications Prior to Visit  Medication Sig Dispense Refill  . Alpha-Lipoic Acid 300 MG CAPS Take 1 capsule by mouth 2 (two) times daily.    . Coenzyme Q10 (CO Q 10) 100 MG CAPS Take 100 mg by mouth daily.    Marland Kitchen gabapentin (NEURONTIN) 300 MG capsule Take 1 capsules bid (Patient taking differently: Take 600 mg by mouth 2 (two) times daily. ) 120 capsule 0  . HYDROcodone-acetaminophen (NORCO/VICODIN) 5-325 MG tablet Take 1-2 tablets by mouth every 6 (six) hours as needed for moderate pain. 30 tablet 0  . LUMIGAN 0.01 % SOLN  Place 1 drop into the left eye at bedtime.  12  . Omega-3 Fatty Acids (OMEGA-3 FISH OIL) 300 MG CAPS Take 1 capsule by mouth daily.    . Probiotic Product (PROBIOTIC DAILY PO) Take 2 tablets by mouth daily.     . Psyllium (METAMUCIL FIBER) 51.7 % PACK Take 1 packet by mouth daily.    Marland Kitchen SIMBRINZA 1-0.2 % SUSP Place 1 drop into the left eye 2 (two) times daily.     No facility-administered medications prior to visit.     No Known Allergies  ROS As per HPI  PE: Blood pressure 123/74, pulse 63, temperature 98.3 F (36.8 C), temperature source Oral, resp. rate 16, height 6\' 1"  (1.854 m), weight 171 lb 2 oz (77.6 kg), SpO2 100 %. Gen: Alert, well appearing.  Patient is oriented to person, place, time, and situation. AFFECT: pleasant, lucid thought and speech. Makes appropriate eye contact. Neck: ROM limited by about 30% in all motions by stiffness and pain.  No tenderness to palpation. UE strength 5/5 prox/dist,  Unable to elicit DTRs in triceps, biceps, or brachiorad areas on either side.    LABS:    Chemistry      Component Value Date/Time   NA 141 12/15/2017 0941   K 4.3 12/15/2017 0941   CL 106 12/15/2017 0941   CO2 28 12/15/2017 0941   BUN 14 12/15/2017 0941   CREATININE 1.12 12/15/2017 0941      Component Value Date/Time   CALCIUM 9.0 12/15/2017 0941   ALKPHOS 64 12/12/2016 0846   AST 15 12/15/2017 0941   ALT 14 12/15/2017 0941   BILITOT 0.6 12/15/2017 0941      IMPRESSION AND PLAN:  1) Chronic pain syndrome: cervicalgia.  Stable. He did not need a new rx for vicodin today.    2) Chronic anxiety/anger control problems: he is interested in a trial of buspar b/c it helped his father tremendously. We discussed this med in depth today and decided on a trial: 15mg  bid x 5d, then increase to 15mg  tid. He is currently looking for a counselor who specializes in anger management.  He declined my offer today to get him set up with a psychologist. I will check with him about  this again at his next f/u in 1 mo.  Spent 25 min with pt today, with >50% of this time spent in counseling and care coordination regarding the above problems.  An After Visit Summary was printed and given to the patient.  FOLLOW UP: Return in about 4 weeks (around 07/31/2018) for f/u anxiety (30 min).  Signed:  Crissie Sickles, MD           07/03/2018

## 2018-07-03 NOTE — Patient Instructions (Signed)
Take one buspirone tab twice per day for 5 days, then increase to 1 tab three times per day. Stay on this dose until I see you again in 1 month.

## 2018-07-30 ENCOUNTER — Encounter: Payer: Self-pay | Admitting: *Deleted

## 2018-07-31 ENCOUNTER — Ambulatory Visit (INDEPENDENT_AMBULATORY_CARE_PROVIDER_SITE_OTHER): Payer: 59 | Admitting: Family Medicine

## 2018-07-31 ENCOUNTER — Encounter: Payer: Self-pay | Admitting: Family Medicine

## 2018-07-31 VITALS — BP 98/61 | HR 61 | Temp 98.0°F | Resp 16 | Ht 73.0 in | Wt 168.5 lb

## 2018-07-31 DIAGNOSIS — R197 Diarrhea, unspecified: Secondary | ICD-10-CM | POA: Diagnosis not present

## 2018-07-31 DIAGNOSIS — R4587 Impulsiveness: Secondary | ICD-10-CM | POA: Diagnosis not present

## 2018-07-31 DIAGNOSIS — R140 Abdominal distension (gaseous): Secondary | ICD-10-CM

## 2018-07-31 DIAGNOSIS — R454 Irritability and anger: Secondary | ICD-10-CM

## 2018-07-31 DIAGNOSIS — R109 Unspecified abdominal pain: Secondary | ICD-10-CM

## 2018-07-31 NOTE — Patient Instructions (Signed)
Take generic over the counter pepcid 20mg  twice per day for 2 weeks.  Consider stopping your probiotic.  OK to stop buspirone without tapering off of it.

## 2018-07-31 NOTE — Progress Notes (Signed)
OFFICE VISIT  07/31/2018   CC:  Chief Complaint  Patient presents with  . Anxiety    F/U for starting Buspar. Pt states it is not working at all    HPI:    Patient is a 58 y.o. Caucasian male who presents for 1 mo f/u anxiety/anger control problems. Got him started on buspar last visit.   Not helping impulsivity or anger control at all.  Possibly helps a little bit with worry but he is not really positive. Side effect : vague dizzy feeling off and on.  He had hoped this med would help b/c it apparently helped his dad with same sx's. Pt not really interested in antidepressant trial b/c he has had negative experiences with these when treated with them in the past for his peripheral neuropathy.   We discussed a few meds commonly used for aggressive behavior, anger control probs, impulsivity problems-->depakote, atypical antipsychotics.  I outlined the potential side effects of these. He is hesitant to try these.  He has an additional problem to discuss today: GI problems the last 2 months or so.   Usual bowel habits: one formed, large bm every 2-3 days, normal brown color.  NO excessive straining, no blood, no pain w/bM. The last 2 months he has noted a change of his BMs to being much looser, darker brown, more foul smelling, and sticky. No BRBPR.  Has lots more gas with this as well as intermittent crampy abdominal pain in umbilical region.  Has noted increased feeling of ST in mornings--this is same way he felt when GER was a problem for him in the past.  He changed his diet and his GER responded.  For a reason he did not care to explain, he is against using PPIs, but is ok with taking H2 blocker.  He only occasionally feels classic GER sx's and he'll take pepcid 33m otc and it helps.  He only uses it rarely.  No fevers, no wt loss, no abd distention, no n/v.  His GI sx's are NOT any worse postprandially than other times, and he recalls no change in diet preceding his recent bowel problems.   No preceding gastroenteritis sx's, no antibiotic use in the months prior.   He denies every having these sx's for a 2 month period in the past. He has never seen a GI MD. He does not avoid gluten or dairy in his diet.  Past Medical History:  Diagnosis Date  . BPH (benign prostatic hypertrophy)   . Cervical spondylosis    with radiculopathy not responsive to conservative therapies:  [facility]: ACDF surgery C5-C7 performed 04/05/15  . Chronic pain syndrome    cervicalgia  . Difficulty controlling anger    + GAD with OCD tendencies.  .Marland KitchenDysplastic nevus 10/2015   left mid back: Dr. TDenna Haggardfollowing  . Glaucoma   . History of adenomatous polyp of colon   . Lateral epicondylitis of left elbow   . Neuropathic pain   . Peripheral neuropathy    Idiopathic, inherited per pt's neurologist, Dr. TEverette Rank Various labs normal 07/2016 except vitamin B6 was siginificantly elevated.  Allergist eval/allergy testing (including food) ALL NORMAL/NEG 07/2016.  .Marland KitchenRestless legs syndrome 09/27/2014  . Throat irritation    chronic (since cervical spine surgery 2016).  Dr. WErik Obeyis working him up as of 10/2015 (laryngoscopy normal; trial of protonix started)    Past Surgical History:  Procedure Laterality Date  . Ant cerv decompression/discectomy w/fusion C 5-7  04/05/15   C5-7 ACDF  . CATARACT EXTRACTION Right    2010  . COLONOSCOPY W/ POLYPECTOMY  08/2012; 05/23/16   2017, adenomatous polyp--recall 5 yrs.  Marland Kitchen EYE SURGERY Right   . TRABECULECTOMY  2009   for uncontrolled glaucoma R eye  . TRANSURETHRAL RESECTION OF PROSTATE  2008   due to enlarged prostate  . VASECTOMY  2001    Outpatient Medications Prior to Visit  Medication Sig Dispense Refill  . Alpha-Lipoic Acid 300 MG CAPS Take 1 capsule by mouth 2 (two) times daily.    . busPIRone (BUSPAR) 15 MG tablet 1 tab po tid 90 tablet 1  . Coenzyme Q10 (CO Q 10) 100 MG CAPS Take 100 mg by mouth daily.    Marland Kitchen gabapentin (NEURONTIN)  300 MG capsule Take 1 capsules bid (Patient taking differently: Take 600 mg by mouth 2 (two) times daily. 3 tabs in the AM and 3 tabs in the PM) 120 capsule 0  . HYDROcodone-acetaminophen (NORCO/VICODIN) 5-325 MG tablet Take 1-2 tablets by mouth every 6 (six) hours as needed for moderate pain. 30 tablet 0  . LUMIGAN 0.01 % SOLN Place 1 drop into the left eye at bedtime.  12  . Omega-3 Fatty Acids (OMEGA-3 FISH OIL) 300 MG CAPS Take 1 capsule by mouth daily.    . Probiotic Product (PROBIOTIC DAILY PO) Take 2 tablets by mouth daily.     . Psyllium (METAMUCIL FIBER) 51.7 % PACK Take 1 packet by mouth daily.    Marland Kitchen SIMBRINZA 1-0.2 % SUSP Place 1 drop into the left eye 2 (two) times daily.     No facility-administered medications prior to visit.     No Known Allergies  ROS As per HPI  PE: Blood pressure 98/61, pulse 61, temperature 98 F (36.7 C), temperature source Oral, resp. rate 16, height _0  (1.854 m), weight 168 lb 8 oz (76.4 kg), SpO2 97 %. Gen: Alert, well appearing.  Patient is oriented to person, place, time, and situation. AFFECT: pleasant, lucid thought and speech. CV: RRR, no m/r/g.   LUNGS: CTA bilat, nonlabored resps, good aeration in all lung fields. ABD: soft, mild discomfort to palpation in subxyphoid region of abdomen but , ND, BS normal.  No hepatospenomegaly or mass.  No bruits. EXT: no clubbing or cyanosis.  no edema.  No pallor or jaundice.   LABS:  Lab Results  Component Value Date   TSH 1.64 12/15/2017   Lab Results  Component Value Date   WBC 4.9 12/15/2017   HGB 15.3 12/15/2017   HCT 44.5 12/15/2017   MCV 88.3 12/15/2017   PLT 161 12/15/2017   Lab Results  Component Value Date   CREATININE 1.12 12/15/2017   BUN 14 12/15/2017   NA 141 12/15/2017   K 4.3 12/15/2017   CL 106 12/15/2017   CO2 28 12/15/2017   Lab Results  Component Value Date   ALT 14 12/15/2017   AST 15 12/15/2017   ALKPHOS 64 12/12/2016   BILITOT 0.6 12/15/2017   Lab  Results  Component Value Date   CHOL 151 12/15/2017   Lab Results  Component Value Date   HDL 69 12/15/2017   Lab Results  Component Value Date   LDLCALC 62 12/15/2017   Lab Results  Component Value Date   TRIG 112 12/15/2017   Lab Results  Component Value Date   CHOLHDL 2.2 12/15/2017   Lab Results  Component Value Date   PSA 1.3 12/15/2017  PSA 1.1 12/12/2016   PSA 1.71 12/12/2015     IMPRESSION AND PLAN:  1) Anxiety, mild chronic.  Main issue for him is anger control/impulse control problems: He did not have any response to 1 mo trial of buspar. Needs SSRI trial but he declines due to past experience with these being neg. He is seeking a Social worker.  No new med trial today. He'll go ahead and d/c buspar at this time.  2) Functional GI symptoms: I recommended he stop his probiotic that he has been taking long term bid. NO red flag symptoms. Will check CBC, CMET, TTG IgA and IgA total, ESR, and lipase. Stool studies: giardia/crypto, ova and parasite, bact culx, c diff pcr, fecal lactoferrin. Considering GI referral.  An After Visit Summary was printed and given to the patient.  FOLLOW UP: f/u GI sx's in 2 wks  Signed:  Crissie Sickles, MD           08/03/2018

## 2018-08-04 ENCOUNTER — Encounter: Payer: Self-pay | Admitting: *Deleted

## 2018-08-04 DIAGNOSIS — Z961 Presence of intraocular lens: Secondary | ICD-10-CM | POA: Insufficient documentation

## 2018-08-04 LAB — CBC WITH DIFFERENTIAL/PLATELET
ABSOLUTE MONOCYTES: 500 {cells}/uL (ref 200–950)
BASOS PCT: 0.7 %
Basophils Absolute: 43 cells/uL (ref 0–200)
Eosinophils Absolute: 232 cells/uL (ref 15–500)
Eosinophils Relative: 3.8 %
HCT: 49.3 % (ref 38.5–50.0)
Hemoglobin: 16.9 g/dL (ref 13.2–17.1)
LYMPHS ABS: 1739 {cells}/uL (ref 850–3900)
MCH: 30.8 pg (ref 27.0–33.0)
MCHC: 34.3 g/dL (ref 32.0–36.0)
MCV: 89.8 fL (ref 80.0–100.0)
MPV: 10.9 fL (ref 7.5–12.5)
Monocytes Relative: 8.2 %
Neutro Abs: 3587 cells/uL (ref 1500–7800)
Neutrophils Relative %: 58.8 %
Platelets: 184 10*3/uL (ref 140–400)
RBC: 5.49 10*6/uL (ref 4.20–5.80)
RDW: 12.7 % (ref 11.0–15.0)
TOTAL LYMPHOCYTE: 28.5 %
WBC: 6.1 10*3/uL (ref 3.8–10.8)

## 2018-08-04 LAB — COMPREHENSIVE METABOLIC PANEL
AG Ratio: 2.2 (calc) (ref 1.0–2.5)
ALBUMIN MSPROF: 5 g/dL (ref 3.6–5.1)
ALKALINE PHOSPHATASE (APISO): 63 U/L (ref 35–144)
ALT: 21 U/L (ref 9–46)
AST: 23 U/L (ref 10–35)
BUN: 22 mg/dL (ref 7–25)
CHLORIDE: 103 mmol/L (ref 98–110)
CO2: 29 mmol/L (ref 20–32)
Calcium: 10 mg/dL (ref 8.6–10.3)
Creat: 1.23 mg/dL (ref 0.70–1.33)
GLOBULIN: 2.3 g/dL (ref 1.9–3.7)
Glucose, Bld: 88 mg/dL (ref 65–99)
POTASSIUM: 4.7 mmol/L (ref 3.5–5.3)
SODIUM: 140 mmol/L (ref 135–146)
Total Bilirubin: 0.6 mg/dL (ref 0.2–1.2)
Total Protein: 7.3 g/dL (ref 6.1–8.1)

## 2018-08-04 LAB — IGA: Immunoglobulin A: 247 mg/dL (ref 47–310)

## 2018-08-04 LAB — SEDIMENTATION RATE: Sed Rate: 2 mm/h (ref 0–20)

## 2018-08-04 LAB — LIPASE: Lipase: 40 U/L (ref 7–60)

## 2018-08-04 LAB — TISSUE TRANSGLUTAMINASE, IGA: (tTG) Ab, IgA: 1 U/mL

## 2018-08-06 ENCOUNTER — Other Ambulatory Visit: Payer: Self-pay | Admitting: *Deleted

## 2018-08-06 DIAGNOSIS — R197 Diarrhea, unspecified: Secondary | ICD-10-CM

## 2018-08-14 ENCOUNTER — Other Ambulatory Visit: Payer: Self-pay | Admitting: Family Medicine

## 2018-08-14 DIAGNOSIS — R1033 Periumbilical pain: Secondary | ICD-10-CM

## 2018-08-14 DIAGNOSIS — R194 Change in bowel habit: Secondary | ICD-10-CM

## 2018-08-14 DIAGNOSIS — R197 Diarrhea, unspecified: Secondary | ICD-10-CM

## 2018-08-14 LAB — GIARDIA/CRYPTOSPORIDIUM (EIA)
MICRO NUMBER:: 193373
MICRO NUMBER:: 193375
RESULT:: NOT DETECTED
RESULT:: NOT DETECTED
SPECIMEN QUALITY:: ADEQUATE
SPECIMEN QUALITY:: ADEQUATE

## 2018-08-14 LAB — STOOL CULTURE
MICRO NUMBER:: 193372
MICRO NUMBER:: 193374
MICRO NUMBER:: 193378
SHIGA RESULT:: NOT DETECTED
SPECIMEN QUALITY: ADEQUATE
SPECIMEN QUALITY:: ADEQUATE
SPECIMEN QUALITY:: ADEQUATE

## 2018-08-14 LAB — FECAL LACTOFERRIN, QUANT
Fecal Lactoferrin: NEGATIVE
MICRO NUMBER:: 193376
SPECIMEN QUALITY: ADEQUATE

## 2018-08-14 LAB — OVA AND PARASITE EXAMINATION
CONCENTRATE RESULT:: NONE SEEN
MICRO NUMBER:: 193377
SPECIMEN QUALITY:: ADEQUATE
TRICHROME RESULT: NONE SEEN

## 2018-08-14 LAB — CLOSTRIDIUM DIFFICILE TOXIN B, QUALITATIVE, REAL-TIME PCR: Toxigenic C. Difficile by PCR: NOT DETECTED

## 2018-08-24 ENCOUNTER — Encounter: Payer: Self-pay | Admitting: Gastroenterology

## 2018-10-06 ENCOUNTER — Ambulatory Visit (INDEPENDENT_AMBULATORY_CARE_PROVIDER_SITE_OTHER): Payer: 59 | Admitting: Gastroenterology

## 2018-10-06 ENCOUNTER — Other Ambulatory Visit: Payer: Self-pay

## 2018-10-06 ENCOUNTER — Encounter: Payer: Self-pay | Admitting: Gastroenterology

## 2018-10-06 VITALS — Ht 73.0 in | Wt 165.0 lb

## 2018-10-06 DIAGNOSIS — R140 Abdominal distension (gaseous): Secondary | ICD-10-CM | POA: Diagnosis not present

## 2018-10-06 DIAGNOSIS — R143 Flatulence: Secondary | ICD-10-CM

## 2018-10-06 DIAGNOSIS — R109 Unspecified abdominal pain: Secondary | ICD-10-CM

## 2018-10-06 DIAGNOSIS — K5900 Constipation, unspecified: Secondary | ICD-10-CM

## 2018-10-06 DIAGNOSIS — K581 Irritable bowel syndrome with constipation: Secondary | ICD-10-CM

## 2018-10-06 MED ORDER — DICYCLOMINE HCL 10 MG PO CAPS: 10.0000 mg | ORAL_CAPSULE | Freq: Three times a day (TID) | ORAL | 11 refills | Status: DC

## 2018-10-06 NOTE — Progress Notes (Signed)
Keith Mckee    [id number]    Aug 18, 1960  Primary Care Physician:McGowen, Adrian Blackwater, MD  Referring Physician: Tammi Sou, MD [address] [place], [place] [address]  This service was provided via audio and video telemedicine (via webex) due to [place] 19 pandemic.  Patient location: Home Provider location: Office Used 2 patient identifiers to confirm the correct person. Explained the limitations in evaluation and management via telemedicine. Patient is aware of potential medical charges for this visit.  Patient consented to this virtual visit .  The persons participating in this telemedicine service were myself and the patient   Chief complaint: Change in bowel habits   HPI:  58 year old male with complaints of worsening bowel habits.  Always had intermittent constipation with irregular bowel habits, has bowel movement once every 2 to 3 days.  In the past few weeks he is having more abdominal bloating, gas associated with severe abdominal cramping.  Still irregular bowel habits once every 2 to 3 days but stool is more sticky and pasty, is also having difficulty evacuating.  He was always taking psyllium husk, increased it to 2 scoops daily with no improvement.  He is also taking probiotic [natures bounty] 1 capsule twice daily, has multiple strains of lactobacillus based on the ingredient list.  He stopped taking probiotics and supplements including fish oil when he went for eye surgery in February, did not notice any change in his symptoms.  Denies any nausea, vomiting, dysphagia, loss of appetite, weight loss, melena or rectal bleeding.  Colonoscopy 05/23/2016: 2 mm polyp (tubular adenoma) from ascending colon, 6 mm polyp (hyperplastic ) removed from rectum and internal hemorrhoids.  Outpatient Encounter Medications as of 10/06/2018  Medication Sig  . Alpha-Lipoic Acid 300 MG CAPS Take 1 capsule by mouth 2 (two) times daily.  . Coenzyme Q10 (CO Q 10) 100  MG CAPS Take 100 mg by mouth daily.  Marland Kitchen gabapentin (NEURONTIN) 300 MG capsule Take 1 capsules bid (Patient taking differently: Take 600 mg by mouth 2 (two) times daily. 3 tabs in the AM and 3 tabs in the PM)  . HYDROcodone-acetaminophen (NORCO/VICODIN) 5-325 MG tablet Take 1-2 tablets by mouth every 6 (six) hours as needed for moderate pain.  . Omega-3 Fatty Acids (OMEGA-3 FISH OIL) 300 MG CAPS Take 1 capsule by mouth daily.  . Probiotic Product (PROBIOTIC DAILY PO) Take 2 tablets by mouth daily.   . Psyllium (METAMUCIL FIBER) 51.7 % PACK Take 1 packet by mouth daily.  Marland Kitchen SIMBRINZA 1-0.2 % SUSP Place 1 drop into the left eye 2 (two) times daily.  . [DISCONTINUED] LUMIGAN 0.01 % SOLN Place 1 drop into the left eye at bedtime.   No facility-administered encounter medications on file as of 10/06/2018.     Allergies as of 10/06/2018  . (No Known Allergies)    Past Medical History:  Diagnosis Date  . BPH (benign prostatic hypertrophy)   . Cervical spondylosis    with radiculopathy not responsive to conservative therapies:  [facility]: ACDF surgery C5-C7 performed 04/05/15  . Chronic pain syndrome    cervicalgia  . Difficulty controlling anger    + GAD with OCD tendencies.  Marland Kitchen Dysplastic nevus 10/2015   left mid back: Dr. Denna Haggard following  . Glaucoma   . History of adenomatous polyp of colon   . Lateral epicondylitis of left elbow   . Neuropathic pain   . Peripheral  neuropathy    Idiopathic, inherited per pt's neurologist, Dr. Everette Rank. Various labs normal 07/2016 except vitamin B6 was siginificantly elevated.  Allergist eval/allergy testing (including food) ALL NORMAL/NEG 07/2016.  Marland Kitchen Restless legs syndrome 09/27/2014  . Throat irritation    chronic (since cervical spine surgery 2016).  Dr. Erik Obey is working him up as of 10/2015 (laryngoscopy normal; trial of protonix started)    Past Surgical History:  Procedure Laterality Date  . Ant cerv decompression/discectomy  w/fusion C 5-7  04/05/15   C5-7 ACDF  . CATARACT EXTRACTION Right    2010  . COLONOSCOPY W/ POLYPECTOMY  08/2012; 05/23/16   2017, adenomatous polyp--recall 5 yrs.  Marland Kitchen EYE SURGERY Right   . TRABECULECTOMY  2009   for uncontrolled glaucoma R eye  . TRANSURETHRAL RESECTION OF PROSTATE  2008   due to enlarged prostate  . VASECTOMY  2001    Family History  Problem Relation Age of Onset  . Glaucoma Father   . Heart disease Father   . Neuropathy Father        Peripheral neuropathy  . Arthritis Father   . Parkinson's disease Father   . Colon cancer Neg Hx   . Rectal cancer Neg Hx     Social History   Socioeconomic History  . Marital status: Married    Spouse name: Not on file  . Number of children: 2  . Years of education: Master D.  . Highest education level: Not on file  Occupational History  . Occupation: Immunologist: Building surveyor FOR SELF EMPLOYED    Comment: [employer]  . Occupation: Animator  Social Needs  . Financial resource strain: Not on file  . Food insecurity:    Worry: Not on file    Inability: Not on file  . Transportation needs:    Medical: Not on file    Non-medical: Not on file  Tobacco Use  . Smoking status: Former Smoker    Packs/day: 0.75    Years: 15.00    Pack years: 11.25    Types: Cigarettes    Last attempt to quit: 06/24/1998    Years since quitting: 20.2  . Smokeless tobacco: Never Used  . Tobacco comment: 2 pack per week  Substance and Sexual Activity  . Alcohol use: Yes    Alcohol/week: 12.0 standard drinks    Types: 12 Cans of beer per week    Comment: Patient drinks 12 beers a week  . Drug use: No  . Sexual activity: Not on file  Lifestyle  . Physical activity:    Days per week: Not on file    Minutes per session: Not on file  . Stress: Not on file  Relationships  . Social connections:    Talks on phone: Not on file    Gets together: Not on file    Attends religious service: Not on file     Active member of club or organization: Not on file    Attends meetings of clubs or organizations: Not on file    Relationship status: Not on file  . Intimate partner violence:    Fear of current or ex partner: Not on file    Emotionally abused: Not on file    Physically abused: Not on file    Forced sexual activity: Not on file  Other Topics Concern  . Not on file  Social History Narrative   Married.   Educ: MBA   Occupation: Real estate  [employer]   No tob, occ alc, no drugs.   Exercises regularly.   Patient is right handed.   Patient drinks 1 large cup of coffee daily.      Review of systems: Review of Systems as per HPI All other systems reviewed and are negative.   Physical Exam: Vitals were not taken and physical exam was not performed during this virtual visit.  Data Reviewed:  Reviewed labs, radiology imaging, old records and pertinent past GI work up   Assessment and Plan/Recommendations:  58 year old male with chronic constipation, bloating, excessive flatulence and abdominal cramping. Irritable bowel syndrome with predominant constipation  Water intake to 8 to 10 cups daily  Split the dose of psyllium husk to 1 to 2 teaspoons 3 times daily instead of taking 2 full scoops in the morning.  If continues to have persistent bloating and excessive flatulence, switch to soluble fiber like Benefiber 1 teaspoon 3 times daily.  Stop probiotic  Trial of dicyclomine 10 mg up to 3 times daily as needed for intermittent abdominal cramping and IBS symptoms  If continues to have persistent symptoms we will consider adding laxative and also evaluate for possible small intestinal bacterial overgrowth     K. Denzil Magnuson , MD   CC: McGowen, Adrian Blackwater, MD

## 2018-10-06 NOTE — Patient Instructions (Signed)
Constipation handout  Increase water intake to 8 to 10 cups daily  Ok to continue with psyllium husk, change to 1-2 teaspoon 3 times daily and stop taking all of it in the morning. If still having excessive gas switch to bran base soluble fiber similar to Benefiber 1 teaspoon 3 times daily.  Stop probiotic, instead consider plain yogurt  Dicyclomine 10 mg up to 3 times daily as needed for abdominal cramping (please send prescription to [facility] in [place])  Please contact us if symptoms do not improve within the next 1 to 2 weeks

## 2018-10-19 ENCOUNTER — Encounter: Payer: Self-pay | Admitting: Family Medicine

## 2019-01-07 ENCOUNTER — Other Ambulatory Visit: Payer: Self-pay

## 2019-01-14 DIAGNOSIS — H532 Diplopia: Secondary | ICD-10-CM | POA: Insufficient documentation

## 2019-01-26 DIAGNOSIS — Z9889 Other specified postprocedural states: Secondary | ICD-10-CM | POA: Insufficient documentation

## 2019-01-26 DIAGNOSIS — H52211 Irregular astigmatism, right eye: Secondary | ICD-10-CM | POA: Insufficient documentation

## 2019-02-12 ENCOUNTER — Other Ambulatory Visit: Payer: Self-pay | Admitting: Family Medicine

## 2019-02-12 ENCOUNTER — Encounter: Payer: Self-pay | Admitting: Family Medicine

## 2019-02-12 NOTE — Telephone Encounter (Signed)
RF request for hydrocodone Last OV 07/31/2018 No upcoming appt Last RX 03/23/2018 # 30, no refills.  Please advise.

## 2019-02-16 MED ORDER — HYDROCODONE-ACETAMINOPHEN 5-325 MG PO TABS: 1.0000 | ORAL_TABLET | Freq: Four times a day (QID) | ORAL | 0 refills | Status: DC | PRN

## 2019-02-16 NOTE — Telephone Encounter (Signed)
Left detailed message for patient to CB to schedule OV at earliest convenience.

## 2019-02-16 NOTE — Telephone Encounter (Signed)
OK, med eRx'd. Pt needs appt for f/u chronic pain syndrome at his earliest convenience.-thx

## 2019-02-25 ENCOUNTER — Other Ambulatory Visit: Payer: Self-pay

## 2019-02-25 ENCOUNTER — Ambulatory Visit (INDEPENDENT_AMBULATORY_CARE_PROVIDER_SITE_OTHER): Payer: 59 | Admitting: Family Medicine

## 2019-02-25 ENCOUNTER — Encounter: Payer: Self-pay | Admitting: Family Medicine

## 2019-02-25 VITALS — BP 111/72 | HR 64 | Temp 97.6°F | Resp 16 | Ht 73.0 in | Wt 164.4 lb

## 2019-02-25 DIAGNOSIS — R454 Irritability and anger: Secondary | ICD-10-CM

## 2019-02-25 DIAGNOSIS — Z23 Encounter for immunization: Secondary | ICD-10-CM

## 2019-02-25 DIAGNOSIS — G609 Hereditary and idiopathic neuropathy, unspecified: Secondary | ICD-10-CM | POA: Diagnosis not present

## 2019-02-25 DIAGNOSIS — G894 Chronic pain syndrome: Secondary | ICD-10-CM

## 2019-02-25 DIAGNOSIS — F411 Generalized anxiety disorder: Secondary | ICD-10-CM | POA: Diagnosis not present

## 2019-02-25 MED ORDER — FLUOXETINE HCL 20 MG PO TABS: 20.0000 mg | ORAL_TABLET | Freq: Every day | ORAL | 1 refills | Status: DC

## 2019-02-25 MED ORDER — CLONAZEPAM 0.5 MG PO TABS: ORAL_TABLET | ORAL | 1 refills | Status: DC

## 2019-02-25 NOTE — Progress Notes (Signed)
OFFICE VISIT  02/25/2019   CC:  Chief Complaint  Patient presents with  . Sore Throat    off and on for months  . Anxiety    comes and goes, possibly stress-related   HPI:    Patient is a 58 y.o. Caucasian male who presents for "sore throat, anxiety, irregular heartbeat". Having probs with a neighbor, anger, lots of perseveration on these worries, poor sleep, periods of heart beating fast/irregular.  Says he had a better week so far this week b/c he had a long talk with  Says throat still feels irritated, waxing and waning, scares him b/c he's worried about cancer. See w/u in [facility] section below.  He stopped fiber and his bloating/gas/cramping has resolved. Still with irregular BMs.  Past Medical History:  Diagnosis Date  . BPH (benign prostatic hypertrophy)   . Cervical spondylosis    with radiculopathy not responsive to conservative therapies:  [facility] 02/[facility]: ACDF surgery C5-C7 performed 04/05/15  . Chronic pain syndrome    cervicalgia  . Difficulty controlling anger    + GAD with OCD tendencies.  Marland Kitchen Dysplastic nevus 10/2015   left mid back: Dr. Denna Haggard following  . Glaucoma   . History of adenomatous polyp of colon   . IBS (irritable bowel syndrome)    +constipation  . Lateral epicondylitis of left elbow   . Neuropathic pain   . Peripheral neuropathy    Idiopathic, inherited per pt's neurologist, Dr. Everette Rank. Various labs normal 07/2016 except vitamin B6 was siginificantly elevated.  Allergist eval/allergy testing (including food) ALL NORMAL/NEG 07/2016.  Marland Kitchen Restless legs syndrome 09/27/2014  . Throat irritation    chronic (since cervical spine surgery 2016).  Dr. Erik Obey [id number] normal, CT sinuses normal, barium swallow normal, trial of protonix no help.     Past Surgical History:  Procedure Laterality Date  . Ant cerv decompression/discectomy w/fusion C 5-7  04/05/15   C5-7 ACDF  . CATARACT EXTRACTION Right    2010  . COLONOSCOPY W/  POLYPECTOMY  08/2012; 05/23/16   2017, adenomatous polyp--recall 5 yrs.  Marland Kitchen EYE SURGERY Right   . TRABECULECTOMY  2009   for uncontrolled glaucoma R eye  . TRANSURETHRAL RESECTION OF PROSTATE  2008   due to enlarged prostate  . VASECTOMY  2001    Outpatient Medications Prior to Visit  Medication Sig Dispense Refill  . Alpha-Lipoic Acid 300 MG CAPS Take 1 capsule by mouth 2 (two) times daily.    Marland Kitchen gabapentin (NEURONTIN) 300 MG capsule Take 1 capsules bid (Patient taking differently: Take 600 mg by mouth 2 (two) times daily. 3 tabs in the AM and 3 tabs in the PM) 120 capsule 0  . HYDROcodone-acetaminophen (NORCO/VICODIN) 5-325 MG tablet Take 1-2 tablets by mouth every 6 (six) hours as needed for moderate pain. 30 tablet 0  . Magnesium 300 MG CAPS Take by mouth daily.    . Omega-3 Fatty Acids (OMEGA-3 FISH OIL) 300 MG CAPS Take 1 capsule by mouth daily.    Marland Kitchen SIMBRINZA 1-0.2 % SUSP Place 1 drop into the left eye 2 (two) times daily.    . Coenzyme Q10 (CO Q 10) 100 MG CAPS Take 100 mg by mouth daily.    Marland Kitchen dicyclomine (BENTYL) 10 MG capsule Take 1 capsule (10 mg total) by mouth 3 (three) times daily before meals. 90 capsule 11  . Probiotic Product (PROBIOTIC DAILY PO) Take 2 tablets by mouth daily.     . Psyllium (METAMUCIL FIBER)  51.7 % PACK Take 1 packet by mouth daily.     No facility-administered medications prior to visit.     No Known Allergies  ROS As per HPI  PE: Blood pressure 111/72, pulse 64, temperature 97.6 F (36.4 C), temperature source Temporal, resp. rate 16, height 6\' 1"  (1.854 m), weight 164 lb 6.4 oz (74.6 kg), SpO2 96 %. Gen: Alert, well appearing.  Patient is oriented to person, place, time, and situation. AFFECT: pleasant, lucid thought and speech. [id number]: no injection, icteris, swelling, or exudate.  EOMI, PERRLA. Mouth: lips without lesion/swelling.  Oral mucosa pink and moist. Oropharynx without erythema, exudate, or swelling.  Neck - No masses or thyromegaly  or limitation in range of motion CV: RRR, no m/r/g.   LUNGS: CTA bilat, nonlabored resps, good aeration in all lung fields. EXT: no clubbing or cyanosis.  no edema.    LABS:  Lab Results  Component Value Date   TSH 1.64 12/15/2017   Lab Results  Component Value Date   WBC 6.1 07/31/2018   HGB 16.9 07/31/2018   HCT 49.3 07/31/2018   MCV 89.8 07/31/2018   PLT 184 07/31/2018   Lab Results  Component Value Date   CREATININE 1.23 07/31/2018   BUN 22 07/31/2018   NA 140 07/31/2018   K 4.7 07/31/2018   CL 103 07/31/2018   CO2 29 07/31/2018   Lab Results  Component Value Date   ALT 21 07/31/2018   AST 23 07/31/2018   ALKPHOS 64 12/12/2016   BILITOT 0.6 07/31/2018   Lab Results  Component Value Date   CHOL 151 12/15/2017   Lab Results  Component Value Date   HDL 69 12/15/2017   Lab Results  Component Value Date   LDLCALC 62 12/15/2017   Lab Results  Component Value Date   TRIG 112 12/15/2017   Lab Results  Component Value Date   CHOLHDL 2.2 12/15/2017   Lab Results  Component Value Date   PSA 1.3 12/15/2017   PSA 1.1 12/12/2016   PSA 1.71 12/12/2015    IMPRESSION AND PLAN:  1) GAD, particular trouble with anger issues. He is agreeable to starting SSRI and also prn clonaz. Start fluoxetine 20mg  qd.   Start clonaz 0.5mg , 1-2 bid prn. Therapeutic expectations and side effect profile of medication discussed today.  Patient's questions answered.  2) Chronic pain syndrome: He uses vicodin sparingly prn worsening of his idiopathic peripheral neuropathy pain. Most recent fill of this med was 02/20/19, #30, rx'd by me.  Prior to that it was filled 03/27/18, #30, rx by me.  His use of this med has always been appropriate.  PMP aware reviewed today; no red flags. [facility] signed/in chart today. UDS at future visit. No new vicodin rx was given today.  3) Chronic throat irritation: stable.  Reassured pt no red flags for ominous cause. Past w/u 2017 by ENT  unrevealing. It is not clear if he ever really took PPI daily to try to help this. He is not really open to trying this at this time.  An After Visit Summary was printed and given to the patient.  FOLLOW UP: Return in about 4 weeks (around 03/25/2019) for f/u anx/meds.  Signed:  Crissie Sickles, MD           02/25/2019

## 2019-02-26 ENCOUNTER — Telehealth: Payer: Self-pay

## 2019-02-26 ENCOUNTER — Other Ambulatory Visit: Payer: Self-pay

## 2019-02-26 NOTE — Telephone Encounter (Signed)
PA for Fluoxetine tabs was denied. His insurance will cover Fluoxetine capsules. Are you okay with his getting caps instead of tabs?

## 2019-02-26 NOTE — Telephone Encounter (Signed)
Started a PA on covermymeds  Medication Fluoxetine HCL 20mg  tabs  Key: [id number] PA Case ID: [id number]  Will check status in 24-48 hours

## 2019-02-26 NOTE — Telephone Encounter (Signed)
Yes, capsules are good.

## 2019-02-28 ENCOUNTER — Encounter: Payer: Self-pay | Admitting: Family Medicine

## 2019-03-02 ENCOUNTER — Other Ambulatory Visit: Payer: Self-pay

## 2019-03-02 MED ORDER — FLUOXETINE HCL 20 MG PO CAPS: 20.0000 mg | ORAL_CAPSULE | Freq: Every day | ORAL | 1 refills | Status: DC

## 2019-03-02 NOTE — Addendum Note (Signed)
Addended by: Marlene Bast A on: 03/02/2019 08:09 AM   Modules accepted: Orders

## 2019-03-03 ENCOUNTER — Encounter: Payer: Self-pay | Admitting: Family Medicine

## 2019-03-29 ENCOUNTER — Other Ambulatory Visit: Payer: Self-pay

## 2019-03-29 ENCOUNTER — Ambulatory Visit (INDEPENDENT_AMBULATORY_CARE_PROVIDER_SITE_OTHER): Payer: 59 | Admitting: Family Medicine

## 2019-03-29 ENCOUNTER — Encounter: Payer: Self-pay | Admitting: Family Medicine

## 2019-03-29 VITALS — BP 126/79 | HR 44 | Temp 98.4°F | Resp 16 | Ht 73.0 in | Wt 166.2 lb

## 2019-03-29 DIAGNOSIS — F411 Generalized anxiety disorder: Secondary | ICD-10-CM | POA: Diagnosis not present

## 2019-03-29 DIAGNOSIS — R454 Irritability and anger: Secondary | ICD-10-CM

## 2019-03-29 MED ORDER — FLUOXETINE HCL 40 MG PO CAPS: 40.0000 mg | ORAL_CAPSULE | Freq: Every day | ORAL | 0 refills | Status: DC

## 2019-03-29 NOTE — Progress Notes (Signed)
OFFICE VISIT  03/29/2019   CC:  Chief Complaint  Patient presents with  . Follow-up    GAD, medication   HPI:    Patient is a 58 y.o. Caucasian male who presents for 1 mo f/u GAD with anger control problems. A/P as of last visit->"GAD, particular trouble with anger issues. He is agreeable to starting SSRI and also prn clonaz. Start fluoxetine 20mg  qd.   Start clonaz 0.5mg , 1-2 bid prn."  Interim hx: Nothing much is different.  Mild dizziness feeling occ since getting on the med. Low intensity occip HA as well, resolving. He did take the lorazepam once and this helped anxiety some, also slept better that night.    Past Medical History:  Diagnosis Date  . BPH (benign prostatic hypertrophy)   . Cervical spondylosis    with radiculopathy not responsive to conservative therapies:  [facility]: ACDF surgery C5-C7 performed 04/05/15  . Chronic pain syndrome    cervicalgia  . Difficulty controlling anger    + GAD with OCD tendencies.  Marland Kitchen Dysplastic nevus 10/2015   left mid back: Dr. Denna Haggard following  . Glaucoma   . History of adenomatous polyp of colon   . IBS (irritable bowel syndrome)    +constipation  . Lateral epicondylitis of left elbow   . Neuropathic pain   . Peripheral neuropathy    Idiopathic, inherited per pt's neurologist, Dr. Everette Rank. Various labs normal 07/2016 except vitamin B6 was siginificantly elevated.  Allergist eval/allergy testing (including food) ALL NORMAL/NEG 07/2016.  Marland Kitchen Restless legs syndrome 09/27/2014  . Throat irritation    chronic (since cervical spine surgery 2016).  Dr. Erik Obey Q000111Q normal, CT sinuses normal, barium swallow normal, trial of protonix no help.     Past Surgical History:  Procedure Laterality Date  . Ant cerv decompression/discectomy w/fusion C 5-7  04/05/15   C5-7 ACDF  . CATARACT EXTRACTION Right    2010  . COLONOSCOPY W/ POLYPECTOMY  08/2012; 05/23/16   2017, adenomatous polyp--recall 5 yrs.   Marland Kitchen EYE SURGERY Right   . TRABECULECTOMY  2009   for uncontrolled glaucoma R eye  . TRANSURETHRAL RESECTION OF PROSTATE  2008   due to enlarged prostate  . VASECTOMY  2001    Outpatient Medications Prior to Visit  Medication Sig Dispense Refill  . Alpha-Lipoic Acid 300 MG CAPS Take 1 capsule by mouth 2 (two) times daily.    . clonazePAM (KLONOPIN) 0.5 MG tablet 1-2 tabs po bid prn IF YOU HAVE INCREASED ANXIETY 30 tablet 1  . gabapentin (NEURONTIN) 300 MG capsule Take 1 capsules bid (Patient taking differently: Take 600 mg by mouth 2 (two) times daily. 3 tabs in the AM and 3 tabs in the PM) 120 capsule 0  . HYDROcodone-acetaminophen (NORCO/VICODIN) 5-325 MG tablet Take 1-2 tablets by mouth every 6 (six) hours as needed for moderate pain. 30 tablet 0  . Magnesium 300 MG CAPS Take by mouth daily.    . Omega-3 Fatty Acids (OMEGA-3 FISH OIL) 300 MG CAPS Take 1 capsule by mouth daily.    Marland Kitchen FLUoxetine (PROZAC) 20 MG capsule Take 1 capsule (20 mg total) by mouth daily. 30 capsule 1  . SIMBRINZA 1-0.2 % SUSP Place 1 drop into the left eye 2 (two) times daily.     No facility-administered medications prior to visit.     No Known Allergies  ROS As per HPI  PE: Blood pressure 126/79, pulse (!) 44, temperature 98.4 F (36.9 C), temperature  source Temporal, resp. rate 16, height 6\' 1"  (1.854 m), weight 166 lb 3.2 oz (75.4 kg), SpO2 100 %. Body mass index is 21.93 kg/m.  Gen: Alert, well appearing.  Patient is oriented to person, place, time, and situation. AFFECT: pleasant, lucid thought and speech. No further exam today.  LABS:    Chemistry      Component Value Date/Time   NA 140 07/31/2018 1626   K 4.7 07/31/2018 1626   CL 103 07/31/2018 1626   CO2 29 07/31/2018 1626   BUN 22 07/31/2018 1626   CREATININE 1.23 07/31/2018 1626      Component Value Date/Time   CALCIUM 10.0 07/31/2018 1626   ALKPHOS 64 12/12/2016 0846   AST 23 07/31/2018 1626   ALT 21 07/31/2018 1626   BILITOT 0.6  07/31/2018 1626      IMPRESSION AND PLAN:  GAD with anger control problems: no change on fluox x 1 mo.  Minimally bothersome side effects. increase fluoxetine to 40mg  qd. Continue to use lorazepam prn severe anxiety/anger->he has used this only once. Hopefully his sleep will consistently improve the longer he's on fluoxetine and as we get him on approp dose.  An After Visit Summary was printed and given to the patient.  FOLLOW UP: Return in about 4 weeks (around 04/26/2019) for f/u anx/anger.  Signed:  Crissie Sickles, MD           03/29/2019

## 2019-04-29 ENCOUNTER — Ambulatory Visit (INDEPENDENT_AMBULATORY_CARE_PROVIDER_SITE_OTHER): Payer: 59 | Admitting: Family Medicine

## 2019-04-29 ENCOUNTER — Encounter: Payer: Self-pay | Admitting: Family Medicine

## 2019-04-29 ENCOUNTER — Other Ambulatory Visit: Payer: Self-pay

## 2019-04-29 VITALS — BP 105/71 | HR 56 | Temp 98.0°F | Resp 16 | Ht 73.0 in | Wt 165.4 lb

## 2019-04-29 DIAGNOSIS — R454 Irritability and anger: Secondary | ICD-10-CM | POA: Diagnosis not present

## 2019-04-29 DIAGNOSIS — F411 Generalized anxiety disorder: Secondary | ICD-10-CM

## 2019-04-29 MED ORDER — FLUOXETINE HCL 40 MG PO CAPS: 40.0000 mg | ORAL_CAPSULE | Freq: Every day | ORAL | 3 refills | Status: DC

## 2019-04-29 MED ORDER — FLUOXETINE HCL 40 MG PO CAPS: 40.0000 mg | ORAL_CAPSULE | Freq: Every day | ORAL | 0 refills | Status: DC

## 2019-04-29 NOTE — Progress Notes (Signed)
OFFICE VISIT  04/29/2019   CC:  Chief Complaint  Keith Mckee presents with  . Follow-up    RCI   HPI:    Keith Mckee is a 58 y.o. Caucasian male who presents for 1 mo f/u GAD with anger control problems. A/P as of last f/u: "GAD with anger control problems: no change on fluox x 1 mo.  Minimally bothersome side effects. increase fluoxetine to 40mg  qd. Continue to use lorazepam prn severe anxiety/anger->he has used this only once. Hopefully his sleep will consistently improve the longer he's on fluoxetine and as we get him on approp dose."  Interim hx: Feeling significantly improved.  Has not had near as much irritability/anger. Went on a road trip to [place] recently and all went well, wife commented that she was pleased with the changes.  He "failed" when doing a wood working project recently and did not let it anger him like it would have in the past.   No depressed mood. Mild HA at beginning of taking inc dose of fluox but this went away. He DOES note onset of ED lately since inc in fluox dose. He is ok with waiting to see if this gradually improves.  No new complaints.  Past Medical History:  Diagnosis Date  . BPH (benign prostatic hypertrophy)   . Cervical spondylosis    with radiculopathy not responsive to conservative therapies:  [facility]: ACDF surgery C5-C7 performed 04/05/15  . Chronic pain syndrome    cervicalgia  . Difficulty controlling anger    + GAD with OCD tendencies.  Marland Kitchen Dysplastic nevus 10/2015   left mid back: Dr. Denna Haggard following  . Glaucoma   . History of adenomatous polyp of colon   . IBS (irritable bowel syndrome)    +constipation  . Lateral epicondylitis of left elbow   . Neuropathic pain   . Peripheral neuropathy    Idiopathic, inherited per pt's neurologist, Dr. Everette Rank. Various labs normal 07/2016 except vitamin B6 was siginificantly elevated.  Allergist eval/allergy testing (including food) ALL NORMAL/NEG 07/2016.  Marland Kitchen  Restless legs syndrome 09/27/2014  . Throat irritation    chronic (since cervical spine surgery 2016).  Dr. Erik Obey Q000111Q normal, CT sinuses normal, barium swallow normal, trial of protonix no help.     Past Surgical History:  Procedure Laterality Date  . Ant cerv decompression/discectomy w/fusion C 5-7  04/05/15   C5-7 ACDF  . CATARACT EXTRACTION Right    2010  . COLONOSCOPY W/ POLYPECTOMY  08/2012; 05/23/16   2017, adenomatous polyp--recall 5 yrs.  Marland Kitchen EYE SURGERY Right   . TRABECULECTOMY  2009   for uncontrolled glaucoma R eye  . TRANSURETHRAL RESECTION OF PROSTATE  2008   due to enlarged prostate  . VASECTOMY  2001    Outpatient Medications Prior to Visit  Medication Sig Dispense Refill  . Alpha-Lipoic Acid 300 MG CAPS Take 1 capsule by mouth 2 (two) times daily.    . clonazePAM (KLONOPIN) 0.5 MG tablet 1-2 tabs po bid prn IF YOU HAVE INCREASED ANXIETY 30 tablet 1  . gabapentin (NEURONTIN) 300 MG capsule Take 1 capsules bid (Keith Mckee taking differently: Take 900 mg by mouth 2 (two) times daily. ) 120 capsule 0  . HYDROcodone-acetaminophen (NORCO/VICODIN) 5-325 MG tablet Take 1-2 tablets by mouth every 6 (six) hours as needed for moderate pain. 30 tablet 0  . Magnesium 300 MG CAPS Take by mouth daily.    . Omega-3 Fatty Acids (OMEGA-3 FISH OIL) 300 MG CAPS Take 1  capsule by mouth daily.    Marland Kitchen FLUoxetine (PROZAC) 40 MG capsule Take 1 capsule (40 mg total) by mouth daily. 30 capsule 0   No facility-administered medications prior to visit.     No Known Allergies  ROS As per HPI  PE: Blood pressure 105/71, pulse (!) 56, temperature 98 F (36.7 C), temperature source Temporal, resp. rate 16, height 6\' 1"  (1.854 m), weight 165 lb 6 oz (75 kg), SpO2 98 %. Body mass index is 21.82 kg/m.  Gen: Alert, well appearing.  Keith Mckee is oriented to person, place, time, and situation. AFFECT: pleasant, lucid thought and speech. No further exam today.  LABS:    Chemistry       Component Value Date/Time   NA 140 07/31/2018 1626   K 4.7 07/31/2018 1626   CL 103 07/31/2018 1626   CO2 29 07/31/2018 1626   BUN 22 07/31/2018 1626   CREATININE 1.23 07/31/2018 1626      Component Value Date/Time   CALCIUM 10.0 07/31/2018 1626   ALKPHOS 64 12/12/2016 0846   AST 23 07/31/2018 1626   ALT 21 07/31/2018 1626   BILITOT 0.6 07/31/2018 1626      IMPRESSION AND PLAN:  GAD, with anger control/irritability issues chronically: he is much improved since getting on fluoxetine and titrating to 40 mg qd dosing. Has not had to use benzo ANY in the last 6 wks. He has ED side effect from fluox but he is ok with watchful waiting of this problem. Discussed the fact that wellbutrin is sometimes used for this situation (adding to fluox) but given his anxiety /irritability/anger issues it would not likely be a good med choice for him. He expressed understanding and agreement.  If continues to do well on the fluox and ED persists then we can discuss prn use of sildenafil.  An After Visit Summary was printed and given to the Keith Mckee.  FOLLOW UP: Return in about 4 months (around 08/27/2019) for annual CPE (fasting). 3-4 mo CPE  Signed:  Crissie Sickles, MD           04/29/2019

## 2019-05-10 ENCOUNTER — Encounter: Payer: Self-pay | Admitting: Family Medicine

## 2019-05-12 NOTE — Telephone Encounter (Signed)
Patient only wants to see Dr. Anitra Lauth. Please advise.

## 2019-05-12 NOTE — Telephone Encounter (Signed)
Patient called to make an appointment. First opening is 05/19/19. I offered it to the patient. He declined, I also offered an appointment in the [facility] office. Patient said he will go to a different practice and hung up.

## 2019-05-26 ENCOUNTER — Other Ambulatory Visit: Payer: 59

## 2019-05-26 ENCOUNTER — Other Ambulatory Visit: Payer: Self-pay

## 2019-05-26 DIAGNOSIS — R197 Diarrhea, unspecified: Secondary | ICD-10-CM

## 2019-05-31 LAB — GASTROINTESTINAL PATHOGEN PANEL PCR
C. difficile Tox A/B, PCR: NOT DETECTED
Campylobacter, PCR: NOT DETECTED
Cryptosporidium, PCR: NOT DETECTED
E coli (ETEC) LT/ST PCR: NOT DETECTED
E coli (STEC) stx1/stx2, PCR: NOT DETECTED
E coli 0157, PCR: NOT DETECTED
Giardia lamblia, PCR: NOT DETECTED
Norovirus, PCR: NOT DETECTED
Rotavirus A, PCR: NOT DETECTED
Salmonella, PCR: NOT DETECTED
Shigella, PCR: NOT DETECTED

## 2019-06-07 ENCOUNTER — Ambulatory Visit (INDEPENDENT_AMBULATORY_CARE_PROVIDER_SITE_OTHER): Payer: 59 | Admitting: Gastroenterology

## 2019-06-07 ENCOUNTER — Encounter: Payer: Self-pay | Admitting: Gastroenterology

## 2019-06-07 VITALS — BP 100/60 | HR 60 | Temp 97.8°F | Ht 73.0 in | Wt 168.4 lb

## 2019-06-07 DIAGNOSIS — R109 Unspecified abdominal pain: Secondary | ICD-10-CM

## 2019-06-07 DIAGNOSIS — R197 Diarrhea, unspecified: Secondary | ICD-10-CM | POA: Diagnosis not present

## 2019-06-07 DIAGNOSIS — Z1159 Encounter for screening for other viral diseases: Secondary | ICD-10-CM | POA: Diagnosis not present

## 2019-06-07 MED ORDER — SUPREP BOWEL PREP KIT 17.5-3.13-1.6 GM/177ML PO SOLN: 1.0000 | Freq: Once | ORAL | 0 refills | Status: AC

## 2019-06-07 NOTE — Progress Notes (Signed)
Keith Mckee    VO:[id number]    09-21-1960  Primary Care Physician:McGowen, Adrian Blackwater, MD  Referring Physician: Tammi Sou, MD [address] [place],  [place] [address]   Chief complaint: Diarrhea HPI: 58 year old male here with c/o diarrhea for past 6 weeks. Last office visit 09/2018. He was having intermittent constipation prior to having diarrhea. He feels like a switch was turned on and his bowels changed completely.  GI pathogen panel negative for infectious etiology.   He also got Covid test , was negative  He takes Ibuprofen intermittent, denies using it often or on regular basis.  Stopped taking magnesium after diarrhea started  Started taking Prozac 2 months ago, stopped Prozac after taking for 2 weeks  He feels diarrhea is slightly better in the past 2-3 weeks, no longer having nocturnal symptoms, is having 2-3 BM per day, he was having 4-6 loose watery bowel movements before associated with abdominal cramping.  He continues to have intermittent abdominal cramping but feels symptoms overall are improving slowly. Denies any other new medications.  No blood in stool or black stool.  No weight loss.  No vomiting.  Colonoscopy 05/23/2016: 2 mm polyp (tubular adenoma) from ascending colon, 6 mm polyp (hyperplastic ) removed from rectum and internal hemorrhoids  Outpatient Encounter Medications as of 06/07/2019  Medication Sig  . Alpha-Lipoic Acid 300 MG CAPS Take 1 capsule by mouth 2 (two) times daily.  . AMBULATORY NON FORMULARY MEDICATION Medication Name: CBD Oil- Take 15mg  qhs  . clonazePAM (KLONOPIN) 0.5 MG tablet 1-2 tabs po bid prn IF YOU HAVE INCREASED ANXIETY  . gabapentin (NEURONTIN) 300 MG capsule Take 900 mg by mouth 2 (two) times daily.  Marland Kitchen HYDROcodone-acetaminophen (NORCO/VICODIN) 5-325 MG tablet Take 1-2 tablets by mouth every 6 (six) hours as needed for moderate pain.  . Magnesium 300 MG CAPS Take by mouth daily.  . Omega-3 Fatty  Acids (OMEGA-3 FISH OIL) 300 MG CAPS Take 1 capsule by mouth daily.  . [DISCONTINUED] FLUoxetine (PROZAC) 40 MG capsule Take 1 capsule (40 mg total) by mouth daily.  . [DISCONTINUED] gabapentin (NEURONTIN) 300 MG capsule Take 1 capsules bid (Patient taking differently: Take 900 mg by mouth 2 (two) times daily. )   No facility-administered encounter medications on file as of 06/07/2019.    Allergies as of 06/07/2019  . (No Known Allergies)    Past Medical History:  Diagnosis Date  . BPH (benign prostatic hypertrophy)   . Cervical spondylosis    with radiculopathy not responsive to conservative therapies:  [facility]: ACDF surgery C5-C7 performed 04/05/15  . Chronic pain syndrome    cervicalgia  . Difficulty controlling anger    + GAD with OCD tendencies.  Marland Kitchen Dysplastic nevus 10/2015   left mid back: Dr. Denna Haggard following  . Glaucoma   . History of adenomatous polyp of colon   . IBS (irritable bowel syndrome)    +constipation  . Lateral epicondylitis of left elbow   . Neuropathic pain   . Peripheral neuropathy    Idiopathic, inherited per pt's neurologist, Dr. Everette Rank. Various labs normal 07/2016 except vitamin B6 was siginificantly elevated.  Allergist eval/allergy testing (including food) ALL NORMAL/NEG 07/2016.  Marland Kitchen Restless legs syndrome 09/27/2014  . Throat irritation    chronic (since cervical spine surgery 2016).  Dr. Erik Obey Q000111Q normal, CT sinuses normal, barium swallow normal, trial of protonix no help.  Past Surgical History:  Procedure Laterality Date  . Ant cerv decompression/discectomy w/fusion C 5-7  04/05/15   C5-7 ACDF  . CATARACT EXTRACTION Bilateral   . COLONOSCOPY W/ POLYPECTOMY  08/2012; 05/23/16   2017, adenomatous polyp--recall 5 yrs.  . TRABECULECTOMY Bilateral    for uncontrolled glaucoma   . TRANSURETHRAL RESECTION OF PROSTATE  2008   due to enlarged prostate  . VASECTOMY  2001    Family History  Problem Relation  Age of Onset  . Glaucoma Father   . Heart disease Father   . Neuropathy Father        Peripheral neuropathy  . Arthritis Father   . Parkinson's disease Father   . Colon cancer Neg Hx   . Rectal cancer Neg Hx     Social History   Socioeconomic History  . Marital status: Married    Spouse name: Not on file  . Number of children: 2  . Years of education: Master D.  . Highest education level: Not on file  Occupational History  . Occupation: Immunologist: Building surveyor FOR SELF EMPLOYED    Comment: [employer]  . Occupation: handyman  Tobacco Use  . Smoking status: Former Smoker    Packs/day: 0.75    Years: 15.00    Pack years: 11.25    Types: Cigarettes    Quit date: 06/24/1998    Years since quitting: 20.9  . Smokeless tobacco: Never Used  . Tobacco comment: 2 pack per week  Substance and Sexual Activity  . Alcohol use: Yes    Alcohol/week: 12.0 standard drinks    Types: 12 Cans of beer per week    Comment: Patient drinks 12 beers a week  . Drug use: No  . Sexual activity: Not on file  Other Topics Concern  . Not on file  Social History Narrative   Married.   Educ: MBA   Occupation: Real Product manager   No tob, occ alc, no drugs.   Exercises regularly.   Patient is right handed.   Patient drinks 1 large cup of coffee daily.   Social Determinants of Health   Financial Resource Strain:   . Difficulty of Paying Living Expenses: Not on file  Food Insecurity:   . Worried About Charity fundraiser in the Last Year: Not on file  . Ran Out of Food in the Last Year: Not on file  Transportation Needs:   . Lack of Transportation (Medical): Not on file  . Lack of Transportation (Non-Medical): Not on file  Physical Activity:   . Days of Exercise per Week: Not on file  . Minutes of Exercise per Session: Not on file  Stress:   . Feeling of Stress : Not on file  Social Connections:   . Frequency of Communication with Friends and Family:  Not on file  . Frequency of Social Gatherings with Friends and Family: Not on file  . Attends Religious Services: Not on file  . Active Member of Clubs or Organizations: Not on file  . Attends Archivist Meetings: Not on file  . Marital Status: Not on file  Intimate Partner Violence:   . Fear of Current or Ex-Partner: Not on file  . Emotionally Abused: Not on file  . Physically Abused: Not on file  . Sexually Abused: Not on file      Review of systems: Review of Systems  Constitutional: Negative for fever and chills.  HENT: Negative.  Eyes: Negative for blurred vision.  Respiratory: Negative for cough, shortness of breath and wheezing.   Cardiovascular: Negative for chest pain and palpitations.  Gastrointestinal: as per HPI Genitourinary: Negative for dysuria, urgency, frequency and hematuria.  Musculoskeletal: Negative for myalgias, back pain and joint pain.  Skin: Negative for itching and rash.  Neurological: Negative for dizziness, tremors, focal weakness, seizures and loss of consciousness.  Endo/Heme/Allergies: Positive for seasonal allergies.  Psychiatric/Behavioral: Negative for depression, suicidal ideas and hallucinations.  All other systems reviewed and are negative.   Physical Exam: Vitals:   06/07/19 1459  BP: 100/60  Pulse: 60  Temp: 97.8 F (36.6 C)   Body mass index is 22.22 kg/m. Gen:      No acute distress HEENT:  EOMI, sclera anicteric Neck:     No masses; no thyromegaly Lungs:    Clear to auscultation bilaterally; normal respiratory effort CV:         Regular rate and rhythm; no murmurs Abd:      + bowel sounds; soft, non-tender; no palpable masses, no distension Ext:    No edema; adequate peripheral perfusion Skin:      Warm and dry; no rash Neuro: alert and oriented x 3 Psych: normal mood and affect  Data Reviewed:  Reviewed labs, radiology imaging, old records and pertinent past GI work up   Assessment and  Plan/Recommendations:  58 year old male with history of chronic constipation now with diarrhea for the past 6 weeks. His symptoms started after he started taking Prozac, diarrhea is slightly better in the past 2 to 3 weeks Possible side effect of Prozac. Will schedule for colonoscopy with biopsies to exclude microscopic colitis if continues to have persistent symptoms of diarrhea but if improves, will cancel the procedure. Continue Benefiber 1 tablespoon twice daily The risks and benefits as well as alternatives of endoscopic procedure(s) have been discussed and reviewed. All questions answered. The patient agrees to proceed.  25 minutes was spent face-to-face with the patient. Greater than 50% of the time used for counseling  as well as treatment plan and follow-up. He had multiple questions which were answered to his satisfaction  K. Denzil Magnuson , MD    CC: McGowen, Adrian Blackwater, MD

## 2019-06-07 NOTE — Patient Instructions (Signed)
You have been scheduled for a colonoscopy. Please follow written instructions given to you at your visit today.  Please pick up your prep supplies at the pharmacy within the next 1-3 days. If you use inhalers (even only as needed), please bring them with you on the day of your procedure.   If you are age 56 or older, your body mass index should be between 23-30. Your Body mass index is 22.22 kg/m. If this is out of the aforementioned range listed, please consider follow up with your Primary Care Provider.  If you are age 67 or younger, your body mass index should be between 19-25. Your Body mass index is 22.22 kg/m. If this is out of the aformentioned range listed, please consider follow up with your Primary Care Provider.    Take Benefiber 1 tablespoon twice daily  I appreciate the  opportunity to care for you  Thank You   Harl Bowie , MD

## 2019-06-13 ENCOUNTER — Encounter: Payer: Self-pay | Admitting: Gastroenterology

## 2019-06-22 ENCOUNTER — Encounter: Payer: Self-pay | Admitting: Gastroenterology

## 2019-06-23 ENCOUNTER — Other Ambulatory Visit: Payer: Self-pay | Admitting: Gastroenterology

## 2019-06-23 ENCOUNTER — Ambulatory Visit (INDEPENDENT_AMBULATORY_CARE_PROVIDER_SITE_OTHER): Payer: Managed Care, Other (non HMO)

## 2019-06-23 ENCOUNTER — Encounter: Payer: Self-pay | Admitting: *Deleted

## 2019-06-23 DIAGNOSIS — Z1159 Encounter for screening for other viral diseases: Secondary | ICD-10-CM

## 2019-06-24 LAB — SARS CORONAVIRUS 2 (TAT 6-24 HRS): SARS Coronavirus 2: NEGATIVE

## 2019-06-25 DIAGNOSIS — K52839 Microscopic colitis, unspecified: Secondary | ICD-10-CM

## 2019-06-25 HISTORY — DX: Microscopic colitis, unspecified: K52.839

## 2019-06-29 ENCOUNTER — Ambulatory Visit (AMBULATORY_SURGERY_CENTER): Payer: Managed Care, Other (non HMO) | Admitting: Gastroenterology

## 2019-06-29 ENCOUNTER — Encounter: Payer: Self-pay | Admitting: Gastroenterology

## 2019-06-29 ENCOUNTER — Other Ambulatory Visit: Payer: Self-pay

## 2019-06-29 VITALS — BP 97/65 | HR 55 | Temp 97.6°F | Resp 12 | Ht 73.0 in | Wt 168.0 lb

## 2019-06-29 DIAGNOSIS — R197 Diarrhea, unspecified: Secondary | ICD-10-CM

## 2019-06-29 DIAGNOSIS — K52832 Lymphocytic colitis: Secondary | ICD-10-CM

## 2019-06-29 HISTORY — PX: COLONOSCOPY: SHX174

## 2019-06-29 MED ORDER — SODIUM CHLORIDE 0.9 % IV SOLN: 500.0000 mL | INTRAVENOUS | Status: DC

## 2019-06-29 NOTE — Progress Notes (Signed)
Pt tolerated well. VSS. Awake and to recovery. 

## 2019-06-29 NOTE — Patient Instructions (Signed)
You will need a repeat colonoscopy in 10 years.  Please, read all of the handouts given to you by your recovery room nurse.  YOU HAD AN ENDOSCOPIC PROCEDURE TODAY AT [facility]:   Refer to the procedure report that was given to you for any specific questions about what was found during the examination.  If the procedure report does not answer your questions, please call your gastroenterologist to clarify.  If you requested that your care partner not be given the details of your procedure findings, then the procedure report has been included in a sealed envelope for you to review at your convenience later.  YOU SHOULD EXPECT: Some feelings of bloating in the abdomen. Passage of more gas than usual.  Walking can help get rid of the air that was put into your GI tract during the procedure and reduce the bloating. If you had a lower endoscopy (such as a colonoscopy or flexible sigmoidoscopy) you may notice spotting of blood in your stool or on the toilet paper. If you underwent a bowel prep for your procedure, you may not have a normal bowel movement for a few days.  Please Note:  You might notice some irritation and congestion in your nose or some drainage.  This is from the oxygen used during your procedure.  There is no need for concern and it should clear up in a day or so.  SYMPTOMS TO REPORT IMMEDIATELY:   Following lower endoscopy (colonoscopy or flexible sigmoidoscopy):  Excessive amounts of blood in the stool  Significant tenderness or worsening of abdominal pains  Swelling of the abdomen that is new, acute  Fever of 100F or higher   For urgent or emergent issues, a gastroenterologist can be reached at any hour by calling [phone].  DIET:  We do recommend a small meal at first, but then you may proceed to your regular diet.  Drink plenty of fluids but you should avoid alcoholic beverages for 24 hours.  ACTIVITY:  You should plan to take it easy for the rest of  today and you should NOT DRIVE or use heavy machinery until tomorrow (because of the sedation medicines used during the test).    FOLLOW UP: Our staff will call the number listed on your records 48-72 hours following your procedure to check on you and address any questions or concerns that you may have regarding the information given to you following your procedure. If we do not reach you, we will leave a message.  We will attempt to reach you two times.  During this call, we will ask if you have developed any symptoms of COVID 19. If you develop any symptoms (ie: fever, flu-like symptoms, shortness of breath, cough etc.) before then, please call [phone].  If you test positive for Covid 19 in the 2 weeks post procedure, please call and report this information to [place].    If any biopsies were taken you will be contacted by phone or by letter within the next 1-3 weeks.  Please call us at [phone] if you have not heard about the biopsies in 3 weeks.    SIGNATURES/CONFIDENTIALITY: You and/or your care partner have signed paperwork which will be entered into your electronic medical record.  These signatures attest to the fact that that the information above on your After Visit Summary has been reviewed and is understood.  Full responsibility of the confidentiality of this discharge information lies with you and/or your care-partner.

## 2019-06-29 NOTE — Progress Notes (Signed)
Temp JB  vs CW 

## 2019-06-29 NOTE — Op Note (Signed)
[facility] Patient Name: Keith Mckee Procedure Date: 06/29/2019 9:29 AM MRN: [id number] Endoscopist: Mauri Pole , MD Age: 58 Referring MD:  Date of Birth: March 03, 1961 Gender: Male Account #: [id number] Procedure:                Colonoscopy Indications:              Clinically significant diarrhea of unexplained                            origin, Change in bowel habits. Last colonoscopy in                            2017 with removal of tubular adenoma <1cm Medicines:                Propofol per Anesthesia, Monitored Anesthesia Care Procedure:                Pre-Anesthesia Assessment:                           - Prior to the procedure, a History and Physical                            was performed, and patient medications and                            allergies were reviewed. The patient's tolerance of                            previous anesthesia was also reviewed. The risks                            and benefits of the procedure and the sedation                            options and risks were discussed with the patient.                            All questions were answered, and informed consent                            was obtained. Prior Anticoagulants: The patient has                            taken no previous anticoagulant or antiplatelet                            agents. ASA Grade Assessment: II - A patient with                            mild systemic disease. After reviewing the risks                            and benefits, the patient was deemed in  satisfactory condition to undergo the procedure.                           After obtaining informed consent, the colonoscope                            was passed under direct vision. Throughout the                            procedure, the patient's blood pressure, pulse, and                            oxygen saturations were monitored continuously. The            Colonoscope was introduced through the anus and                            advanced to the the cecum, identified by                            appendiceal orifice and ileocecal valve. The                            colonoscopy was performed without difficulty. The                            patient tolerated the procedure well. The quality                            of the bowel preparation was excellent. The                            ileocecal valve, appendiceal orifice, and rectum                            were photographed. Scope In: 9:37:19 AM Scope Out: 9:58:05 AM Scope Withdrawal Time: 0 hours 11 minutes 13 seconds  Total Procedure Duration: 0 hours 20 minutes 46 seconds  Findings:                 The perianal and digital rectal examinations were                            normal.                           Normal mucosa was found in the entire colon.                            Biopsies for histology were taken with a cold                            forceps from the right colon and left colon for                            evaluation  of microscopic colitis.                           The exam was otherwise without abnormality. Complications:            No immediate complications. Estimated Blood Loss:     Estimated blood loss was minimal. Impression:               - Normal mucosa in the entire examined colon.                            Biopsied.                           - The examination was otherwise normal. Recommendation:           - Patient has a contact number available for                            emergencies. The signs and symptoms of potential                            delayed complications were discussed with the                            patient. Return to normal activities tomorrow.                            Written discharge instructions were provided to the                            patient.                           - Resume previous diet.                            - Continue present medications.                           - Await pathology results.                           - Repeat colonoscopy in 10 years for surveillance                            based on pathology results. Mauri Pole, MD 06/29/2019 10:03:25 AM This report has been signed electronically.

## 2019-06-29 NOTE — Progress Notes (Signed)
Called to room to assist during endoscopic procedure.  Patient ID and intended procedure confirmed with present staff. Received instructions for my participation in the procedure from the performing physician.  

## 2019-06-30 ENCOUNTER — Other Ambulatory Visit: Payer: Self-pay

## 2019-06-30 MED ORDER — COLESTIPOL HCL 1 G PO TABS: 1.0000 g | ORAL_TABLET | Freq: Two times a day (BID) | ORAL | 0 refills | Status: DC

## 2019-07-01 ENCOUNTER — Telehealth: Payer: Self-pay | Admitting: *Deleted

## 2019-07-01 NOTE — Telephone Encounter (Signed)
Attempted f/u phone call. No answer. Left message. °

## 2019-07-01 NOTE — Telephone Encounter (Signed)
Attempted 2nd f/u phone call. No answer. Left message.  °

## 2019-07-06 ENCOUNTER — Other Ambulatory Visit: Payer: Self-pay

## 2019-07-06 ENCOUNTER — Encounter: Payer: Self-pay | Admitting: Family Medicine

## 2019-07-06 MED ORDER — BUDESONIDE 3 MG PO CPEP: 9.0000 mg | ORAL_CAPSULE | Freq: Every day | ORAL | 0 refills | Status: DC

## 2019-07-09 ENCOUNTER — Telehealth: Payer: Self-pay

## 2019-07-09 NOTE — Telephone Encounter (Signed)
Spoke with the patient.  He is receiving the budesonide from [facility]. He had an auto-pay set up and they had his credit card information. [facility] shipped an charged his card for the prescription. He did call them. There was documentation of his call as well as my call yesterday. Therefore they are discounting the prescription but are not refunding the full amount. He states he will be taking the Uceris as prescribed.

## 2019-07-23 ENCOUNTER — Encounter: Payer: Self-pay | Admitting: Family Medicine

## 2019-07-27 ENCOUNTER — Other Ambulatory Visit: Payer: Self-pay

## 2019-07-28 ENCOUNTER — Ambulatory Visit (INDEPENDENT_AMBULATORY_CARE_PROVIDER_SITE_OTHER): Payer: Managed Care, Other (non HMO) | Admitting: Family Medicine

## 2019-07-28 ENCOUNTER — Other Ambulatory Visit: Payer: Self-pay

## 2019-07-28 ENCOUNTER — Encounter: Payer: Self-pay | Admitting: Family Medicine

## 2019-07-28 VITALS — BP 100/60 | HR 54 | Temp 98.3°F | Resp 16 | Ht 73.0 in | Wt 169.6 lb

## 2019-07-28 DIAGNOSIS — G609 Hereditary and idiopathic neuropathy, unspecified: Secondary | ICD-10-CM

## 2019-07-28 DIAGNOSIS — R454 Irritability and anger: Secondary | ICD-10-CM

## 2019-07-28 MED ORDER — BUPROPION HCL ER (XL) 300 MG PO TB24: 300.0000 mg | ORAL_TABLET | Freq: Every day | ORAL | 1 refills | Status: DC

## 2019-07-28 NOTE — Progress Notes (Signed)
OFFICE VISIT  07/28/2019   CC:  Chief Complaint  Patient presents with  . Discuss wellbutrin med    recently started within the last month, possible dose increase?    HPI:    Patient is a 59 y.o. Caucasian male with hx of anx and mood d/o who presents for discussion of wellbutrin. He was recently followed up by his neurologist for his periph neuropathy and had noted that while he was on prozac his PN pain was actually improved, as did his anxiety/anger levels.  However, it caused ED (plus he was fearful it was the cause of his lymphocytic colitis) so he stopped it.   Neuro then rx'd him wellbutrin.  His ED resolved after getting off prozac. Says anger/irritability still a significant problem but not any worse since getting on the wellbutrin. He recalls that it took about 6 wks + one dose increase of the prozac to begin to help from a psych standpoint as well as helping his neuropathy pain.  He is in favor of giving higher dose of wellbutrin and reassessing in a few weeks or so. Denies depression, says anxiety/worry level is fine/stable----just the anger/irritability is the issue, as per his usual complaint.   Past Medical History:  Diagnosis Date  . Anxiety   . BPH (benign prostatic hypertrophy)   . Cataract    artifiicial lenses in both eyes  . Cervical spondylosis    with radiculopathy not responsive to conservative therapies:  [facility]: ACDF surgery C5-C7 performed 04/05/15  . Chronic pain syndrome    cervicalgia  . Difficulty controlling anger    + GAD with OCD tendencies.  Marland Kitchen Dysplastic nevus 10/2015   left mid back: Dr. Denna Haggard following  . Glaucoma   . History of adenomatous polyp of colon   . IBS (irritable bowel syndrome)    +constipation  . Lateral epicondylitis of left elbow   . Lymphocytic colitis 05/2019, 2021   Colonoscopy bx +  . Microscopic colitis 06/2019   colonoscopy + 06/2019  . Neuropathic pain   . Peripheral neuropathy    Idiopathic, inherited per pt's neurologist, Dr. Everette Rank. Various labs normal 07/2016 except vitamin B6 was siginificantly elevated.  Allergist eval/allergy testing (including food) ALL NORMAL/NEG 07/2016.  Marland Kitchen Restless legs syndrome 09/27/2014  . Throat irritation    chronic (since cervical spine surgery 2016).  Dr. Erik Obey [id number] normal, CT sinuses normal, barium swallow normal, trial of protonix no help.     Past Surgical History:  Procedure Laterality Date  . Ant cerv decompression/discectomy w/fusion C 5-7  04/05/15   C5-7 ACDF  . CATARACT EXTRACTION Bilateral   . COLONOSCOPY  06/29/2019   FOR EVAL OF UNEXPLAINED DIARRHEA-->normal, bx c/w microscopic colitis->uceris and budesonide rx'd by Dr. Silverio Decamp 06/2019  . COLONOSCOPY W/ POLYPECTOMY  08/2012; 05/23/16   2017, adenomatous polyp--recall 5 yrs.  . TRABECULECTOMY Bilateral    for uncontrolled glaucoma   . TRANSURETHRAL RESECTION OF PROSTATE  2008   due to enlarged prostate  . VASECTOMY  2001   Outpatient Medications Prior to Visit  Medication Sig Dispense Refill  . Alpha-Lipoic Acid 300 MG CAPS Take 1 capsule by mouth 2 (two) times daily.    . AMBULATORY NON FORMULARY MEDICATION Medication Name: CBD Oil- Take 15mg  qhs    . budesonide (ENTOCORT EC) 3 MG 24 hr capsule Take 3 capsules (9 mg total) by mouth daily. 90 capsule 0  . gabapentin (NEURONTIN) 300 MG capsule Take 900 mg by mouth  2 (two) times daily.    Marland Kitchen HYDROcodone-acetaminophen (NORCO/VICODIN) 5-325 MG tablet Take 1-2 tablets by mouth every 6 (six) hours as needed for moderate pain. 30 tablet 0  . Magnesium 300 MG CAPS Take by mouth daily.    . Omega-3 Fatty Acids (OMEGA-3 FISH OIL) 300 MG CAPS Take 1 capsule by mouth daily.    . clonazePAM (KLONOPIN) 0.5 MG tablet 1-2 tabs po bid prn IF YOU HAVE INCREASED ANXIETY (Patient not taking: Reported on 07/28/2019) 30 tablet 1  . colestipol (COLESTID) 1 g tablet Take 1 tablet (1 g total) by mouth 2 (two) times daily. Take it 3  hours apart from your other medication (Patient not taking: Reported on 07/28/2019) 60 tablet 0  . buPROPion (WELLBUTRIN XL) 150 MG 24 hr tablet Take 150 mg by mouth daily.    . psyllium (METAMUCIL) 58.6 % packet Take 3.4 packets by mouth 2 (two) times daily.     No facility-administered medications prior to visit.    No Known Allergies  ROS As per HPI  PE: bp recheck manually at end of visit was 100/60 Blood pressure 100/60, pulse (!) 54, temperature 98.3 F (36.8 C), temperature source Temporal, resp. rate 16, height 6\' 1"  (1.854 m), weight 169 lb 9.6 oz (76.9 kg), SpO2 98 %. Wt Readings from Last 2 Encounters:  07/28/19 169 lb 9.6 oz (76.9 kg)  06/29/19 168 lb (76.2 kg)    Gen: alert, oriented x 4, affect pleasant.  Lucid thinking and conversation noted. HEENT: PERRLA, EOMI.   Neck: no LAD, mass, or thyromegaly. CV: RRR, no m/r/g LUNGS: CTA bilat, nonlabored. NEURO: no tremor or tics noted on observation.  Coordination intact. CN 2-12 grossly intact bilaterally, strength 5/5 in all extremeties.  No ataxia.   LABS:    Chemistry      Component Value Date/Time   NA 140 07/31/2018 1626   K 4.7 07/31/2018 1626   CL 103 07/31/2018 1626   CO2 29 07/31/2018 1626   BUN 22 07/31/2018 1626   CREATININE 1.23 07/31/2018 1626      Component Value Date/Time   CALCIUM 10.0 07/31/2018 1626   ALKPHOS 64 12/12/2016 0846   AST 23 07/31/2018 1626   ALT 21 07/31/2018 1626   BILITOT 0.6 07/31/2018 1626     Lab Results  Component Value Date   WBC 6.1 07/31/2018   HGB 16.9 07/31/2018   HCT 49.3 07/31/2018   MCV 89.8 07/31/2018   PLT 184 07/31/2018    IMPRESSION AND PLAN:  1) Anger/irritability problems, not too much anxiety, no depressed mood. Will increase the wellbutrin xl to 300mg  qd and give this more time. He is aware of potential side effects of this med. Has clonaz on hand to use prn but is not using it b/c he essentially forgets that he has it.  2) Peripheral  neuropathy: continue gabapentin and prn hydrocodone/apap. Hopefully he'll see some improvement from the wellbutrin, similar to what the prozac did for him.  3) Microscopic colitis: his diarrhea is resolved. Fluoxetine not likely the cause of this but pt's apprehension regarding the fluoxetine is understandable and we'll avoid this med in future.  An After Visit Summary was printed and given to the patient.  FOLLOW UP: Return for keep f/u scheduled for March. CPE  Signed:  Crissie Sickles, MD           07/28/2019

## 2019-08-19 ENCOUNTER — Ambulatory Visit (INDEPENDENT_AMBULATORY_CARE_PROVIDER_SITE_OTHER): Payer: 59 | Admitting: Gastroenterology

## 2019-08-19 ENCOUNTER — Encounter: Payer: Self-pay | Admitting: Gastroenterology

## 2019-08-19 VITALS — BP 110/68 | HR 68 | Temp 98.2°F | Ht 73.0 in | Wt 170.4 lb

## 2019-08-19 DIAGNOSIS — K52832 Lymphocytic colitis: Secondary | ICD-10-CM

## 2019-08-19 NOTE — Progress Notes (Signed)
Keith Mckee    [id number]    [birth year]  Primary Care Physician:McGowen, Adrian Blackwater, MD  Referring Physician: Tammi Sou, MD [address] [place],  [place] [address]   Chief complaint: lymphocytic colitis HPI: 59 year old male here for follow-up visit for lymphocytic colitis  Colonoscopy June 29, 2019: Biopsies positive for lymphocytic colitis otherwise unremarkable exam  Completed 30-day course of budesonide 9 mg daily.  He is currently having formed bowel movement every other day, bowel habits are back to baseline.  Diarrhea started after he started taking Prozac, possible etiology for lymphocytic colitis.  He also takes NSAIDs intermittently.  Denies any nausea, vomiting, abdominal pain, melena or bright red blood per rectum  Colonoscopy 05/23/2016:2 mm polyp (tubular adenoma)from ascending colon, 6 mm polyp (hyperplastic)removed from rectum and internal hemorrhoids  Outpatient Encounter Medications as of 08/19/2019  Medication Sig  . Alpha-Lipoic Acid 300 MG CAPS Take 1 capsule by mouth 2 (two) times daily.  . AMBULATORY NON FORMULARY MEDICATION Medication Name: CBD Oil- Take 15mg  qhs  . buPROPion (WELLBUTRIN XL) 300 MG 24 hr tablet Take 1 tablet (300 mg total) by mouth daily.  . clonazePAM (KLONOPIN) 0.5 MG tablet 1-2 tabs po bid prn IF YOU HAVE INCREASED ANXIETY  . gabapentin (NEURONTIN) 300 MG capsule Take 900 mg by mouth 2 (two) times daily.  Marland Kitchen HYDROcodone-acetaminophen (NORCO/VICODIN) 5-325 MG tablet Take 1-2 tablets by mouth every 6 (six) hours as needed for moderate pain.  . Magnesium 300 MG CAPS Take by mouth daily.  . Omega-3 Fatty Acids (OMEGA-3 FISH OIL) 300 MG CAPS Take 1 capsule by mouth daily.  . [DISCONTINUED] budesonide (ENTOCORT EC) 3 MG 24 hr capsule Take 3 capsules (9 mg total) by mouth daily.  . [DISCONTINUED] colestipol (COLESTID) 1 g tablet Take 1 tablet (1 g total) by mouth 2 (two) times daily. Take it 3 hours apart from  your other medication (Patient not taking: Reported on 07/28/2019)   No facility-administered encounter medications on file as of 08/19/2019.    Allergies as of 08/19/2019  . (No Known Allergies)    Past Medical History:  Diagnosis Date  . Anxiety   . BPH (benign prostatic hypertrophy)   . Cataract    artifiicial lenses in both eyes  . Cervical spondylosis    with radiculopathy not responsive to conservative therapies:  [facility] 02/2015: ACDF surgery C5-C7 performed 04/05/15  . Chronic pain syndrome    cervicalgia  . Difficulty controlling anger    + GAD with OCD tendencies.  Marland Kitchen Dysplastic nevus 10/2015   left mid back: Dr. Denna Haggard following  . Glaucoma   . History of adenomatous polyp of colon   . IBS (irritable bowel syndrome)    +constipation  . Lateral epicondylitis of left elbow   . Lymphocytic colitis 05/2019, 2021   Colonoscopy bx +  . Microscopic colitis 06/2019   colonoscopy + 06/2019  . Neuropathic pain   . Peripheral neuropathy    Idiopathic, inherited per pt's neurologist, Dr. Everette Rank. Various labs normal 07/2016 except vitamin B6 was siginificantly elevated.  Allergist eval/allergy testing (including food) ALL NORMAL/NEG 07/2016.  Marland Kitchen Restless legs syndrome 09/27/2014  . Throat irritation    chronic (since cervical spine surgery 2016).  Dr. Erik Obey Q000111Q normal, CT sinuses normal, barium swallow normal, trial of protonix no help.     Past Surgical History:  Procedure Laterality Date  . Ant cerv decompression/discectomy w/fusion  C 5-7  04/05/15   C5-7 ACDF  . CATARACT EXTRACTION Bilateral   . COLONOSCOPY  06/29/2019   FOR EVAL OF UNEXPLAINED DIARRHEA-->normal, bx c/w microscopic colitis->uceris and budesonide rx'd by Dr. Silverio Decamp 06/2019  . COLONOSCOPY W/ POLYPECTOMY  08/2012; 05/23/16   2017, adenomatous polyp--recall 5 yrs.  . TRABECULECTOMY Bilateral    for uncontrolled glaucoma   . TRANSURETHRAL RESECTION OF PROSTATE  2008   due to  enlarged prostate  . VASECTOMY  2001    Family History  Problem Relation Age of Onset  . Glaucoma Father   . Heart disease Father   . Neuropathy Father        Peripheral neuropathy  . Arthritis Father   . Parkinson's disease Father   . Colon cancer Neg Hx   . Rectal cancer Neg Hx     Social History   Socioeconomic History  . Marital status: Married    Spouse name: Not on file  . Number of children: 2  . Years of education: Master D.  . Highest education level: Not on file  Occupational History  . Occupation: Immunologist: Building surveyor FOR SELF EMPLOYED    Comment: [employer]  . Occupation: handyman  Tobacco Use  . Smoking status: Former Smoker    Packs/day: 0.75    Years: 15.00    Pack years: 11.25    Types: Cigarettes    Quit date: 06/24/1998    Years since quitting: 21.1  . Smokeless tobacco: Never Used  . Tobacco comment: 2 pack per week  Substance and Sexual Activity  . Alcohol use: Yes    Alcohol/week: 12.0 standard drinks    Types: 12 Cans of beer per week    Comment: Patient drinks 12 beers a week  . Drug use: No  . Sexual activity: Not on file  Other Topics Concern  . Not on file  Social History Narrative   Married.   Educ: MBA   Occupation: Real Product manager   No tob, occ alc, no drugs.   Exercises regularly.   Patient is right handed.   Patient drinks 1 large cup of coffee daily.   Social Determinants of Health   Financial Resource Strain:   . Difficulty of Paying Living Expenses: Not on file  Food Insecurity:   . Worried About Charity fundraiser in the Last Year: Not on file  . Ran Out of Food in the Last Year: Not on file  Transportation Needs:   . Lack of Transportation (Medical): Not on file  . Lack of Transportation (Non-Medical): Not on file  Physical Activity:   . Days of Exercise per Week: Not on file  . Minutes of Exercise per Session: Not on file  Stress:   . Feeling of Stress : Not on file    Social Connections:   . Frequency of Communication with Friends and Family: Not on file  . Frequency of Social Gatherings with Friends and Family: Not on file  . Attends Religious Services: Not on file  . Active Member of Clubs or Organizations: Not on file  . Attends Archivist Meetings: Not on file  . Marital Status: Not on file  Intimate Partner Violence:   . Fear of Current or Ex-Partner: Not on file  . Emotionally Abused: Not on file  . Physically Abused: Not on file  . Sexually Abused: Not on file      Review of systems:  All  other review of systems negative except as mentioned in the HPI.   Physical Exam: Vitals:   08/19/19 0802  BP: 110/68  Pulse: 68  Temp: 98.2 F (36.8 C)   Body mass index is 22.48 kg/m. Gen:      No acute distress Neuro: alert and oriented x 3 Psych: normal mood and affect  Data Reviewed:  Reviewed labs, radiology imaging, old records and pertinent past GI work up   Assessment and Plan/Recommendations:  59 year old male with diarrhea secondary to lymphocytic colitis, resolved with a course of budesonide  Likely etiology for lymphocytic colitis adverse drug reaction to Prozac or NSAIDs Advised patient to avoid or limit NSAID use.  He has been off Prozac since November 2020 Continue to monitor bowel habits Continue high-fiber diet and increase fluid intake  Discontinue Colestid and budesonide  Return as needed  This visit required 20 minutes of patient care (this includes precharting, chart review, review of results, face-to-face time used for counseling as well as treatment plan and follow-up. The patient was provided an opportunity to ask questions and all were answered. The patient agreed with the plan and demonstrated an understanding of the instructions.  Damaris Hippo , MD    CC: McGowen, Adrian Blackwater, MD

## 2019-08-19 NOTE — Patient Instructions (Signed)
If you are age 57 or older, your body mass index should be between 23-30. Your Body mass index is 22.48 kg/m. If this is out of the aforementioned range listed, please consider follow up with your Primary Care Provider.  If you are age 77 or younger, your body mass index should be between 19-25. Your Body mass index is 22.48 kg/m. If this is out of the aformentioned range listed, please consider follow up with your Primary Care Provider.   Follow up as needed.   It was a pleasure to see you today!  Dr Silverio Decamp

## 2019-08-30 ENCOUNTER — Encounter: Payer: Self-pay | Admitting: Family Medicine

## 2019-08-30 ENCOUNTER — Other Ambulatory Visit: Payer: Self-pay

## 2019-08-30 ENCOUNTER — Ambulatory Visit (INDEPENDENT_AMBULATORY_CARE_PROVIDER_SITE_OTHER): Payer: 59 | Admitting: Family Medicine

## 2019-08-30 VITALS — BP 134/83 | HR 59 | Temp 98.3°F | Resp 16 | Ht 73.0 in | Wt 171.8 lb

## 2019-08-30 DIAGNOSIS — R454 Irritability and anger: Secondary | ICD-10-CM | POA: Diagnosis not present

## 2019-08-30 DIAGNOSIS — T50905D Adverse effect of unspecified drugs, medicaments and biological substances, subsequent encounter: Secondary | ICD-10-CM

## 2019-08-30 DIAGNOSIS — Z0000 Encounter for general adult medical examination without abnormal findings: Secondary | ICD-10-CM | POA: Diagnosis not present

## 2019-08-30 DIAGNOSIS — Z125 Encounter for screening for malignant neoplasm of prostate: Secondary | ICD-10-CM | POA: Diagnosis not present

## 2019-08-30 DIAGNOSIS — F411 Generalized anxiety disorder: Secondary | ICD-10-CM | POA: Diagnosis not present

## 2019-08-30 MED ORDER — HYDROCODONE-ACETAMINOPHEN 5-325 MG PO TABS: 1.0000 | ORAL_TABLET | Freq: Four times a day (QID) | ORAL | 0 refills | Status: DC | PRN

## 2019-08-30 MED ORDER — MIRTAZAPINE 30 MG PO TABS: 30.0000 mg | ORAL_TABLET | Freq: Every day | ORAL | 1 refills | Status: DC

## 2019-08-30 NOTE — Patient Instructions (Signed)

## 2019-08-30 NOTE — Addendum Note (Signed)
Addended by: Tammi Sou on: 08/30/2019 02:03 PM   Modules accepted: Orders

## 2019-08-30 NOTE — Progress Notes (Signed)
OFFICE VISIT  08/30/2019   CC:  Chief Complaint  Patient presents with  . Annual Exam    pt is fasting   HPI:    Patient is a 60 y.o. Caucasian male who presents for annual health maintenance exam.  A/P as of last visit 07/28/19: "1) Anger/irritability problems, not too much anxiety, no depressed mood. Will increase the wellbutrin xl to 300mg  qd and give this more time. He is aware of potential side effects of this med. Has clonaz on hand to use prn but is not using it b/c he essentially forgets that he has it.  2) Peripheral neuropathy: continue gabapentin and prn hydrocodone/apap. Hopefully he'll see some improvement from the wellbutrin, similar to what the prozac did for him.  3) Microscopic colitis: his diarrhea is resolved. Fluoxetine not likely the cause of this but pt's apprehension regarding the fluoxetine is understandable and we'll avoid this med in future."  Interim hx:  He is not taking the wellbutrin we increased the dose of last visit.  Still too much anger, impulsiveness, anxiety. He researched, wants to try mirtazapine b/c it seems to have less incidence of ED issues.  Periph neurop pain: rare use, although does need new rx today. Anx: he does still use clonaz prn, more lately since wellbutrin no help.  Helps him sleep when neighborhood dogs bark a lot.  PMP AWARE reviewed today: most recent rx for clonaz 0.5mg  was filled 02/25/19, # 30, rx by me. Most recent vicodin rx filled 02/20/20, #30, rx by me. No red flags.   Past Medical History:  Diagnosis Date  . Anxiety   . BPH (benign prostatic hypertrophy)   . Cataract    artifiicial lenses in both eyes  . Cervical spondylosis    with radiculopathy not responsive to conservative therapies:  [facility]: ACDF surgery C5-C7 performed 04/05/15  . Chronic pain syndrome    cervicalgia  . Difficulty controlling anger    + GAD with OCD tendencies.  Marland Kitchen Dysplastic nevus 10/2015   left mid back:  Dr. Denna Haggard following  . Glaucoma   . History of adenomatous polyp of colon   . IBS (irritable bowel syndrome)    +constipation  . Lateral epicondylitis of left elbow   . Lymphocytic colitis 05/2019, 2021   Colonoscopy bx +  . Microscopic colitis 06/2019   colonoscopy + 06/2019  . Neuropathic pain   . Peripheral neuropathy    Idiopathic, inherited per pt's neurologist, Dr. Everette Rank. Various labs normal 07/2016 except vitamin B6 was siginificantly elevated.  Allergist eval/allergy testing (including food) ALL NORMAL/NEG 07/2016.  Marland Kitchen Restless legs syndrome 09/27/2014  . Throat irritation    chronic (since cervical spine surgery 2016).  Dr. Erik Obey [id number] normal, CT sinuses normal, barium swallow normal, trial of protonix no help.     Past Surgical History:  Procedure Laterality Date  . Ant cerv decompression/discectomy w/fusion C 5-7  04/05/15   C5-7 ACDF  . CATARACT EXTRACTION Bilateral   . COLONOSCOPY  06/29/2019   FOR EVAL OF UNEXPLAINED DIARRHEA-->normal, bx c/w microscopic colitis->uceris and budesonide rx'd by Dr. Silverio Decamp 06/2019  . COLONOSCOPY W/ POLYPECTOMY  08/2012; 05/23/16   2017, adenomatous polyp--recall 5 yrs.  . TRABECULECTOMY Bilateral    for uncontrolled glaucoma   . TRANSURETHRAL RESECTION OF PROSTATE  2008   due to enlarged prostate  . VASECTOMY  2001    Outpatient Medications Prior to Visit  Medication Sig Dispense Refill  . Alpha-Lipoic Acid 300 MG  CAPS Take 1 capsule by mouth 2 (two) times daily.    . AMBULATORY NON FORMULARY MEDICATION Medication Name: CBD Oil- Take 15mg  qhs    . clonazePAM (KLONOPIN) 0.5 MG tablet 1-2 tabs po bid prn IF YOU HAVE INCREASED ANXIETY 30 tablet 1  . gabapentin (NEURONTIN) 300 MG capsule Take 900 mg by mouth 2 (two) times daily.    . Magnesium 300 MG CAPS Take by mouth as needed.     . Omega-3 Fatty Acids (OMEGA-3 FISH OIL) 300 MG CAPS Take 1 capsule by mouth daily.    Marland Kitchen HYDROcodone-acetaminophen (NORCO/VICODIN) 5-325 MG  tablet Take 1-2 tablets by mouth every 6 (six) hours as needed for moderate pain. 30 tablet 0  . buPROPion (WELLBUTRIN XL) 300 MG 24 hr tablet Take 1 tablet (300 mg total) by mouth daily. (Patient not taking: Reported on 08/30/2019) 30 tablet 1   No facility-administered medications prior to visit.    No Known Allergies  Review of Systems  Constitutional: Negative for appetite change, chills, fatigue and fever.  HENT: Negative for congestion, dental problem, ear pain and sore throat.   Eyes: Negative for discharge, redness and visual disturbance.  Respiratory: Negative for cough, chest tightness, shortness of breath and wheezing.   Cardiovascular: Negative for chest pain, palpitations and leg swelling.  Gastrointestinal: Negative for abdominal pain, blood in stool, diarrhea, nausea and vomiting.  Genitourinary: Negative for difficulty urinating, dysuria, flank pain, frequency, hematuria and urgency.  Musculoskeletal: Negative for arthralgias, back pain, joint swelling, myalgias and neck stiffness.  Skin: Negative for pallor and rash.  Neurological: Negative for dizziness, speech difficulty, weakness and headaches.  Hematological: Negative for adenopathy. Does not bruise/bleed easily.  Psychiatric/Behavioral: Negative for confusion and sleep disturbance. The patient is not nervous/anxious.     PE: Blood pressure 134/83, pulse (!) 59, temperature 98.3 F (36.8 C), temperature source Temporal, resp. rate 16, height 6\' 1"  (1.854 m), weight 171 lb 12.8 oz (77.9 kg), SpO2 97 %. Body mass index is 22.67 kg/m.  Gen: Alert, well appearing.  Patient is oriented to person, place, time, and situation. AFFECT: pleasant, lucid thought and speech. ENT: Ears: EACs clear, normal epithelium.  TMs with good light reflex and landmarks bilaterally.  Eyes: no injection, icteris, swelling, or exudate.  EOMI, PERRLA. Nose: no drainage or turbinate edema/swelling.  No injection or focal lesion.  Mouth: lips  without lesion/swelling.  Oral mucosa pink and moist.  Dentition intact and without obvious caries or gingival swelling.  Oropharynx without erythema, exudate, or swelling.  Neck: supple/nontender.  No LAD, mass, or TM.  Carotid pulses 2+ bilaterally, without bruits. CV: RRR, no m/r/g.   LUNGS: CTA bilat, nonlabored resps, good aeration in all lung fields. ABD: soft, NT, ND, BS normal.  No hepatospenomegaly or mass.  No bruits. EXT: no clubbing, cyanosis, or edema.  Musculoskeletal: no joint swelling, erythema, warmth, or tenderness.  ROM of all joints intact. Skin - no sores or suspicious lesions or rashes or color changes Rectal exam: negative without mass, lesions or tenderness, PROSTATE EXAM: smooth and symmetric without nodules or tenderness.    LABS:  Lab Results  Component Value Date   TSH 1.64 12/15/2017   Lab Results  Component Value Date   WBC 6.1 07/31/2018   HGB 16.9 07/31/2018   HCT 49.3 07/31/2018   MCV 89.8 07/31/2018   PLT 184 07/31/2018   Lab Results  Component Value Date   CREATININE 1.23 07/31/2018   BUN 22 07/31/2018   NA  140 07/31/2018   K 4.7 07/31/2018   CL 103 07/31/2018   CO2 29 07/31/2018   Lab Results  Component Value Date   ALT 21 07/31/2018   AST 23 07/31/2018   ALKPHOS 64 12/12/2016   BILITOT 0.6 07/31/2018   Lab Results  Component Value Date   CHOL 151 12/15/2017   Lab Results  Component Value Date   HDL 69 12/15/2017   Lab Results  Component Value Date   LDLCALC 62 12/15/2017   Lab Results  Component Value Date   TRIG 112 12/15/2017   Lab Results  Component Value Date   CHOLHDL 2.2 12/15/2017   Lab Results  Component Value Date   PSA 1.3 12/15/2017   PSA 1.1 12/12/2016   PSA 1.71 12/12/2015    IMPRESSION AND PLAN:  Health maintenance exam: Reviewed age and gender appropriate health maintenance issues (prudent diet, regular exercise, health risks of tobacco and excessive alcohol, use of seatbelts, fire alarms in  home, use of sunscreen).  Also reviewed age and gender appropriate health screening as well as vaccine recommendations. Vaccines: ALL UTD, including shingrix. Labs: fasting HP labs ordered. Prostate ca screening: DRE normal , PSA. Colon ca screening: he just got colonoscopy 06/2019 for diarrhea; dx'd with lymphocytic colitis.  Recall 10 yrs.  Anger control problems/anxiety/irritability: remeron trial started today. Fluox---he thinks this caused his colitis AND it he does assoc it with ED. Recent wellbutrin trial NO HELP. Will do trial of remeron 30mg  qd. Therapeutic expectations and side effect profile of medication discussed today.  Patient's questions answered.  An After Visit Summary was printed and given to the patient.  FOLLOW UP: Return in about 4 weeks (around 09/27/2019) for f/u anger/anx.  Signed:  Crissie Sickles, MD           08/30/2019

## 2019-08-31 LAB — COMPREHENSIVE METABOLIC PANEL
AG Ratio: 1.9 (calc) (ref 1.0–2.5)
ALT: 16 U/L (ref 9–46)
AST: 18 U/L (ref 10–35)
Albumin: 4.4 g/dL (ref 3.6–5.1)
Alkaline phosphatase (APISO): 68 U/L (ref 35–144)
BUN: 14 mg/dL (ref 7–25)
CO2: 27 mmol/L (ref 20–32)
Calcium: 9.6 mg/dL (ref 8.6–10.3)
Chloride: 104 mmol/L (ref 98–110)
Creat: 1.15 mg/dL (ref 0.70–1.33)
Globulin: 2.3 g/dL (calc) (ref 1.9–3.7)
Glucose, Bld: 86 mg/dL (ref 65–99)
Potassium: 4.6 mmol/L (ref 3.5–5.3)
Sodium: 141 mmol/L (ref 135–146)
Total Bilirubin: 0.7 mg/dL (ref 0.2–1.2)
Total Protein: 6.7 g/dL (ref 6.1–8.1)

## 2019-08-31 LAB — LIPID PANEL
Cholesterol: 173 mg/dL (ref <200)
HDL: 77 mg/dL (ref >=40)
LDL Cholesterol (Calc): 79 mg/dL (calc)
Non-HDL Cholesterol (Calc): 96 mg/dL (calc) (ref <130)
Total CHOL/HDL Ratio: 2.2 (calc) (ref <5.0)
Triglycerides: 87 mg/dL (ref <150)

## 2019-08-31 LAB — CBC WITH DIFFERENTIAL/PLATELET
Absolute Monocytes: 488 cells/uL (ref 200–950)
Basophils Absolute: 43 cells/uL (ref 0–200)
Basophils Relative: 0.7 %
Eosinophils Absolute: 98 cells/uL (ref 15–500)
Eosinophils Relative: 1.6 %
HCT: 47.2 % (ref 38.5–50.0)
Hemoglobin: 16 g/dL (ref 13.2–17.1)
Lymphs Abs: 1251 cells/uL (ref 850–3900)
MCH: 30.7 pg (ref 27.0–33.0)
MCHC: 33.9 g/dL (ref 32.0–36.0)
MCV: 90.4 fL (ref 80.0–100.0)
MPV: 10.4 fL (ref 7.5–12.5)
Monocytes Relative: 8 %
Neutro Abs: 4221 cells/uL (ref 1500–7800)
Neutrophils Relative %: 69.2 %
Platelets: 196 10*3/uL (ref 140–400)
RBC: 5.22 10*6/uL (ref 4.20–5.80)
RDW: 12.7 % (ref 11.0–15.0)
Total Lymphocyte: 20.5 %
WBC: 6.1 10*3/uL (ref 3.8–10.8)

## 2019-08-31 LAB — PSA: PSA: 1.2 ng/mL (ref <=4.0)

## 2019-08-31 LAB — TSH: TSH: 2.26 mIU/L (ref 0.40–4.50)

## 2019-09-27 ENCOUNTER — Other Ambulatory Visit: Payer: Self-pay

## 2019-09-27 ENCOUNTER — Ambulatory Visit (INDEPENDENT_AMBULATORY_CARE_PROVIDER_SITE_OTHER): Payer: 59 | Admitting: Family Medicine

## 2019-09-27 ENCOUNTER — Encounter: Payer: Self-pay | Admitting: Family Medicine

## 2019-09-27 VITALS — BP 124/78 | HR 60 | Temp 97.9°F | Resp 16 | Ht 73.0 in | Wt 174.8 lb

## 2019-09-27 DIAGNOSIS — R454 Irritability and anger: Secondary | ICD-10-CM | POA: Diagnosis not present

## 2019-09-27 DIAGNOSIS — F419 Anxiety disorder, unspecified: Secondary | ICD-10-CM | POA: Diagnosis not present

## 2019-09-27 NOTE — Progress Notes (Signed)
OFFICE VISIT  09/27/2019   CC:  Chief Complaint  Patient presents with  . Follow-up    anger/anxiety    HPI:    Patient is a 59 y.o. Caucasian male who presents for 1 mo f/u anger control problems and anxiety. A/P as of last visit: "Anger control problems/anxiety/irritability: remeron trial started today. Fluox---he thinks this caused his colitis AND it he does assoc it with ED. Recent wellbutrin trial NO HELP. Will do trial of remeron 30mg  qd."  Interim hx: No anx/anger control effects noted--has been on it 3 wks. Makes him tired.  Sleeping better most nights. No change in clonaz use, still uses this only occ.   Past Medical History:  Diagnosis Date  . Anxiety   . BPH (benign prostatic hypertrophy)   . Cataract    artifiicial lenses in both eyes  . Cervical spondylosis    with radiculopathy not responsive to conservative therapies:  [facility]: ACDF surgery C5-C7 performed 04/05/15  . Chronic pain syndrome    cervicalgia  . Difficulty controlling anger    + GAD with OCD tendencies.  Marland Kitchen Dysplastic nevus 10/2015   left mid back: Dr. Denna Haggard following  . Glaucoma   . History of adenomatous polyp of colon   . IBS (irritable bowel syndrome)    +constipation  . Lateral epicondylitis of left elbow   . Lymphocytic colitis 05/2019, 2021   Colonoscopy bx +  . Microscopic colitis 06/2019   colonoscopy + 06/2019  . Neuropathic pain   . Peripheral neuropathy    Idiopathic, inherited per pt's neurologist, Dr. Everette Rank. Various labs normal 07/2016 except vitamin B6 was siginificantly elevated.  Allergist eval/allergy testing (including food) ALL NORMAL/NEG 07/2016.  Marland Kitchen Restless legs syndrome 09/27/2014  . Throat irritation    chronic (since cervical spine surgery 2016).  Dr. Erik Obey [id number] normal, CT sinuses normal, barium swallow normal, trial of protonix no help.     Past Surgical History:  Procedure Laterality Date  . Ant cerv  decompression/discectomy w/fusion C 5-7  04/05/15   C5-7 ACDF  . CATARACT EXTRACTION Bilateral   . COLONOSCOPY  06/29/2019   FOR EVAL OF UNEXPLAINED DIARRHEA-->normal, bx c/w microscopic colitis->uceris and budesonide rx'd by Dr. Silverio Decamp 06/2019  . COLONOSCOPY W/ POLYPECTOMY  08/2012; 05/23/16   2017, adenomatous polyp--recall 5 yrs.  . TRABECULECTOMY Bilateral    for uncontrolled glaucoma   . TRANSURETHRAL RESECTION OF PROSTATE  2008   due to enlarged prostate  . VASECTOMY  2001    Outpatient Medications Prior to Visit  Medication Sig Dispense Refill  . Alpha-Lipoic Acid 300 MG CAPS Take 1 capsule by mouth 2 (two) times daily.    . AMBULATORY NON FORMULARY MEDICATION Medication Name: CBD Oil- Take 15mg  qhs    . clonazePAM (KLONOPIN) 0.5 MG tablet 1-2 tabs po bid prn IF YOU HAVE INCREASED ANXIETY 30 tablet 1  . gabapentin (NEURONTIN) 300 MG capsule Take 900 mg by mouth 2 (two) times daily.    Marland Kitchen HYDROcodone-acetaminophen (NORCO/VICODIN) 5-325 MG tablet Take 1-2 tablets by mouth every 6 (six) hours as needed for moderate pain. 30 tablet 0  . Magnesium 300 MG CAPS Take by mouth as needed.     . mirtazapine (REMERON) 30 MG tablet Take 1 tablet (30 mg total) by mouth at bedtime. 30 tablet 1  . Omega-3 Fatty Acids (OMEGA-3 FISH OIL) 300 MG CAPS Take 1 capsule by mouth daily.     No facility-administered medications prior to visit.  No Known Allergies  ROS As per HPI  PE: Blood pressure 124/78, pulse 60, temperature 97.9 F (36.6 C), temperature source Temporal, resp. rate 16, height 6\' 1"  (1.854 m), weight 174 lb 12.8 oz (79.3 kg), SpO2 99 %. Gen: Alert, well appearing.  Patient is oriented to person, place, time, and situation. AFFECT: pleasant, lucid thought and speech. CV: RRR, no m/r/g.   LUNGS: CTA bilat, nonlabored resps, good aeration in all lung fields.   LABS:    Chemistry      Component Value Date/Time   NA 141 08/30/2019 1022   K 4.6 08/30/2019 1022   CL 104  08/30/2019 1022   CO2 27 08/30/2019 1022   BUN 14 08/30/2019 1022   CREATININE 1.15 08/30/2019 1022      Component Value Date/Time   CALCIUM 9.6 08/30/2019 1022   ALKPHOS 64 12/12/2016 0846   AST 18 08/30/2019 1022   ALT 16 08/30/2019 1022   BILITOT 0.7 08/30/2019 1022      IMPRESSION AND PLAN:  Anger control problems with GAD. No positive changes yet. Some fatigue/sleepiness in daytime but improving gradually. Sleeping better on this med, though. No changes. We'll see how he is doing in 5 wks-->he will have been on the med 2 months at that point.  An After Visit Summary was printed and given to the patient.  FOLLOW UP: Return in about 5 weeks (around 11/01/2019) for f/u anx.  Signed:  Crissie Sickles, MD           09/27/2019

## 2019-11-01 ENCOUNTER — Ambulatory Visit: Payer: 59 | Admitting: Family Medicine

## 2020-01-24 ENCOUNTER — Encounter: Payer: Self-pay | Admitting: Family Medicine

## 2020-01-28 ENCOUNTER — Encounter: Payer: Self-pay | Admitting: Family Medicine

## 2020-01-31 NOTE — Telephone Encounter (Signed)
Patient sent first mychart message on Friday requesting to go back on Fluoxetine 20mg . This medication is no longer on his medication list and he was already due for follow up appointment. He was advised to schedule a provider appt to further discuss. Patient sent the following mychart message stating he would prefer PCP to directly answer his messages.  Please see below.

## 2020-02-01 ENCOUNTER — Other Ambulatory Visit: Payer: Self-pay

## 2020-02-03 ENCOUNTER — Encounter: Payer: Self-pay | Admitting: Family Medicine

## 2020-02-03 ENCOUNTER — Ambulatory Visit (INDEPENDENT_AMBULATORY_CARE_PROVIDER_SITE_OTHER): Payer: 59 | Admitting: Family Medicine

## 2020-02-03 ENCOUNTER — Other Ambulatory Visit: Payer: Self-pay

## 2020-02-03 VITALS — BP 106/68 | HR 59 | Temp 98.2°F | Resp 16 | Ht 73.0 in | Wt 166.8 lb

## 2020-02-03 DIAGNOSIS — F329 Major depressive disorder, single episode, unspecified: Secondary | ICD-10-CM

## 2020-02-03 DIAGNOSIS — F419 Anxiety disorder, unspecified: Secondary | ICD-10-CM

## 2020-02-03 DIAGNOSIS — T887XXA Unspecified adverse effect of drug or medicament, initial encounter: Secondary | ICD-10-CM | POA: Diagnosis not present

## 2020-02-03 DIAGNOSIS — F32A Depression, unspecified: Secondary | ICD-10-CM

## 2020-02-03 DIAGNOSIS — R454 Irritability and anger: Secondary | ICD-10-CM

## 2020-02-03 MED ORDER — FLUOXETINE HCL 20 MG PO CAPS: 20.0000 mg | ORAL_CAPSULE | Freq: Every day | ORAL | 1 refills | Status: DC

## 2020-02-03 NOTE — Progress Notes (Signed)
OFFICE VISIT  02/13/2020   CC:  Chief Complaint  Patient presents with  . Discuss mood   HPI:    Patient is a 59 y.o. Caucasian male who presents for discussion of depressed mood. A/P as of last visit about 4 mo ago: "Anger control problems with GAD. No positive changes from remeron yet. Some fatigue/sleepiness in daytime but improving gradually. Sleeping better on this med, though. No changes. We'll see how he is doing in 5 wks-->he will have been on the med 2 months at that point."  INTERIM HX: Says he feels mildly "drunk". Stopped remeron.  At most recent appt with neurologist he was started on venlafaxine to try to help with periph neurop pain. Unclear mg of tab, says was on it approx 3 mo and was on "2 tabs twice a day". Weaned himself off this med over 2 wks, most recent dose was 2 d/a.  Yesterday started to feel some lightheaded feeling. No fevers, no HA.  No vertigo, focal weakness, or tremors. No depressed mood.  He does have some anxiety, has same anger control/irritability he usually has.  Has flare of his neck pain with bilat hands paresthesias lately and this is likely contributing. Takes clonaz rarely, mainly when he can't sleep.  Helps.  Recalls feeling like fluoxetine helped with is anx/anger sx's AND his PN discomfort. He had been on this 79mo, dose titrated up to 40 qd, had some ED side effects. Shortly after this he got daily diarrhea/colitis---lasted 9 wks.  Past Medical History:  Diagnosis Date  . Anxiety   . BPH (benign prostatic hypertrophy)   . Cataract    artifiicial lenses in both eyes  . Cervical spondylosis    with radiculopathy not responsive to conservative therapies:  [facility]: ACDF surgery C5-C7 performed 04/05/15  . Chronic pain syndrome    cervicalgia  . Difficulty controlling anger    + GAD with OCD tendencies.  Marland Kitchen Dysplastic nevus 10/2015   left mid back: Dr. Denna Haggard following  . Glaucoma   . History of  adenomatous polyp of colon   . IBS (irritable bowel syndrome)    +constipation  . Lateral epicondylitis of left elbow   . Lymphocytic colitis 05/2019, 2021   Colonoscopy bx +  . Microscopic colitis 06/2019   colonoscopy + 06/2019  . Neuropathic pain   . Peripheral neuropathy    Idiopathic, inherited per pt's neurologist, Dr. Everette Rank. Various labs normal 07/2016 except vitamin B6 was siginificantly elevated.  Allergist eval/allergy testing (including food) ALL NORMAL/NEG 07/2016.  Marland Kitchen Restless legs syndrome 09/27/2014  . Throat irritation    chronic (since cervical spine surgery 2016).  Dr. Erik Obey [id number] normal, CT sinuses normal, barium swallow normal, trial of protonix no help.     Past Surgical History:  Procedure Laterality Date  . Ant cerv decompression/discectomy w/fusion C 5-7  04/05/15   C5-7 ACDF  . CATARACT EXTRACTION Bilateral   . COLONOSCOPY  06/29/2019   FOR EVAL OF UNEXPLAINED DIARRHEA-->normal, bx c/w microscopic colitis->uceris and budesonide rx'd by Dr. Silverio Decamp 06/2019  . COLONOSCOPY W/ POLYPECTOMY  08/2012; 05/23/16   2017, adenomatous polyp--recall 5 yrs.  . TRABECULECTOMY Bilateral    for uncontrolled glaucoma   . TRANSURETHRAL RESECTION OF PROSTATE  2008   due to enlarged prostate  . VASECTOMY  2001    Outpatient Medications Prior to Visit  Medication Sig Dispense Refill  . Alpha-Lipoic Acid 300 MG CAPS Take 1 capsule by mouth 2 (two) times  daily.    . AMBULATORY NON FORMULARY MEDICATION Medication Name: CBD Oil- Take 15mg  qhs    . clonazePAM (KLONOPIN) 0.5 MG tablet 1-2 tabs po bid prn IF YOU HAVE INCREASED ANXIETY 30 tablet 1  . gabapentin (NEURONTIN) 300 MG capsule Take 900 mg by mouth 2 (two) times daily.    Marland Kitchen HYDROcodone-acetaminophen (NORCO/VICODIN) 5-325 MG tablet Take 1-2 tablets by mouth every 6 (six) hours as needed for moderate pain. 30 tablet 0  . Magnesium 300 MG CAPS Take by mouth as needed.     . Omega-3 Fatty Acids (OMEGA-3 FISH OIL) 300  MG CAPS Take 1 capsule by mouth daily.    . polyethylene glycol powder (GLYCOLAX/MIRALAX) 17 GM/SCOOP powder Take by mouth.    . gabapentin (NEURONTIN) 600 MG tablet Take 600 mg by mouth 3 (three) times daily. (Patient not taking: Reported on 02/03/2020)    . mirtazapine (REMERON) 30 MG tablet Take 1 tablet (30 mg total) by mouth at bedtime. (Patient not taking: Reported on 02/03/2020) 30 tablet 1  . venlafaxine (EFFEXOR) 37.5 MG tablet Take by mouth 2 (two) times daily. (Patient not taking: Reported on 02/03/2020)     No facility-administered medications prior to visit.    No Known Allergies  ROS As per HPI  PE: Vitals with BMI 02/03/2020 09/27/2019 08/30/2019  Height 6\' 1"  6\' 1"  6\' 1"   Weight 166 lbs 13 oz 174 lbs 13 oz 171 lbs 13 oz  BMI 22.01 99.24 26.83  Systolic 419 622 297  Diastolic 68 78 83  Pulse 59 60 59  O2 sat on RA today is 99%  Gen: Alert, well appearing.  Patient is oriented to person, place, time, and situation. AFFECT: pleasant, lucid thought and speech. Neuro: CN 2-12 intact bilaterally, strength 5/5 in proximal and distal upper extremities and lower extremities bilaterally.  No sensory deficits.  No tremor.  No disdiadochokinesis.  No ataxia.  Upper extremity and lower extremity DTRs symmetric.  No pronator drift.   LABS:    Chemistry      Component Value Date/Time   NA 141 08/30/2019 1022   K 4.6 08/30/2019 1022   CL 104 08/30/2019 1022   CO2 27 08/30/2019 1022   BUN 14 08/30/2019 1022   CREATININE 1.15 08/30/2019 1022      Component Value Date/Time   CALCIUM 9.6 08/30/2019 1022   ALKPHOS 64 12/12/2016 0846   AST 18 08/30/2019 1022   ALT 16 08/30/2019 1022   BILITOT 0.7 08/30/2019 1022     Lab Results  Component Value Date   TSH 2.26 08/30/2019   Lab Results  Component Value Date   WBC 6.1 08/30/2019   HGB 16.0 08/30/2019   HCT 47.2 08/30/2019   MCV 90.4 08/30/2019   PLT 196 08/30/2019   Lab Results  Component Value Date   CHOL 173 08/30/2019    HDL 77 08/30/2019   LDLCALC 79 08/30/2019   TRIG 87 08/30/2019   CHOLHDL 2.2 08/30/2019    IMPRESSION AND PLAN:  Anxiety, mild depression (chronic), anger control/irritability problems. Failed citalopram. Failed venlafaxine, + recent sx's likely from wean off venlafaxine. Pt recalls imp in anx, mood, and PN pain while on fluoxetine.  He has been hesitant to get back on this med b/c when we increased him from 20mg  qd to 40mg  qd dosing he developed colitis/diarrhea shortly after.  He is worried that this was the cause of his colitis. He wants to go ahead and restart fluox 20mg  qd at  this point and see how it goes  An After Visit Summary was printed and given to the patient.  FOLLOW UP: Return in about 6 weeks (around 03/16/2020) for f/u psych med/anxiety.  Signed:  Crissie Sickles, MD           02/13/2020

## 2020-03-04 ENCOUNTER — Encounter: Payer: Self-pay | Admitting: Family Medicine

## 2020-03-15 ENCOUNTER — Other Ambulatory Visit: Payer: Self-pay

## 2020-03-17 ENCOUNTER — Other Ambulatory Visit: Payer: Self-pay

## 2020-03-17 ENCOUNTER — Ambulatory Visit (INDEPENDENT_AMBULATORY_CARE_PROVIDER_SITE_OTHER): Payer: 59 | Admitting: Family Medicine

## 2020-03-17 ENCOUNTER — Encounter: Payer: Self-pay | Admitting: Family Medicine

## 2020-03-17 VITALS — BP 106/71 | HR 54 | Temp 98.0°F | Resp 18 | Ht 73.0 in | Wt 166.2 lb

## 2020-03-17 DIAGNOSIS — R454 Irritability and anger: Secondary | ICD-10-CM

## 2020-03-17 DIAGNOSIS — Z23 Encounter for immunization: Secondary | ICD-10-CM

## 2020-03-17 DIAGNOSIS — F329 Major depressive disorder, single episode, unspecified: Secondary | ICD-10-CM | POA: Diagnosis not present

## 2020-03-17 DIAGNOSIS — F411 Generalized anxiety disorder: Secondary | ICD-10-CM

## 2020-03-17 DIAGNOSIS — F32A Depression, unspecified: Secondary | ICD-10-CM

## 2020-03-17 MED ORDER — FLUOXETINE HCL 20 MG PO CAPS
20.0000 mg | ORAL_CAPSULE | Freq: Every day | ORAL | 2 refills | Status: DC
Start: 2020-03-17 — End: 2020-06-15

## 2020-03-17 NOTE — Addendum Note (Signed)
Addended byDamita Dunnings D on: 03/17/2020 09:49 AM   Modules accepted: Orders

## 2020-03-17 NOTE — Progress Notes (Signed)
OFFICE VISIT  03/17/2020  CC:  Chief Complaint  Patient presents with  . Follow-up    Not Fasting    HPI:    Patient is a 59 y.o. Caucasian male who presents for 6 wk f/u chronic anxiety and chronic mild depression manifested primarily by signiificant irritability and anger control issues. A/P as of last visit: "Anxiety, mild depression (chronic), anger control/irritability problems. Failed citalopram. Failed venlafaxine, + recent sx's likely from wean off venlafaxine. Pt recalls imp in anx, mood, and PN pain while on fluoxetine.  He has been hesitant to get back on this med b/c when we increased him from 20mg  qd to 40mg  qd dosing he developed colitis/diarrhea shortly after.  He is worried that this was the cause of his colitis. He wants to go ahead and restart fluox 20mg  qd at this point and see how it goes"  INTERIM HX: "Going ok."  W/drawal sx's stopped after 1 wk more of being off venlafaxine, then he started fluox 20mg  qd. Some improved sense of feeling calm.  Still some feelings of mild down mood but nothing persistent.  Still w/chronic trouble sleeping.  No SI or HI.  Mild anhedonia a couple days a week. After about 2 wks of taking it he started getting off/on stomach cramps, some intermittent softer stools but no diarrhea.  Worse when he drinks beer. Just started 2nd month of the 20mg  fluox dose.  Past Medical History:  Diagnosis Date  . Anxiety   . BPH (benign prostatic hypertrophy)   . Cataract    artifiicial lenses in both eyes  . Cervical spondylosis    with radiculopathy not responsive to conservative therapies:  [facility]: ACDF surgery C5-C7 performed 04/05/15  . Chronic pain syndrome    cervicalgia, heredetary PN, LBP  . Difficulty controlling anger    + GAD with OCD tendencies + memory changes---consider neuropsych testing  . Dysplastic nevus 10/2015   left mid back: Dr. Denna Haggard following  . Glaucoma   . History of adenomatous polyp of  colon   . IBS (irritable bowel syndrome)    +constipation  . Lateral epicondylitis of left elbow   . Lymphocytic colitis 05/2019, 2021   Colonoscopy bx +  . Microscopic colitis 06/2019   colonoscopy + 06/2019  . Neuropathic pain   . Peripheral neuropathy    Idiopathic, inherited per pt's neurologist, Dr. Everette Rank. Various labs normal 07/2016 except vitamin B6 was siginificantly elevated.  Allergist eval/allergy testing (including food) ALL NORMAL/NEG 07/2016.  Marland Kitchen Restless legs syndrome 09/27/2014  . Throat irritation    chronic (since cervical spine surgery 2016).  Dr. Erik Obey [id number] normal, CT sinuses normal, barium swallow normal, trial of protonix no help.     Past Surgical History:  Procedure Laterality Date  . Ant cerv decompression/discectomy w/fusion C 5-7  04/05/15   C5-7 ACDF  . CATARACT EXTRACTION Bilateral   . COLONOSCOPY  06/29/2019   FOR EVAL OF UNEXPLAINED DIARRHEA-->normal, bx c/w microscopic colitis->uceris and budesonide rx'd by Dr. Silverio Decamp 06/2019  . COLONOSCOPY W/ POLYPECTOMY  08/2012; 05/23/16   2017, adenomatous polyp--recall 5 yrs.  . TRABECULECTOMY Bilateral    for uncontrolled glaucoma   . TRANSURETHRAL RESECTION OF PROSTATE  2008   due to enlarged prostate  . VASECTOMY  2001    Outpatient Medications Prior to Visit  Medication Sig Dispense Refill  . Alpha-Lipoic Acid 300 MG CAPS Take 1 capsule by mouth 2 (two) times daily.    . AMBULATORY NON  FORMULARY MEDICATION Medication Name: CBD Oil- Take 15mg  qhs    . clonazePAM (KLONOPIN) 0.5 MG tablet 1-2 tabs po bid prn IF YOU HAVE INCREASED ANXIETY 30 tablet 1  . gabapentin (NEURONTIN) 300 MG capsule Take 900 mg by mouth 2 (two) times daily.    Marland Kitchen gabapentin (NEURONTIN) 600 MG tablet TAKE ONE AND ONE-HALF TABLETS IN THE MORNING AND ONE AND ONE-HALF TABLETS IN THE EVENING    . HYDROcodone-acetaminophen (NORCO/VICODIN) 5-325 MG tablet Take 1-2 tablets by mouth every 6 (six) hours as needed for moderate pain.  30 tablet 0  . Magnesium 300 MG CAPS Take by mouth as needed.     . Omega-3 Fatty Acids (OMEGA-3 FISH OIL) 300 MG CAPS Take 1 capsule by mouth daily.    . polyethylene glycol powder (GLYCOLAX/MIRALAX) 17 GM/SCOOP powder Take by mouth.    Marland Kitchen FLUoxetine (PROZAC) 20 MG capsule Take 1 capsule (20 mg total) by mouth daily. 30 capsule 1  . NON FORMULARY Medication Name: CBD Oil- Take 15mg  qhs     No facility-administered medications prior to visit.    No Known Allergies  ROS As per HPI  PE: Vitals with BMI 03/17/2020 02/03/2020 09/27/2019  Height 6\' 1"  6\' 1"  6\' 1"   Weight 166 lbs 4 oz 166 lbs 13 oz 174 lbs 13 oz  BMI 21.94 42.59 56.38  Systolic 756 433 295  Diastolic 71 68 78  Pulse 54 59 60     Gen: Alert, well appearing.  Patient is oriented to person, place, time, and situation. AFFECT: pleasant, lucid thought and speech. No further exam today.  LABS:  Lab Results  Component Value Date   TSH 2.26 08/30/2019   Lab Results  Component Value Date   WBC 6.1 08/30/2019   HGB 16.0 08/30/2019   HCT 47.2 08/30/2019   MCV 90.4 08/30/2019   PLT 196 08/30/2019   Lab Results  Component Value Date   CREATININE 1.15 08/30/2019   BUN 14 08/30/2019   NA 141 08/30/2019   K 4.6 08/30/2019   CL 104 08/30/2019   CO2 27 08/30/2019   Lab Results  Component Value Date   ALT 16 08/30/2019   AST 18 08/30/2019   ALKPHOS 64 12/12/2016   BILITOT 0.7 08/30/2019   Lab Results  Component Value Date   CHOL 173 08/30/2019   Lab Results  Component Value Date   HDL 77 08/30/2019   Lab Results  Component Value Date   LDLCALC 79 08/30/2019   Lab Results  Component Value Date   TRIG 87 08/30/2019   Lab Results  Component Value Date   CHOLHDL 2.2 08/30/2019   Lab Results  Component Value Date   PSA 1.2 08/30/2019   PSA 1.3 12/15/2017   PSA 1.1 12/12/2016    IMPRESSION AND PLAN:  Chronic anxiety and depression that manifests primarily as irritability and anger. Mild  improvement since back on fluox 20mg  x 5 wks now.  Some mild GI side effects that he is willing to tolerate and ride out for a while.  Will give 20mg  qd dose at least 6 more weeks and he'll call and let me know if he feels ready to go up to 40mg  qd dose or not.  Today I sent in 20 mg fluox caps to take 1 qd, #30, RF x 2---instructions to fill when pt requests. No labs needed  An After Visit Summary was printed and given to the patient.  FOLLOW UP: Return in about 3 months (  around 06/16/2020) for f/u anx/mood/med.  Signed:  Crissie Sickles, MD           03/17/2020

## 2020-05-25 ENCOUNTER — Encounter: Payer: Self-pay | Admitting: Family Medicine

## 2020-05-25 ENCOUNTER — Other Ambulatory Visit: Payer: Self-pay

## 2020-05-25 NOTE — Telephone Encounter (Signed)
Requesting: [employer] Contract: 02/25/19 UDS: n/a  Last Visit:03/17/20 Next Visit:06/15/20 Last Refill:08/30/19(30,0)  Please Advise. Medication pending

## 2020-05-25 NOTE — Telephone Encounter (Signed)
RF request sent to PCP for review.

## 2020-05-28 MED ORDER — HYDROCODONE-ACETAMINOPHEN 5-325 MG PO TABS: 1.0000 | ORAL_TABLET | Freq: Four times a day (QID) | ORAL | 0 refills | Status: DC | PRN

## 2020-06-15 ENCOUNTER — Other Ambulatory Visit: Payer: Self-pay

## 2020-06-15 ENCOUNTER — Ambulatory Visit (INDEPENDENT_AMBULATORY_CARE_PROVIDER_SITE_OTHER): Payer: 59 | Admitting: Family Medicine

## 2020-06-15 ENCOUNTER — Encounter: Payer: Self-pay | Admitting: Family Medicine

## 2020-06-15 VITALS — BP 94/60 | HR 61 | Temp 98.0°F | Resp 16 | Ht 73.0 in | Wt 170.2 lb

## 2020-06-15 DIAGNOSIS — F419 Anxiety disorder, unspecified: Secondary | ICD-10-CM

## 2020-06-15 DIAGNOSIS — R454 Irritability and anger: Secondary | ICD-10-CM

## 2020-06-15 MED ORDER — CLONAZEPAM 0.5 MG PO TABS
ORAL_TABLET | ORAL | 2 refills | Status: DC
Start: 2020-06-15 — End: 2021-08-28

## 2020-06-15 NOTE — Progress Notes (Signed)
OFFICE VISIT  06/15/2020  CC:  Chief Complaint  Patient presents with  . Follow-up    Anxiety/mood    HPI:    Patient is a 59 y.o. Caucasian male who presents for 3 mo f/u irritability and anger difficulties, anxiety, and chronic depression. A/P as of last visit: "Chronic anxiety and depression that manifests primarily as irritability and anger. Mild improvement since back on fluox 20mg  x 5 wks now.  Some mild GI side effects that he is willing to tolerate and ride out for a while.  Will give 20mg  qd dose at least 6 more weeks and he'll call and let me know if he feels ready to go up to 40mg  qd dose or not.  Today I sent in 20 mg fluox caps to take 1 qd, #30, RF x 2---instructions to fill when pt requests. No labs needed."  INTERIM HX: Says he is about the same.   Fluox not really helping anything, also notes return of abd cramping and bloating for 1-2d at a time (no diarrhea or n/v) since getting on this med again.  Little things irritate him (trying to blow leaves recently, mad b/c wind blowing and interfering, throws inanimate objects), sometimes short/snappy with people, leads to sour mood/remorse that often lingers for a day or more.  No sadness, hopelessness, excessive guilt, or anhedonia. He has some periods that he identifies as more of anxiety/worry, mind won't shut down, occ takes a clonaz for this at night and sleeps well.  Has taken clonaz in daytime occ and finds it helpful some and has no adverse side effects.   No panic attacks.  No impulsivity, no  Still having nagging vision probs from chronic eye dz--followed closely by ophthal.  PMP AWARE reviewed today: most recent rx for vicodon 5/325 was filled 05/28/20, # 30, rx by me. Most recent clonaz rx filled 08/30/19, #30, rx by me. No red flags.  Past Medical History:  Diagnosis Date  . Anxiety   . BPH (benign prostatic hypertrophy)   . Cataract    artifiicial lenses in both eyes  . Cervical spondylosis    with  radiculopathy not responsive to conservative therapies:  [facility]: ACDF surgery C5-C7 performed 04/05/15  . Chronic pain syndrome    cervicalgia, heredetary PN, LBP  . Difficulty controlling anger    + GAD with OCD tendencies + memory changes---consider neuropsych testing  . Dysplastic nevus 10/2015   left mid back: Dr. Denna Haggard following  . Glaucoma   . History of adenomatous polyp of colon   . IBS (irritable bowel syndrome)    +constipation  . Lateral epicondylitis of left elbow   . Lymphocytic colitis 05/2019, 2021   Colonoscopy bx +  . Microscopic colitis 06/2019   colonoscopy + 06/2019  . Neuropathic pain   . Peripheral neuropathy    Idiopathic, inherited per pt's neurologist, Dr. Everette Rank. Various labs normal 07/2016 except vitamin B6 was siginificantly elevated.  Allergist eval/allergy testing (including food) ALL NORMAL/NEG 07/2016.  Marland Kitchen Restless legs syndrome 09/27/2014  . Throat irritation    chronic (since cervical spine surgery 2016).  Dr. Erik Obey [id number] normal, CT sinuses normal, barium swallow normal, trial of protonix no help.     Past Surgical History:  Procedure Laterality Date  . Ant cerv decompression/discectomy w/fusion C 5-7  04/05/15   C5-7 ACDF  . CATARACT EXTRACTION Bilateral   . COLONOSCOPY  06/29/2019   FOR EVAL OF UNEXPLAINED DIARRHEA-->normal, bx c/w microscopic colitis->uceris and  budesonide rx'd by Dr. Silverio Decamp 06/2019  . COLONOSCOPY W/ POLYPECTOMY  08/2012; 05/23/16   2017, adenomatous polyp--recall 5 yrs.  . TRABECULECTOMY Bilateral    for uncontrolled glaucoma   . TRANSURETHRAL RESECTION OF PROSTATE  2008   due to enlarged prostate  . VASECTOMY  2001    Outpatient Medications Prior to Visit  Medication Sig Dispense Refill  . Alpha-Lipoic Acid 300 MG CAPS Take 1 capsule by mouth 2 (two) times daily.    . AMBULATORY NON FORMULARY MEDICATION Medication Name: CBD Oil- Take 15mg  qhs    . gabapentin (NEURONTIN) 600 MG  tablet TAKE ONE AND ONE-HALF TABLETS IN THE MORNING AND ONE AND ONE-HALF TABLETS IN THE EVENING    . HYDROcodone-acetaminophen (NORCO/VICODIN) 5-325 MG tablet Take 1-2 tablets by mouth every 6 (six) hours as needed for moderate pain. 30 tablet 0  . Magnesium 300 MG CAPS Take by mouth as needed.     . Omega-3 Fatty Acids (OMEGA-3 FISH OIL) 300 MG CAPS Take 1 capsule by mouth daily.    . polyethylene glycol powder (GLYCOLAX/MIRALAX) 17 GM/SCOOP powder Take by mouth.    . clonazePAM (KLONOPIN) 0.5 MG tablet 1-2 tabs po bid prn IF YOU HAVE INCREASED ANXIETY 30 tablet 1  . FLUoxetine (PROZAC) 20 MG capsule Take 1 capsule (20 mg total) by mouth daily. 30 capsule 2  . gabapentin (NEURONTIN) 300 MG capsule Take 900 mg by mouth 2 (two) times daily. (Patient not taking: Reported on 06/15/2020)     No facility-administered medications prior to visit.    No Known Allergies  ROS As per HPI  PE: Vitals with BMI 06/15/2020 03/17/2020 02/03/2020  Height 6\' 1"  6\' 1"  6\' 1"   Weight 170 lbs 3 oz 166 lbs 4 oz 166 lbs 13 oz  BMI 22.46 74.25 95.63  Systolic 94 875 643  Diastolic 60 71 68  Pulse 61 54 59     Gen: Alert, well appearing.  Patient is oriented to person, place, time, and situation. AFFECT: pleasant, lucid thought and speech. No further exam today.  LABS:  none  IMPRESSION AND PLAN:  Irritability and anger, overall mild anxiety and no depression at this point in time. Fluoxetine not helpful, plus this med may be causing GI side effects. D/C fluox. Start taking clonaz 0.5mg , 1-2 bid scheduled.  An After Visit Summary was printed and given to the patient.  FOLLOW UP: Return in about 2 months (around 08/16/2020) for f/u anx med. CPE due at any time now--we'll line this up when anger/anx issue a bit more straightened out.  Signed:  Crissie Sickles, MD           06/15/2020

## 2020-06-22 ENCOUNTER — Encounter: Payer: Self-pay | Admitting: Family Medicine

## 2020-07-12 ENCOUNTER — Encounter: Payer: Self-pay | Admitting: Family Medicine

## 2020-07-12 DIAGNOSIS — Z9889 Other specified postprocedural states: Secondary | ICD-10-CM

## 2020-07-12 DIAGNOSIS — H269 Unspecified cataract: Secondary | ICD-10-CM

## 2020-07-12 DIAGNOSIS — Z9849 Cataract extraction status, unspecified eye: Secondary | ICD-10-CM

## 2020-07-12 DIAGNOSIS — H409 Unspecified glaucoma: Secondary | ICD-10-CM

## 2020-07-12 MED ORDER — SERTRALINE HCL 50 MG PO TABS: 50.0000 mg | ORAL_TABLET | Freq: Every day | ORAL | 3 refills | Status: DC

## 2020-07-12 NOTE — Telephone Encounter (Signed)
OK, referral ordered as per pt request. 

## 2020-07-12 NOTE — Telephone Encounter (Signed)
OK, lets try sertraline. I sent in rx for 50mg  tabs, take one per day. Keep f/u set for 08/15/20.

## 2020-08-14 ENCOUNTER — Other Ambulatory Visit: Payer: Self-pay

## 2020-08-14 NOTE — Progress Notes (Signed)
OFFICE VISIT  08/15/2020  CC:  Chief Complaint  Patient presents with  . Follow-up    Anxiety    HPI:    Patient is a 60 y.o. Caucasian male who presents for 2 mo f/u irritability and anger difficulties, anxiety, and chronic depression. A/P as of last visit: "Irritability and anger, overall mild anxiety and no depression at this point in time. Fluoxetine not helpful, plus this med may be causing GI side effects. D/C fluox. Start taking clonaz 0.5mg , 1-2 bid scheduled."  INTERIM HX: Taking clonaz scheduled did not work out so we decided on trial of low dose sertraline, started this about 3 wks ago. Says he thinks it is helping some, happy with current dose (50mg  qhs). Not as quick to anger, a little less impulsivity.  No feelings of dep, no panic, no long periods of lots of anxiety.  Has gone back to taking the clonaz only on prn/infrequent basis.  Has some mild stomach crampy sx's that he is NOT sure is coming from this med, not every night.  No loose stools.  No fevers, no n/v.    Overall he's dealing with everything that's going on with his health right now pretty well---esp considering it is eye/glaucoma issues requiring frequent surgeries.  Then there is his chronic neuropathic pain issues.  Says no acute complaint today that I would be able to help with .    Past Medical History:  Diagnosis Date  . Anxiety   . BPH (benign prostatic hypertrophy)   . Cataract    artifiicial lenses in both eyes  . Cervical spondylosis    with radiculopathy not responsive to conservative therapies:  [facility]: ACDF surgery C5-C7 performed 04/05/15  . Chronic pain syndrome    cervicalgia, heredetary PN, LBP  . Difficulty controlling anger    + GAD with OCD tendencies + memory changes---consider neuropsych testing  . Dysplastic nevus 10/2015   left mid back: Dr. Denna Haggard following  . Glaucoma   . History of adenomatous polyp of colon   . IBS (irritable bowel  syndrome)    +constipation  . Microscopic colitis 06/2019   colonoscopy + 06/2019  . Neuropathic pain   . Peripheral neuropathy    Idiopathic, inherited per pt's neurologist, Dr. Everette Rank. Various labs normal 07/2016 except vitamin B6 was siginificantly elevated.  Allergist eval/allergy testing (including food) ALL NORMAL/NEG 07/2016.  Marland Kitchen Restless legs syndrome 09/27/2014  . Throat irritation    chronic (since cervical spine surgery 2016).  Dr. Erik Obey [id number] normal, CT sinuses normal, barium swallow normal, trial of protonix no help.     Past Surgical History:  Procedure Laterality Date  . Ant cerv decompression/discectomy w/fusion C 5-7  04/05/15   C5-7 ACDF  . CATARACT EXTRACTION Bilateral   . COLONOSCOPY  06/29/2019   FOR EVAL OF UNEXPLAINED DIARRHEA-->normal, bx c/w microscopic colitis->uceris and budesonide rx'd by Dr. Silverio Decamp 06/2019  . COLONOSCOPY W/ POLYPECTOMY  08/2012; 05/23/16   2017, adenomatous polyp--recall 5 yrs.  . TRABECULECTOMY Bilateral    for uncontrolled glaucoma   . TRANSURETHRAL RESECTION OF PROSTATE  2008   due to enlarged prostate  . VASECTOMY  2001    Outpatient Medications Prior to Visit  Medication Sig Dispense Refill  . Alpha-Lipoic Acid 300 MG CAPS Take 1 capsule by mouth 2 (two) times daily.    . AMBULATORY NON FORMULARY MEDICATION Medication Name: CBD Oil- Take 15mg  qhs    . bimatoprost (LUMIGAN) 0.03 % ophthalmic solution Place  1 drop into the right eye at bedtime.    . clonazePAM (KLONOPIN) 0.5 MG tablet 1-2 tabs po bid 120 tablet 2  . gabapentin (NEURONTIN) 600 MG tablet TAKE ONE AND ONE-HALF TABLETS IN THE MORNING AND ONE AND ONE-HALF TABLETS IN THE EVENING    . HYDROcodone-acetaminophen (NORCO/VICODIN) 5-325 MG tablet Take 1-2 tablets by mouth every 6 (six) hours as needed for moderate pain. 30 tablet 0  . Magnesium 300 MG CAPS Take by mouth as needed.     . Omega-3 Fatty Acids (OMEGA-3 FISH OIL) 300 MG CAPS Take 1 capsule by mouth daily.     . polyethylene glycol powder (GLYCOLAX/MIRALAX) 17 GM/SCOOP powder Take by mouth.    . sertraline (ZOLOFT) 50 MG tablet Take 1 tablet (50 mg total) by mouth daily. 30 tablet 3  . timolol (TIMOPTIC) 0.5 % ophthalmic solution SMARTSIG:In Eye(s)     No facility-administered medications prior to visit.    No Known Allergies  ROS As per HPI  PE: Vitals with BMI 08/15/2020 06/15/2020 03/17/2020  Height 6\' 1"  6\' 1"  6\' 1"   Weight 172 lbs 10 oz 170 lbs 3 oz 166 lbs 4 oz  BMI 22.78 18.29 93.71  Systolic 96 94 696  Diastolic 60 60 71  Pulse 60 61 54   Gen: Alert, well appearing.  Patient is oriented to person, place, time, and situation. AFFECT: pleasant, lucid thought and speech. No further exam today.  LABS:    Chemistry      Component Value Date/Time   NA 141 08/30/2019 1022   K 4.6 08/30/2019 1022   CL 104 08/30/2019 1022   CO2 27 08/30/2019 1022   BUN 14 08/30/2019 1022   CREATININE 1.15 08/30/2019 1022      Component Value Date/Time   CALCIUM 9.6 08/30/2019 1022   ALKPHOS 64 12/12/2016 0846   AST 18 08/30/2019 1022   ALT 16 08/30/2019 1022   BILITOT 0.7 08/30/2019 1022     Lab Results  Component Value Date   WBC 6.1 08/30/2019   HGB 16.0 08/30/2019   HCT 47.2 08/30/2019   MCV 90.4 08/30/2019   PLT 196 08/30/2019   Lab Results  Component Value Date   CHOL 173 08/30/2019   HDL 77 08/30/2019   LDLCALC 79 08/30/2019   TRIG 87 08/30/2019   CHOLHDL 2.2 08/30/2019   Lab Results  Component Value Date   TSH 2.26 08/30/2019   IMPRESSION AND PLAN:  Difficulty controlling anger. Some anxiety but doesn't seem to be the big issue.  No "depression" per se, but I think he admits sometimes he is not always a "happy" person. Seems to be improving so far on 3wks of sertraline 50mg  qhs and we'll continue this for at least 6 more weeks before considering change/increase.   Fluox seemed to be of some help in the past but caused excessive GI symptoms. Cont clonaz prn---he  uses infrequently.  He'll return in 6 wks for cpe and f/u of this problem.  An After Visit Summary was printed and given to the patient.  FOLLOW UP: Return in about 6 weeks (around 09/26/2020) for annual CPE (fasting) + f/u anx med.  Signed:  Crissie Sickles, MD           08/15/2020

## 2020-08-15 ENCOUNTER — Ambulatory Visit (INDEPENDENT_AMBULATORY_CARE_PROVIDER_SITE_OTHER): Payer: 59 | Admitting: Family Medicine

## 2020-08-15 ENCOUNTER — Other Ambulatory Visit: Payer: Self-pay

## 2020-08-15 ENCOUNTER — Encounter: Payer: Self-pay | Admitting: Family Medicine

## 2020-08-15 VITALS — BP 96/60 | HR 60 | Temp 98.1°F | Resp 16 | Ht 73.0 in | Wt 172.6 lb

## 2020-08-15 DIAGNOSIS — R454 Irritability and anger: Secondary | ICD-10-CM | POA: Diagnosis not present

## 2020-09-06 HISTORY — PX: EYE SURGERY: SHX253

## 2020-09-26 NOTE — Progress Notes (Signed)
OFFICE VISIT  09/27/2020  CC:  Chief Complaint  Patient presents with  . Annual Exam    Fasting    HPI:    Patient is a 60 y.o. Caucasian male who presents for annual health maintenance exam and 6 wk f/u anxiety/anger control difficulty. A/P as of last visit: "Difficulty controlling anger. Some anxiety but doesn't seem to be the big issue.  No "depression" per se, but I think he admits sometimes he is not always a "happy" person. Seems to be improving so far on 3wks of sertraline 50mg  qhs and we'll continue this for at least 6 more weeks before considering change/increase.   Fluox seemed to be of some help in the past but caused excessive GI symptoms. Cont clonaz prn---he uses infrequently."  INTERIM HX: Impatience and irritability returning to bothersome levels despite still taking sertraline 50mg  qd. Hurt lower right back recently again--3 wks, bothering him and aggravating him but not "laying me up".  No radiation.  Intermittent pain right in front of L ear last few weeks, onset after opening mouth to take a bite of food. Sometimes feels like teeth not aligned well---like the TMJ may be spasming some.  Hx of chronic neck and low back pain as well as peripheral neuropathic pain.  I have been rx'ing him low dose opioids long term and he has consistently used these in small amounts responsibly.  PMP AWARE reviewed today: most recent rx for clonaz 0.5mg  was filled 06/15/20, # 120, rx by me. Most recent vicodin 5/325 rx filled 05/28/20, #30, rx by me. No red flags.   Past Medical History:  Diagnosis Date  . Anxiety   . BPH (benign prostatic hypertrophy)   . Cataract    artifiicial lenses in both eyes  . Cervical spondylosis    with radiculopathy not responsive to conservative therapies:  [facility]: ACDF surgery C5-C7 performed 04/05/15  . Chronic pain syndrome    cervicalgia, heredetary PN, LBP  . Difficulty controlling anger    + GAD with OCD  tendencies + memory changes---consider neuropsych testing  . Dysplastic nevus 10/2015   left mid back: Dr. Denna Haggard following  . Glaucoma   . History of adenomatous polyp of colon   . IBS (irritable bowel syndrome)    +constipation  . Microscopic colitis 06/2019   colonoscopy + 06/2019  . Neuropathic pain   . Peripheral neuropathy    Idiopathic, inherited per pt's neurologist, Dr. Everette Rank. Various labs normal 07/2016 except vitamin B6 was siginificantly elevated.  Allergist eval/allergy testing (including food) ALL NORMAL/NEG 07/2016.  Marland Kitchen Restless legs syndrome 09/27/2014  . Throat irritation    chronic (since cervical spine surgery 2016).  Dr. Erik Obey [id number] normal, CT sinuses normal, barium swallow normal, trial of protonix no help.     Past Surgical History:  Procedure Laterality Date  . Ant cerv decompression/discectomy w/fusion C 5-7  04/05/15   C5-7 ACDF  . CATARACT EXTRACTION Bilateral   . COLONOSCOPY  06/29/2019   FOR EVAL OF UNEXPLAINED DIARRHEA-->normal, bx c/w microscopic colitis->uceris and budesonide rx'd by Dr. Silverio Decamp 06/2019  . COLONOSCOPY W/ POLYPECTOMY  08/2012; 05/23/16   2017, adenomatous polyp--recall 5 yrs.  Marland Kitchen EYE SURGERY Right 09/06/2020  . TRABECULECTOMY Bilateral    for uncontrolled glaucoma   . TRANSURETHRAL RESECTION OF PROSTATE  2008   due to enlarged prostate  . VASECTOMY  2001   Social History   Socioeconomic History  . Marital status: Married    Spouse name:  Not on file  . Number of children: 2  . Years of education: Master D.  . Highest education level: Not on file  Occupational History  . Occupation: Immunologist: Building surveyor FOR SELF EMPLOYED    Comment: [employer]  . Occupation: handyman  Tobacco Use  . Smoking status: Former Smoker    Packs/day: 0.75    Years: 15.00    Pack years: 11.25    Types: Cigarettes    Quit date: 06/24/1998    Years since quitting: 22.2  . Smokeless tobacco: Never Used   . Tobacco comment: 2 pack per week  Substance and Sexual Activity  . Alcohol use: Yes    Alcohol/week: 12.0 standard drinks    Types: 12 Cans of beer per week    Comment: Patient drinks 12 beers a week  . Drug use: No  . Sexual activity: Not on file  Other Topics Concern  . Not on file  Social History Narrative   Married.   Educ: MBA   Occupation: Personal assistant   No tob, occ alc, no drugs.   Exercises regularly.   Patient is right handed.   Enjoys fishing and woodworking.   Social Determinants of Health   Financial Resource Strain: Not on file  Food Insecurity: Not on file  Transportation Needs: Not on file  Physical Activity: Not on file  Stress: Not on file  Social Connections: Not on file   Family History  Problem Relation Age of Onset  . Glaucoma Father   . Heart disease Father   . Neuropathy Father        Peripheral neuropathy  . Arthritis Father   . Parkinson's disease Father   . Colon cancer Neg Hx   . Rectal cancer Neg Hx     Outpatient Medications Prior to Visit  Medication Sig Dispense Refill  . Alpha-Lipoic Acid 300 MG CAPS Take 1 capsule by mouth 2 (two) times daily.    . AMBULATORY NON FORMULARY MEDICATION Medication Name: CBD Oil- Take 15mg  qhs    . clonazePAM (KLONOPIN) 0.5 MG tablet 1-2 tabs po bid 120 tablet 2  . gabapentin (NEURONTIN) 600 MG tablet TAKE ONE AND ONE-HALF TABLETS IN THE MORNING AND ONE AND ONE-HALF TABLETS IN THE EVENING    . HYDROcodone-acetaminophen (NORCO/VICODIN) 5-325 MG tablet Take 1-2 tablets by mouth every 6 (six) hours as needed for moderate pain. 30 tablet 0  . Magnesium 300 MG CAPS Take by mouth as needed.     . Omega-3 Fatty Acids (OMEGA-3 FISH OIL) 300 MG CAPS Take 1 capsule by mouth daily.    . polyethylene glycol powder (GLYCOLAX/MIRALAX) 17 GM/SCOOP powder Take by mouth.    . sertraline (ZOLOFT) 50 MG tablet Take 1 tablet (50 mg total) by mouth daily. 30 tablet 3  . timolol (TIMOPTIC) 0.5 % ophthalmic  solution SMARTSIG:In Eye(s)    . bimatoprost (LUMIGAN) 0.03 % ophthalmic solution Place 1 drop into the right eye at bedtime. (Patient not taking: Reported on 09/27/2020)     No facility-administered medications prior to visit.    No Known Allergies  ROS Review of Systems  Constitutional: Negative for appetite change, chills, fatigue and fever.  HENT: Negative for congestion, dental problem, ear pain and sore throat.   Eyes: Negative for discharge, redness and visual disturbance.  Respiratory: Negative for cough, chest tightness, shortness of breath and wheezing.   Cardiovascular: Negative for chest pain, palpitations and leg swelling.  Gastrointestinal: Negative for abdominal pain, blood in stool, diarrhea, nausea and vomiting.  Genitourinary: Negative for difficulty urinating, dysuria, flank pain, frequency, hematuria and urgency.  Musculoskeletal: Positive for arthralgias (left jaw intermittently, see hpi) and back pain. Negative for joint swelling, myalgias and neck stiffness.  Skin: Negative for pallor and rash.  Neurological: Negative for dizziness, speech difficulty, weakness and headaches.  Hematological: Negative for adenopathy. Does not bruise/bleed easily.  Psychiatric/Behavioral: Negative for confusion and sleep disturbance. The patient is nervous/anxious.    PE: Vitals with BMI 09/27/2020 08/15/2020 06/15/2020  Height 5' 11.75" 6\' 1"  6\' 1"   Weight 169 lbs 172 lbs 10 oz 170 lbs 3 oz  BMI 23.09 03.21 22.48  Systolic 250 96 94  Diastolic 70 60 60  Pulse 55 60 61     Gen: Alert, well appearing.  Patient is oriented to person, place, time, and situation. AFFECT: pleasant, lucid thought and speech. ENT: Ears: EACs clear, normal epithelium.  TMs with good light reflex and landmarks bilaterally.  Eyes: no injection, icteris, swelling, or exudate.  EOMI, PERRLA. TMJ joints w/out tenderness or laxity/popping Nose: no drainage or turbinate edema/swelling.  No injection or focal  lesion.  Mouth: lips without lesion/swelling.  Oral mucosa pink and moist.  Dentition intact and without obvious caries or gingival swelling.  Oropharynx without erythema, exudate, or swelling.  Neck: supple/nontender.  No LAD, mass, or TM.  Carotid pulses 2+ bilaterally, without bruits. CV: RRR, no m/r/g.   LUNGS: CTA bilat, nonlabored resps, good aeration in all lung fields. ABD: soft, NT, ND, BS normal.  No hepatospenomegaly or mass.  No bruits. EXT: no clubbing, cyanosis, or edema.  Musculoskeletal: no joint swelling, erythema, warmth, or tenderness.  ROM of all joints intact. Skin - no sores or suspicious lesions or rashes or color changes   LABS:  Lab Results  Component Value Date   TSH 2.26 08/30/2019   Lab Results  Component Value Date   WBC 6.1 08/30/2019   HGB 16.0 08/30/2019   HCT 47.2 08/30/2019   MCV 90.4 08/30/2019   PLT 196 08/30/2019   Lab Results  Component Value Date   CREATININE 1.15 08/30/2019   BUN 14 08/30/2019   NA 141 08/30/2019   K 4.6 08/30/2019   CL 104 08/30/2019   CO2 27 08/30/2019   Lab Results  Component Value Date   ALT 16 08/30/2019   AST 18 08/30/2019   ALKPHOS 64 12/12/2016   BILITOT 0.7 08/30/2019   Lab Results  Component Value Date   CHOL 173 08/30/2019   Lab Results  Component Value Date   HDL 77 08/30/2019   Lab Results  Component Value Date   LDLCALC 79 08/30/2019   Lab Results  Component Value Date   TRIG 87 08/30/2019   Lab Results  Component Value Date   CHOLHDL 2.2 08/30/2019   Lab Results  Component Value Date   PSA 1.2 08/30/2019   PSA 1.3 12/15/2017   PSA 1.1 12/12/2016    IMPRESSION AND PLAN:  1) GAD/Irritability/anger control problem: not well controlled but got a little initial imp with sertraline 50mg  qd and tolerating this med well so I'll try inc to 100mg  qd.  2) L TMJ strain: reassured. Exam normal today. Isometric jaw exercises discussed.  3) Chronic pain syndrome: RF'd vicodin 5/324, 1-2  q6h prn, #30 rx'd today. CSC updated.  4) Health maintenance exam: Reviewed age and gender appropriate health maintenance issues (prudent diet, regular exercise, health risks of tobacco  and excessive alcohol, use of seatbelts, fire alarms in home, use of sunscreen).  Also reviewed age and gender appropriate health screening as well as vaccine recommendations. Vaccines: ALL UTD. Labs: fasting HP labs + PSA. Prostate ca screening: PSA ordered. Colon ca screening: he got colonoscopy 06/2019 for diarrhea; dx'd with lymphocytic colitis, no polyps.  Recall 10 yrs.  An After Visit Summary was printed and given to the patient.  FOLLOW UP: No follow-ups on file.  Signed:  Crissie Sickles, MD           09/27/2020

## 2020-09-27 ENCOUNTER — Other Ambulatory Visit: Payer: Self-pay

## 2020-09-27 ENCOUNTER — Encounter: Payer: Self-pay | Admitting: Family Medicine

## 2020-09-27 ENCOUNTER — Ambulatory Visit (INDEPENDENT_AMBULATORY_CARE_PROVIDER_SITE_OTHER): Payer: 59 | Admitting: Family Medicine

## 2020-09-27 VITALS — BP 115/70 | HR 55 | Temp 97.8°F | Resp 16 | Ht 71.75 in | Wt 169.0 lb

## 2020-09-27 DIAGNOSIS — G894 Chronic pain syndrome: Secondary | ICD-10-CM

## 2020-09-27 DIAGNOSIS — Z125 Encounter for screening for malignant neoplasm of prostate: Secondary | ICD-10-CM | POA: Diagnosis not present

## 2020-09-27 DIAGNOSIS — R454 Irritability and anger: Secondary | ICD-10-CM

## 2020-09-27 DIAGNOSIS — Z0000 Encounter for general adult medical examination without abnormal findings: Secondary | ICD-10-CM

## 2020-09-27 DIAGNOSIS — F411 Generalized anxiety disorder: Secondary | ICD-10-CM | POA: Diagnosis not present

## 2020-09-27 DIAGNOSIS — M26622 Arthralgia of left temporomandibular joint: Secondary | ICD-10-CM

## 2020-09-27 MED ORDER — SERTRALINE HCL 100 MG PO TABS: 100.0000 mg | ORAL_TABLET | Freq: Every day | ORAL | 1 refills | Status: DC

## 2020-09-27 MED ORDER — HYDROCODONE-ACETAMINOPHEN 5-325 MG PO TABS: 1.0000 | ORAL_TABLET | Freq: Four times a day (QID) | ORAL | 0 refills | Status: DC | PRN

## 2020-09-27 NOTE — Patient Instructions (Signed)

## 2020-09-28 LAB — COMPREHENSIVE METABOLIC PANEL
AG Ratio: 1.9 (calc) (ref 1.0–2.5)
ALT: 17 U/L (ref 9–46)
AST: 17 U/L (ref 10–35)
Albumin: 4.3 g/dL (ref 3.6–5.1)
Alkaline phosphatase (APISO): 60 U/L (ref 35–144)
BUN: 15 mg/dL (ref 7–25)
CO2: 28 mmol/L (ref 20–32)
Calcium: 9.5 mg/dL (ref 8.6–10.3)
Chloride: 103 mmol/L (ref 98–110)
Creat: 1.11 mg/dL (ref 0.70–1.33)
Globulin: 2.3 g/dL (calc) (ref 1.9–3.7)
Glucose, Bld: 81 mg/dL (ref 65–99)
Potassium: 4.5 mmol/L (ref 3.5–5.3)
Sodium: 140 mmol/L (ref 135–146)
Total Bilirubin: 0.9 mg/dL (ref 0.2–1.2)
Total Protein: 6.6 g/dL (ref 6.1–8.1)

## 2020-09-28 LAB — LIPID PANEL
Cholesterol: 190 mg/dL (ref <200)
HDL: 70 mg/dL (ref >=40)
LDL Cholesterol (Calc): 98 mg/dL (calc)
Non-HDL Cholesterol (Calc): 120 mg/dL (calc) (ref <130)
Total CHOL/HDL Ratio: 2.7 (calc) (ref <5.0)
Triglycerides: 127 mg/dL (ref <150)

## 2020-09-28 LAB — CBC WITH DIFFERENTIAL/PLATELET
Absolute Monocytes: 428 cells/uL (ref 200–950)
Basophils Absolute: 28 cells/uL (ref 0–200)
Basophils Relative: 0.6 %
Eosinophils Absolute: 150 cells/uL (ref 15–500)
Eosinophils Relative: 3.2 %
HCT: 47.6 % (ref 38.5–50.0)
Hemoglobin: 16 g/dL (ref 13.2–17.1)
Lymphs Abs: 1598 cells/uL (ref 850–3900)
MCH: 30.7 pg (ref 27.0–33.0)
MCHC: 33.6 g/dL (ref 32.0–36.0)
MCV: 91.4 fL (ref 80.0–100.0)
MPV: 10.6 fL (ref 7.5–12.5)
Monocytes Relative: 9.1 %
Neutro Abs: 2496 cells/uL (ref 1500–7800)
Neutrophils Relative %: 53.1 %
Platelets: 189 10*3/uL (ref 140–400)
RBC: 5.21 10*6/uL (ref 4.20–5.80)
RDW: 12.7 % (ref 11.0–15.0)
Total Lymphocyte: 34 %
WBC: 4.7 10*3/uL (ref 3.8–10.8)

## 2020-09-28 LAB — TSH: TSH: 2.37 mIU/L (ref 0.40–4.50)

## 2020-09-28 LAB — PSA: PSA: 0.99 ng/mL (ref <=4.0)

## 2020-10-11 ENCOUNTER — Encounter: Payer: Self-pay | Admitting: Family Medicine

## 2020-10-25 ENCOUNTER — Other Ambulatory Visit: Payer: Self-pay

## 2020-10-25 ENCOUNTER — Ambulatory Visit (INDEPENDENT_AMBULATORY_CARE_PROVIDER_SITE_OTHER): Payer: 59 | Admitting: Family Medicine

## 2020-10-25 ENCOUNTER — Encounter: Payer: Self-pay | Admitting: Family Medicine

## 2020-10-25 VITALS — BP 94/57 | HR 57 | Temp 98.1°F | Resp 16 | Ht 71.75 in | Wt 168.6 lb

## 2020-10-25 DIAGNOSIS — F419 Anxiety disorder, unspecified: Secondary | ICD-10-CM | POA: Diagnosis not present

## 2020-10-25 DIAGNOSIS — R454 Irritability and anger: Secondary | ICD-10-CM | POA: Diagnosis not present

## 2020-10-25 NOTE — Progress Notes (Signed)
OFFICE VISIT  10/25/2020  CC:  Chief Complaint  Patient presents with  . Follow-up    anxiety    HPI:    Patient is a 60 y.o. Caucasian male who presents for 4 wk f/u anxiety/irritability/anger control difficulty. A/P as of last visit: "1) GAD/Irritability/anger control problem: not well controlled but got a little initial imp with sertraline 50mg  qd and tolerating this med well so I'll try inc to 100mg  qd.  2) L TMJ strain: reassured. Exam normal today. Isometric jaw exercises discussed.  3) Chronic pain syndrome: RF'd vicodin 5/324, 1-2 q6h prn, #30 rx'd today. CSC updated.  4) Health maintenance exam: Reviewed age and gender appropriate health maintenance issues (prudent diet, regular exercise, health risks of tobacco and excessive alcohol, use of seatbelts, fire alarms in home, use of sunscreen).  Also reviewed age and gender appropriate health screening as well as vaccine recommendations. Vaccines: ALL UTD. Labs: fasting HP labs + PSA. Prostate ca screening: PSA ordered. Colon ca screening: he got colonoscopy 06/2019 for diarrhea; dx'd with lymphocytic colitis, no polyps.Recall 10 yrs."  INTERIM HX: All labs normal last visit.  Doesn't feel like increased dose of sertraline has helped more than the 50mg  daily dose yet. Slight sexual dysfunction that he attributes to the med but not as bad as from prozac, says he is willing to tolerate for now.  Still finds the clonaz useful/helpful as prn anxiety med--esp prior to flying and occ for anxiety-induced insomnia.   Denies depressed mood.  Appetite is fine, energy level normal.    Says his back is improving some lately, not on NSAIDs.  Rarely takes vicodin.   Past Medical History:  Diagnosis Date  . Anxiety   . BPH (benign prostatic hypertrophy)   . Cataract    artifiicial lenses in both eyes  . Cervical spondylosis    with radiculopathy not responsive to conservative therapies:  [facility] 02/2015:  ACDF surgery C5-C7 performed 04/05/15  . Chronic pain syndrome    cervicalgia, heredetary PN, LBP  . Difficulty controlling anger    + GAD with OCD tendencies + memory changes---consider neuropsych testing  . Dysplastic nevus 10/2015   left mid back: Dr. Denna Haggard following  . Glaucoma   . History of adenomatous polyp of colon   . IBS (irritable bowel syndrome)    +constipation  . Microscopic colitis 06/2019   colonoscopy + 06/2019  . Neuropathic pain   . Peripheral neuropathy    Idiopathic, inherited per pt's neurologist, Dr. Everette Rank. Various labs normal 07/2016 except vitamin B6 was siginificantly elevated.  Allergist eval/allergy testing (including food) ALL NORMAL/NEG 07/2016.  Marland Kitchen Restless legs syndrome 09/27/2014  . Throat irritation    chronic (since cervical spine surgery 2016).  Dr. Erik Obey [id number] normal, CT sinuses normal, barium swallow normal, trial of protonix no help.     Past Surgical History:  Procedure Laterality Date  . Ant cerv decompression/discectomy w/fusion C 5-7  04/05/15   C5-7 ACDF  . CATARACT EXTRACTION Bilateral   . COLONOSCOPY  06/29/2019   FOR EVAL OF UNEXPLAINED DIARRHEA-->normal, bx c/w microscopic colitis->uceris and budesonide rx'd by Dr. Silverio Decamp 06/2019  . COLONOSCOPY W/ POLYPECTOMY  08/2012; 05/23/16   2017, adenomatous polyp--recall 5 yrs.  Marland Kitchen EYE SURGERY Right 09/06/2020  . TRABECULECTOMY Bilateral    for uncontrolled glaucoma   . TRANSURETHRAL RESECTION OF PROSTATE  2008   due to enlarged prostate  . VASECTOMY  2001    Outpatient Medications Prior to Visit  Medication Sig Dispense Refill  . Alpha-Lipoic Acid 300 MG CAPS Take 1 capsule by mouth 2 (two) times daily.    . AMBULATORY NON FORMULARY MEDICATION Medication Name: CBD Oil- Take 15mg  qhs    . clonazePAM (KLONOPIN) 0.5 MG tablet 1-2 tabs po bid 120 tablet 2  . gabapentin (NEURONTIN) 600 MG tablet TAKE ONE AND ONE-HALF TABLETS IN THE MORNING AND ONE AND ONE-HALF TABLETS IN THE  EVENING    . HYDROcodone-acetaminophen (NORCO/VICODIN) 5-325 MG tablet Take 1-2 tablets by mouth every 6 (six) hours as needed for moderate pain. 30 tablet 0  . Magnesium 300 MG CAPS Take by mouth as needed.     . Omega-3 Fatty Acids (OMEGA-3 FISH OIL) 300 MG CAPS Take 1 capsule by mouth daily.    . polyethylene glycol powder (GLYCOLAX/MIRALAX) 17 GM/SCOOP powder Take by mouth.    . sertraline (ZOLOFT) 100 MG tablet Take 1 tablet (100 mg total) by mouth daily. 30 tablet 1  . timolol (TIMOPTIC) 0.5 % ophthalmic solution SMARTSIG:In Eye(s)     No facility-administered medications prior to visit.    No Known Allergies  ROS As per HPI  PE: Vitals with BMI 10/25/2020 09/27/2020 08/15/2020  Height 5' 11.75" 5' 11.75" 6\' 1"   Weight 168 lbs 10 oz 169 lbs 172 lbs 10 oz  BMI 23.04 67.12 45.80  Systolic 94 998 96  Diastolic 57 70 60  Pulse 57 55 60   Gen: Alert, well appearing.  Patient is oriented to person, place, time, and situation. AFFECT: pleasant, lucid thought and speech. No further exam today.  LABS:  Lab Results  Component Value Date   TSH 2.37 09/27/2020   Lab Results  Component Value Date   WBC 4.7 09/27/2020   HGB 16.0 09/27/2020   HCT 47.6 09/27/2020   MCV 91.4 09/27/2020   PLT 189 09/27/2020   Lab Results  Component Value Date   CREATININE 1.11 09/27/2020   BUN 15 09/27/2020   NA 140 09/27/2020   K 4.5 09/27/2020   CL 103 09/27/2020   CO2 28 09/27/2020   Lab Results  Component Value Date   ALT 17 09/27/2020   AST 17 09/27/2020   ALKPHOS 64 12/12/2016   BILITOT 0.9 09/27/2020   Lab Results  Component Value Date   CHOL 190 09/27/2020   Lab Results  Component Value Date   HDL 70 09/27/2020   Lab Results  Component Value Date   LDLCALC 98 09/27/2020   Lab Results  Component Value Date   TRIG 127 09/27/2020   Lab Results  Component Value Date   CHOLHDL 2.7 09/27/2020   Lab Results  Component Value Date   PSA 0.99 09/27/2020   PSA 1.2  08/30/2019   PSA 1.3 12/15/2017   IMPRESSION AND PLAN:  1) Anxiety, irritability, anger control problems: improvement on sertraline but not much change over the last month after inc from 50 to 100mg  qd. He is in favor of giving this more time. Cont clonaz 0.5mg , 1-2 bid prn.  An After Visit Summary was printed and given to the patient.  FOLLOW UP: No follow-ups on file.  Signed:  Crissie Sickles, MD           10/25/2020

## 2020-11-22 DIAGNOSIS — U071 COVID-19: Secondary | ICD-10-CM

## 2020-11-22 HISTORY — DX: COVID-19: U07.1

## 2020-11-23 ENCOUNTER — Encounter: Payer: Self-pay | Admitting: Family Medicine

## 2020-11-23 NOTE — Telephone Encounter (Signed)
FYI  Please see below

## 2020-11-24 ENCOUNTER — Other Ambulatory Visit: Payer: Self-pay | Admitting: Family Medicine

## 2020-12-05 DIAGNOSIS — H35351 Cystoid macular degeneration, right eye: Secondary | ICD-10-CM | POA: Insufficient documentation

## 2021-01-22 ENCOUNTER — Encounter: Payer: Self-pay | Admitting: Family Medicine

## 2021-01-22 NOTE — Telephone Encounter (Signed)
Yes ok to make new pt appt in any available appt slot.

## 2021-02-07 ENCOUNTER — Encounter: Payer: Self-pay | Admitting: Family Medicine

## 2021-02-07 ENCOUNTER — Other Ambulatory Visit: Payer: Self-pay

## 2021-02-07 ENCOUNTER — Ambulatory Visit (INDEPENDENT_AMBULATORY_CARE_PROVIDER_SITE_OTHER): Payer: 59 | Admitting: Family Medicine

## 2021-02-07 VITALS — BP 100/62 | HR 68 | Temp 98.3°F | Resp 16 | Ht 71.75 in | Wt 168.4 lb

## 2021-02-07 DIAGNOSIS — R454 Irritability and anger: Secondary | ICD-10-CM | POA: Diagnosis not present

## 2021-02-07 DIAGNOSIS — F419 Anxiety disorder, unspecified: Secondary | ICD-10-CM | POA: Diagnosis not present

## 2021-02-07 DIAGNOSIS — K146 Glossodynia: Secondary | ICD-10-CM

## 2021-02-07 DIAGNOSIS — R062 Wheezing: Secondary | ICD-10-CM | POA: Diagnosis not present

## 2021-02-07 MED ORDER — NYSTATIN 100000 UNIT/ML MT SUSP: 10.0000 mL | Freq: Three times a day (TID) | OROMUCOSAL | 0 refills | Status: DC

## 2021-02-07 MED ORDER — SERTRALINE HCL 50 MG PO TABS: 50.0000 mg | ORAL_TABLET | Freq: Every day | ORAL | 1 refills | Status: DC

## 2021-02-07 MED ORDER — SERTRALINE HCL 100 MG PO TABS: 100.0000 mg | ORAL_TABLET | Freq: Every day | ORAL | 1 refills | Status: DC

## 2021-02-07 NOTE — Progress Notes (Signed)
OFFICE VISIT  02/07/2021  CC:  Chief Complaint  Patient presents with   Follow-up    RCI   HPI:    Patient is a 60 y.o. Caucasian male who presents for 3 mo f/u anxiety and anger control difficulties, plus f/u chronic pain syndrome (high risk med use). A/P as of last visit: "1) Anxiety, irritability, anger control problems: improvement on sertraline but not much change over the last month after inc from 50 to '100mg'$  qd. He is in favor of giving this more time. Cont clonaz 0.'5mg'$ , 1-2 bid prn."  INTERIM HX: Doing fine. Doesn't note much diff since being on the '100mg'$  sertraline since I last saw him. Still taking clonaz prn inc anxiety or for help sleeping and finds it helpful.  Says tongue has bled in evenings lately, mild soreness on tongue.  He has a tongue scraper he uses daily (for 25+ yrs), good oral hygiene.  He also notes occ hearing a soft whistle/wheeze when breathing since having covid a couple months ago.  He has no cough, SOB, or chest tightness.  No CP.  Indication for chronic opioid: chronic neck and low back pain as well as peripheral neuropathic pain.  I have been rx'ing him low dose opioids long term and he has consistently used these in small amounts responsibly.  These improve his functioning and quality of life. Medication and dose: vicodin 5/325, 1-2 q6h prn. PMP AWARE reviewed today: most recent rx for vicodin was filled 09/27/20, # 70, rx by me. Most recent clonazepam rx filled 06/15/20, #120, rx by me. No red flags.   Past Medical History:  Diagnosis Date   Anxiety    BPH (benign prostatic hypertrophy)    Cataract    artifiicial lenses in both eyes   Cervical spondylosis    with radiculopathy not responsive to conservative therapies:  [facility] 02/2015: ACDF surgery C5-C7 performed 04/05/15   Chronic pain syndrome    cervicalgia, heredetary PN, LBP   COVID-19 virus infection 11/2020   Difficulty controlling anger    + GAD with OCD  tendencies + memory changes---consider neuropsych testing   Dysplastic nevus 10/2015   left mid back: Dr. Denna Haggard following   Glaucoma    History of adenomatous polyp of colon    IBS (irritable bowel syndrome)    +constipation   Microscopic colitis 06/2019   colonoscopy + 06/2019   Neuropathic pain    Peripheral neuropathy    Idiopathic, inherited per pt's neurologist, Dr. Everette Rank. Various labs normal 07/2016 except vitamin B6 was siginificantly elevated.  Allergist eval/allergy testing (including food) ALL NORMAL/NEG 07/2016.   Restless legs syndrome 09/27/2014   Throat irritation    chronic (since cervical spine surgery 2016).  Dr. Erik Obey [id number] normal, CT sinuses normal, barium swallow normal, trial of protonix no help.     Past Surgical History:  Procedure Laterality Date   Ant cerv decompression/discectomy w/fusion C 5-7  04/05/15   C5-7 ACDF   CATARACT EXTRACTION Bilateral    COLONOSCOPY  06/29/2019   FOR EVAL OF UNEXPLAINED DIARRHEA-->normal, bx c/w microscopic colitis->uceris and budesonide rx'd by Dr. Silverio Decamp 06/2019   COLONOSCOPY W/ POLYPECTOMY  08/2012; 05/23/16   2017, adenomatous polyp--recall 5 yrs.   EYE SURGERY Right 09/06/2020   TRABECULECTOMY Bilateral    for uncontrolled glaucoma    TRANSURETHRAL RESECTION OF PROSTATE  2008   due to enlarged prostate   VASECTOMY  2001    Outpatient Medications Prior to Visit  Medication Sig  Dispense Refill   Alpha-Lipoic Acid 300 MG CAPS Take 1 capsule by mouth 2 (two) times daily.     AMBULATORY NON FORMULARY MEDICATION Medication Name: CBD Oil- Take '15mg'$  qhs     clonazePAM (KLONOPIN) 0.5 MG tablet 1-2 tabs po bid 120 tablet 2   gabapentin (NEURONTIN) 600 MG tablet TAKE ONE AND ONE-HALF TABLETS IN THE MORNING AND ONE AND ONE-HALF TABLETS IN THE EVENING     HYDROcodone-acetaminophen (NORCO/VICODIN) 5-325 MG tablet Take 1-2 tablets by mouth every 6 (six) hours as needed for moderate pain. 30 tablet 0   Magnesium 300 MG  CAPS Take by mouth as needed.      Omega-3 Fatty Acids (OMEGA-3 FISH OIL) 300 MG CAPS Take 1 capsule by mouth daily.     polyethylene glycol powder (GLYCOLAX/MIRALAX) 17 GM/SCOOP powder Take by mouth.     timolol (TIMOPTIC) 0.5 % ophthalmic solution SMARTSIG:In Eye(s)     sertraline (ZOLOFT) 100 MG tablet TAKE 1 TABLET BY MOUTH EVERY DAY 30 tablet 1   No facility-administered medications prior to visit.    No Known Allergies  ROS As per HPI  PE: Vitals with BMI 02/07/2021 10/25/2020 09/27/2020  Height 5' 11.75" 5' 11.75" 5' 11.75"  Weight 168 lbs 6 oz 168 lbs 10 oz 169 lbs  BMI 23.01 [id number]  Systolic [id number]  Diastolic 62 57 70  Pulse 68 57 55    Gen: Alert, well appearing.  Patient is oriented to person, place, time, and situation. AFFECT: pleasant, lucid thought and speech. Mouth: no ulcers or other type of focal lesion, no erythema, no swelling.  Tongue a bit fissured in middle but wnl, no bleeding or irritation noted.  Gingiva and buccal mucosa normal.  Palate, dentition, and post pharynx unremarkable.  Mucosa moist. CV: RRR, no m/r/g.   LUNGS: CTA bilat, nonlabored resps, good aeration in all lung fields.   LABS:  Lab Results  Component Value Date   TSH 2.37 09/27/2020   Lab Results  Component Value Date   WBC 4.7 09/27/2020   HGB 16.0 09/27/2020   HCT 47.6 09/27/2020   MCV 91.4 09/27/2020   PLT 189 09/27/2020   Lab Results  Component Value Date   CREATININE 1.11 09/27/2020   BUN 15 09/27/2020   NA 140 09/27/2020   K 4.5 09/27/2020   CL 103 09/27/2020   CO2 28 09/27/2020   Lab Results  Component Value Date   ALT 17 09/27/2020   AST 17 09/27/2020   ALKPHOS 64 12/12/2016   BILITOT 0.9 09/27/2020   Lab Results  Component Value Date   CHOL 190 09/27/2020   Lab Results  Component Value Date   HDL 70 09/27/2020   Lab Results  Component Value Date   LDLCALC 98 09/27/2020   Lab Results  Component Value Date   TRIG 127 09/27/2020   Lab  Results  Component Value Date   CHOLHDL 2.7 09/27/2020   Lab Results  Component Value Date   PSA 0.99 09/27/2020   PSA 1.2 08/30/2019   PSA 1.3 12/15/2017   IMPRESSION AND PLAN:  1) Anxiety, ?mood disorder, +difficulty controlling anger: we've not found anything to help with this despite trials of many meds.  We'll increase sertraline to 150 mg qd dosing since he seems to be tolerating this med ok (? Abd cramping and gas occ for a day at a time, no diarrhea. Not clearly tied to intake of med or eating. He declines trial of antispasmodic  today.)  2) Tongue pain, bleeding: unknown etiology.  Exam normal today.  ?post-covid effect? Will do trial of magic mouthwash 31m tid for a while.  3) Wheezing: no features to suggest bronchospasm or infection. ? Upper airway. Reassured pt.  An After Visit Summary was printed and given to the patient.  FOLLOW UP: Return for 6-8 wks f/u anx/mood. Next cpe 09/2021  Signed:  PCrissie Sickles MD           02/07/2021

## 2021-04-04 ENCOUNTER — Ambulatory Visit (INDEPENDENT_AMBULATORY_CARE_PROVIDER_SITE_OTHER): Payer: 59 | Admitting: Family Medicine

## 2021-04-04 ENCOUNTER — Encounter: Payer: Self-pay | Admitting: Family Medicine

## 2021-04-04 ENCOUNTER — Other Ambulatory Visit: Payer: Self-pay

## 2021-04-04 VITALS — BP 93/60 | HR 56 | Temp 98.2°F | Ht 71.75 in | Wt 164.0 lb

## 2021-04-04 DIAGNOSIS — G894 Chronic pain syndrome: Secondary | ICD-10-CM | POA: Diagnosis not present

## 2021-04-04 DIAGNOSIS — R454 Irritability and anger: Secondary | ICD-10-CM

## 2021-04-04 DIAGNOSIS — Z79899 Other long term (current) drug therapy: Secondary | ICD-10-CM

## 2021-04-04 DIAGNOSIS — G8929 Other chronic pain: Secondary | ICD-10-CM

## 2021-04-04 DIAGNOSIS — F419 Anxiety disorder, unspecified: Secondary | ICD-10-CM

## 2021-04-04 DIAGNOSIS — M792 Neuralgia and neuritis, unspecified: Secondary | ICD-10-CM

## 2021-04-04 DIAGNOSIS — M542 Cervicalgia: Secondary | ICD-10-CM

## 2021-04-04 MED ORDER — HYDROCODONE-ACETAMINOPHEN 5-325 MG PO TABS: 1.0000 | ORAL_TABLET | Freq: Four times a day (QID) | ORAL | 0 refills | Status: DC | PRN

## 2021-04-04 MED ORDER — SERTRALINE HCL 100 MG PO TABS: ORAL_TABLET | ORAL | 3 refills | Status: DC

## 2021-04-04 NOTE — Progress Notes (Signed)
OFFICE VISIT  04/04/2021  CC:  Chief Complaint  Patient presents with   Follow-up    Anxiety, mood   HPI:    Patient is a 60 y.o. male who presents for 2 mo f/u anxiety/mood/difficulty controlling anger, also f/u chronic pain syndrome. A/P as of last visit: "1) Anxiety, ?mood disorder, +difficulty controlling anger: we've not found anything to help with this despite trials of many meds.  We'll increase sertraline to 150 mg qd dosing since he seems to be tolerating this med ok (? Abd cramping and gas occ for a day at a time, no diarrhea. Not clearly tied to intake of med or eating. He declines trial of antispasmodic today.   2) Tongue pain, bleeding: unknown etiology.  Exam normal today.  ?post-covid effect? Will do trial of magic mouthwash 64ml tid for a while.   3) Wheezing: no features to suggest bronchospasm or infection. ? Upper airway. Reassured pt."  INTERIM HX: Patient says he is doing a little better.  Less tendency to get very angry and when he does get angry he calms himself down a little bit easier.  No depressed mood.  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  He still occasionally uses clonazepam to help with sleep or also to help when a stressful event comes up such as having to fly/travel.    Indication for chronic opioid: chronic neck and low back pain as well as peripheral neuropathic pain.  I have been rx'ing him low dose opioids long term and he has consistently used these in small amounts responsibly.  These improve his functioning and quality of life. Only occasionally takes a dose of Vicodin.  Has taken some lately due to some neck pain at the end of the day after working on his rental home. Medication and dose: vicodin 5/325, 1-2 q6h prn. PMP AWARE reviewed today: most recent rx for vicodin was filled 09/27/20, # 72, rx by me. Most recent clonaz rx filled 06/15/20, #120, rx by  me. No red flags.  Past Medical History:  Diagnosis Date   Anxiety    BPH (benign prostatic hypertrophy)    Cataract    artifiicial lenses in both eyes   Cervical spondylosis    with radiculopathy not responsive to conservative therapies:  [facility]: ACDF surgery C5-C7 performed 04/05/15   Chronic pain syndrome    cervicalgia, heredetary PN, LBP   COVID-19 virus infection 11/2020   Difficulty controlling anger    + GAD with OCD tendencies + memory changes---consider neuropsych testing   Dysplastic nevus 10/2015   left mid back: Dr. Denna Haggard following   Glaucoma    History of adenomatous polyp of colon    IBS (irritable bowel syndrome)    +constipation   Microscopic colitis 06/2019   colonoscopy + 06/2019   Neuropathic pain    Peripheral neuropathy    Idiopathic, inherited per pt's neurologist, Dr. Everette Rank. Various labs normal 07/2016 except vitamin B6 was siginificantly elevated.  Allergist eval/allergy testing (including food) ALL NORMAL/NEG 07/2016.   Restless legs syndrome 09/27/2014   Throat irritation    chronic (since cervical spine surgery 2016).  Dr. Erik Obey [id number]->[id number] normal, CT sinuses normal, barium swallow normal, trial of protonix no help.     Past Surgical History:  Procedure Laterality Date   Ant cerv decompression/discectomy w/fusion C 5-7  04/05/15   C5-7 ACDF  CATARACT EXTRACTION Bilateral    COLONOSCOPY  06/29/2019   FOR EVAL OF UNEXPLAINED DIARRHEA-->normal, bx c/w microscopic colitis->uceris and budesonide rx'd by Dr. Silverio Decamp 06/2019   COLONOSCOPY W/ POLYPECTOMY  08/2012; 05/23/16   2017, adenomatous polyp--recall 5 yrs.   EYE SURGERY Right 09/06/2020   TRABECULECTOMY Bilateral    for uncontrolled glaucoma    TRANSURETHRAL RESECTION OF PROSTATE  2008   due to enlarged prostate   VASECTOMY  2001    Outpatient Medications Prior to Visit  Medication Sig Dispense Refill   Alpha-Lipoic Acid 300 MG CAPS Take 1 capsule by mouth 2  (two) times daily.     AMBULATORY NON FORMULARY MEDICATION Medication Name: CBD Oil- Take 15mg  qhs     clonazePAM (KLONOPIN) 0.5 MG tablet 1-2 tabs po bid 120 tablet 2   gabapentin (NEURONTIN) 600 MG tablet TAKE ONE AND ONE-HALF TABLETS IN THE MORNING AND ONE AND ONE-HALF TABLETS IN THE EVENING     Magnesium 300 MG CAPS Take by mouth as needed.      Omega-3 Fatty Acids (OMEGA-3 FISH OIL) 300 MG CAPS Take 1 capsule by mouth daily.     polyethylene glycol powder (GLYCOLAX/MIRALAX) 17 GM/SCOOP powder Take by mouth.     timolol (TIMOPTIC) 0.5 % ophthalmic solution SMARTSIG:In Eye(s)     HYDROcodone-acetaminophen (NORCO/VICODIN) 5-325 MG tablet Take 1-2 tablets by mouth every 6 (six) hours as needed for moderate pain. 30 tablet 0   sertraline (ZOLOFT) 100 MG tablet Take 1 tablet (100 mg total) by mouth daily. 30 tablet 1   sertraline (ZOLOFT) 50 MG tablet Take 1 tablet (50 mg total) by mouth daily. 30 tablet 1   magic mouthwash (nystatin, diphenhydrAMINE, alum & mag hydroxide) suspension mixture Swish and spit 10 mLs 3 (three) times daily. (Patient not taking: Reported on 04/04/2021) 240 mL 0   No facility-administered medications prior to visit.    No Known Allergies  ROS As per HPI  PE: Vitals with BMI 04/04/2021 02/07/2021 10/25/2020  Height 5' 11.75" 5' 11.75" 5' 11.75"  Weight 164 lbs 168 lbs 6 oz 168 lbs 10 oz  BMI 22.41 94.85 46.27  Systolic 93 035 94  Diastolic 60 62 57  Pulse 56 68 57   Gen: Alert, well appearing.  Patient is oriented to person, place, time, and situation. AFFECT: pleasant, lucid thought and speech. No further exam today.  LABS:    Chemistry      Component Value Date/Time   NA 140 09/27/2020 0932   K 4.5 09/27/2020 0932   CL 103 09/27/2020 0932   CO2 28 09/27/2020 0932   BUN 15 09/27/2020 0932   CREATININE 1.11 09/27/2020 0932      Component Value Date/Time   CALCIUM 9.5 09/27/2020 0932   ALKPHOS 64 12/12/2016 0846   AST 17 09/27/2020 0932   ALT 17  09/27/2020 0932   BILITOT 0.9 09/27/2020 0932     Lab Results  Component Value Date   WBC 4.7 09/27/2020   HGB 16.0 09/27/2020   HCT 47.6 09/27/2020   MCV 91.4 09/27/2020   PLT 189 09/27/2020   Lab Results  Component Value Date   TSH 2.37 09/27/2020   IMPRESSION AND PLAN:  #1: Difficulty controlling anger, anxiety.  Slightly improved with increase of sertraline to 150 mg daily.  Continue this dose and reevaluate in 3 months.  He will continue clonazepam on a as needed basis, which seems to be quite infrequent.  Controlled substance contract updated today.  2.:  Chronic pain syndrome.  This is controlled adequately with gabapentin 800 mg twice a day and Vicodin on as needed basis, which he uses quite infrequently. Prescription for Vicodin 5/325 1 tab every 6 hours as needed #30 today. Trolled substance contract updated.  An After Visit Summary was printed and given to the patient.  FOLLOW UP: Return in about 3 months (around 07/05/2021) for routine chronic illness f/u. Cpe 09/2021  Signed:  Crissie Sickles, MD           04/04/2021

## 2021-05-15 ENCOUNTER — Encounter: Payer: Self-pay | Admitting: Family Medicine

## 2021-05-15 MED ORDER — TADALAFIL 10 MG PO TABS: ORAL_TABLET | ORAL | 1 refills | Status: DC

## 2021-05-15 NOTE — Telephone Encounter (Signed)
Please review and advise.

## 2021-05-15 NOTE — Telephone Encounter (Signed)
Cialis eRx'd

## 2021-05-29 NOTE — Telephone Encounter (Signed)
Please review and advise. Med pending

## 2021-05-30 MED ORDER — SILDENAFIL CITRATE 20 MG PO TABS: ORAL_TABLET | ORAL | 11 refills | Status: DC

## 2021-06-12 DIAGNOSIS — H5021 Vertical strabismus, right eye: Secondary | ICD-10-CM | POA: Insufficient documentation

## 2021-07-05 ENCOUNTER — Ambulatory Visit (INDEPENDENT_AMBULATORY_CARE_PROVIDER_SITE_OTHER): Payer: 59 | Admitting: Family Medicine

## 2021-07-05 ENCOUNTER — Encounter: Payer: Self-pay | Admitting: Family Medicine

## 2021-07-05 ENCOUNTER — Other Ambulatory Visit: Payer: Self-pay

## 2021-07-05 VITALS — BP 102/65 | HR 61 | Temp 98.0°F | Ht 71.75 in | Wt 167.4 lb

## 2021-07-05 DIAGNOSIS — F419 Anxiety disorder, unspecified: Secondary | ICD-10-CM

## 2021-07-05 DIAGNOSIS — K140 Glossitis: Secondary | ICD-10-CM | POA: Diagnosis not present

## 2021-07-05 DIAGNOSIS — R682 Dry mouth, unspecified: Secondary | ICD-10-CM | POA: Diagnosis not present

## 2021-07-05 DIAGNOSIS — R454 Irritability and anger: Secondary | ICD-10-CM | POA: Diagnosis not present

## 2021-07-05 NOTE — Progress Notes (Signed)
OFFICE VISIT  07/05/2021  CC:  Chief Complaint  Patient presents with   Follow-up    RCI; 3 month   HPI:    Patient is a 61 y.o. male who presents for 3 mo f/u anxiety/mood/difficulty controlling anger, also f/u chronic pain syndrome. A/P as of last visit: "#1: Difficulty controlling anger, anxiety.  Slightly improved with increase of sertraline to 150 mg daily.  Continue this dose and reevaluate in 3 months.  He will continue clonazepam on a as needed basis, which seems to be quite infrequent.  Controlled substance contract updated today.  2.:  Chronic pain syndrome.  This is controlled adequately with gabapentin 800 mg twice a day and Vicodin on as needed basis, which he uses quite infrequently. Prescription for Vicodin 5/325 1 tab every 6 hours as needed #30 today. Controlled substance contract updated."  INTERIM HX: He does not feel like things have changed any since I last saw him. Has some usual mild irritability/anger episodes but sounds like nothing frequent. No persistent depressed mood. He does have some OCD tendencies but nothing intrusive.  There is things like his close attention to everything being in order, sticking to a very rigid routine.  No compulsions. He only takes clonazepam occasionally, almost always to help him sleep on anxious days.  His pain is controlled and has been not done any changes in his use of his gabapentin or hydrocodone.    Indication for chronic opioid: chronic neck and low back pain as well as peripheral neuropathic pain.  I have been rx'ing him low dose opioids long term and he has consistently used these in small amounts responsibly.  These improve his functioning and quality of life. Only occasionally takes a dose of Vicodin.  Has taken some lately due to some neck pain at the end of the day after working on his rental home. Medication and dose: vicodin 5/325, 1-2 q6h prn. PMP AWARE reviewed today: most recent rx for vicodin was filled  04/04/21, # 30, rx by me. Most recent rx for clonaz filled 06/15/21, #120, rx by me. No red flags.  Last several months has had some ongoing problems with his tongue hurting, waxing and waning pattern.  Some ulcers on the tongue intermittently.  Significantly dry mouth.  He does not think he bites his tongue.  Tried Biotene but no help. No new medication or medication changes coincident with the onset of this problem. Has some chronic diffuse blepharitis symptoms but no significant problem with dry eyes.  No myalgias, arthralgias, or fatigue.  Past Medical History:  Diagnosis Date   Anxiety    BPH (benign prostatic hypertrophy)    Cataract    artifiicial lenses in both eyes   Cervical spondylosis    with radiculopathy not responsive to conservative therapies:  [facility]: ACDF surgery C5-C7 performed 04/05/15   Chronic pain syndrome    cervicalgia, heredetary PN, LBP   COVID-19 virus infection 11/2020   Difficulty controlling anger    + GAD with OCD tendencies + memory changes---consider neuropsych testing   Dysplastic nevus 10/2015   left mid back: Dr. Denna Haggard following   Glaucoma    History of adenomatous polyp of colon    IBS (irritable bowel syndrome)    +constipation   Microscopic colitis 06/2019   colonoscopy + 06/2019   Neuropathic pain    Peripheral neuropathy    Idiopathic, inherited per pt's neurologist, Dr. Everette Rank. Various labs normal 07/2016 except vitamin B6 was siginificantly  elevated.  Allergist eval/allergy testing (including food) ALL NORMAL/NEG 07/2016.   Restless legs syndrome 09/27/2014   Throat irritation    chronic (since cervical spine surgery 2016).  Dr. Erik Obey [id number] normal, CT sinuses normal, barium swallow normal, trial of protonix no help.     Past Surgical History:  Procedure Laterality Date   Ant cerv decompression/discectomy w/fusion C 5-7  04/05/15   C5-7 ACDF   CATARACT EXTRACTION Bilateral    COLONOSCOPY   06/29/2019   FOR EVAL OF UNEXPLAINED DIARRHEA-->normal, bx c/w microscopic colitis->uceris and budesonide rx'd by Dr. Silverio Decamp 06/2019   COLONOSCOPY W/ POLYPECTOMY  08/2012; 05/23/16   2017, adenomatous polyp--recall 5 yrs.   EYE SURGERY Right 09/06/2020   TRABECULECTOMY Bilateral    for uncontrolled glaucoma    TRANSURETHRAL RESECTION OF PROSTATE  2008   due to enlarged prostate   VASECTOMY  2001    Outpatient Medications Prior to Visit  Medication Sig Dispense Refill   Alpha-Lipoic Acid 300 MG CAPS Take 1 capsule by mouth 2 (two) times daily.     AMBULATORY NON FORMULARY MEDICATION Medication Name: CBD Oil- Take 15mg  qhs     clonazePAM (KLONOPIN) 0.5 MG tablet 1-2 tabs po bid 120 tablet 2   gabapentin (NEURONTIN) 600 MG tablet TAKE ONE AND ONE-HALF TABLETS IN THE MORNING AND ONE AND ONE-HALF TABLETS IN THE EVENING     HYDROcodone-acetaminophen (NORCO/VICODIN) 5-325 MG tablet Take 1-2 tablets by mouth every 6 (six) hours as needed for moderate pain. 30 tablet 0   ketorolac (ACULAR) 0.5 % ophthalmic solution Place 1 drop into the right eye 2 (two) times daily.     Magnesium 300 MG CAPS Take by mouth as needed.      Omega-3 Fatty Acids (OMEGA-3 FISH OIL) 300 MG CAPS Take 1 capsule by mouth daily.     polyethylene glycol powder (GLYCOLAX/MIRALAX) 17 GM/SCOOP powder Take by mouth.     sertraline (ZOLOFT) 100 MG tablet 1.5 tabs po qd 135 tablet 3   sildenafil (REVATIO) 20 MG tablet 1-5 tabs po qd prn intercourse 30 tablet 11   timolol (TIMOPTIC) 0.5 % ophthalmic solution SMARTSIG:In Eye(s)     No facility-administered medications prior to visit.    No Known Allergies  ROS As per HPI  PE: Vitals with BMI 07/05/2021 04/04/2021 02/07/2021  Height 5' 11.75" 5' 11.75" 5' 11.75"  Weight 167 lbs 6 oz 164 lbs 168 lbs 6 oz  BMI 22.87 76.73 41.93  Systolic 790 93 240  Diastolic 65 60 62  Pulse 61 56 68     Physical Exam  Gen: Alert, well appearing.  Patient is oriented to person, place,  time, and situation. AFFECT: pleasant, lucid thought and speech. Oral exam: Pink mucosa, no swelling, no focal lesion.  Tongue appears normal.  No significant dryness of mucosa. No further exam today.  LABS:  Last CBC Lab Results  Component Value Date   WBC 4.7 09/27/2020   HGB 16.0 09/27/2020   HCT 47.6 09/27/2020   MCV 91.4 09/27/2020   MCH 30.7 09/27/2020   RDW 12.7 09/27/2020   PLT 189 97/35/3299   Last metabolic panel Lab Results  Component Value Date   GLUCOSE 81 09/27/2020   NA 140 09/27/2020   K 4.5 09/27/2020   CL 103 09/27/2020   CO2 28 09/27/2020   BUN 15 09/27/2020   CREATININE 1.11 09/27/2020   CALCIUM 9.5 09/27/2020   PROT 6.6 09/27/2020   ALBUMIN 4.2 12/12/2016   BILITOT 0.9 09/27/2020  ALKPHOS 64 12/12/2016   AST 17 09/27/2020   ALT 17 09/27/2020     Lab Results  Component Value Date   TSH 2.37 09/27/2020   IMPRESSION AND PLAN:  #1 anger control/irritability problems.  He does have some chronic anxiety and this also affects his sleep some. He notes that he thinks his motivation to do the things he likes has been affected by taking the sertraline.  At this point however he wants to go ahead and continue this medicine at current dose of 150 mg a day. Cont clonaz 0.5mg , 1-2 bid prn.  No new rx needed today. #2 chronic pain syndrome.  Only sporadic use of hydrocodone. No changes today.  Controlled substance contract is up-to-date.  3.  Dry mouth, tongue ulcers. Normal exam today. Low suspicion of Sjogren's syndrome or med side effect. He'll discuss with his dentist as well.  An After Visit Summary was printed and given to the patient.  FOLLOW UP: Return for 3-4 mo cpe.  Signed:  Crissie Sickles, MD           07/05/2021

## 2021-08-28 ENCOUNTER — Encounter: Payer: Self-pay | Admitting: Family Medicine

## 2021-08-28 ENCOUNTER — Other Ambulatory Visit: Payer: Self-pay | Admitting: Family Medicine

## 2021-08-28 NOTE — Telephone Encounter (Signed)
Requesting: Norco ?Contract: 04/04/21 ?UDS: N/A ?Last Visit: 07/05/21 ?Next Visit: 10/02/21 ?Last Refill:  04/04/21(30,0) ? ? ?Requesting: Clonazepam ?Contract: 04/04/21 ?UDS: N/A ?Last Visit: 07/05/21 ?Next Visit: 10/02/21 ?Last Refill: 06/15/20(120,2) ? ? ?Please Advise. Meds pending ?

## 2021-08-29 MED ORDER — CLONAZEPAM 0.5 MG PO TABS: ORAL_TABLET | ORAL | 2 refills | Status: DC

## 2021-08-29 MED ORDER — HYDROCODONE-ACETAMINOPHEN 5-325 MG PO TABS: 1.0000 | ORAL_TABLET | Freq: Four times a day (QID) | ORAL | 0 refills | Status: DC | PRN

## 2021-09-07 ENCOUNTER — Other Ambulatory Visit: Payer: Self-pay | Admitting: Family Medicine

## 2021-09-14 ENCOUNTER — Encounter: Payer: Self-pay | Admitting: Family Medicine

## 2021-09-14 MED ORDER — HYDROCODONE-ACETAMINOPHEN 7.5-325 MG PO TABS: 1.0000 | ORAL_TABLET | Freq: Four times a day (QID) | ORAL | 0 refills | Status: DC | PRN

## 2021-09-14 NOTE — Telephone Encounter (Signed)
Norco 7.5/325 sent. ?

## 2021-09-14 NOTE — Telephone Encounter (Signed)
Pt was given refill on Norco 5-'325mg'$  on 08/29/21. [facility] does not have medication in stock. They have 7.5 and '10mg'$  available. ? ? ?Please review and advise ? ?

## 2021-10-02 ENCOUNTER — Encounter: Payer: Self-pay | Admitting: Family Medicine

## 2021-10-02 ENCOUNTER — Ambulatory Visit (INDEPENDENT_AMBULATORY_CARE_PROVIDER_SITE_OTHER): Payer: 59 | Admitting: Family Medicine

## 2021-10-02 VITALS — BP 101/65 | HR 60 | Temp 98.8°F | Ht 73.0 in | Wt 170.8 lb

## 2021-10-02 DIAGNOSIS — R454 Irritability and anger: Secondary | ICD-10-CM

## 2021-10-02 DIAGNOSIS — Z0000 Encounter for general adult medical examination without abnormal findings: Secondary | ICD-10-CM

## 2021-10-02 DIAGNOSIS — G894 Chronic pain syndrome: Secondary | ICD-10-CM | POA: Diagnosis not present

## 2021-10-02 DIAGNOSIS — F419 Anxiety disorder, unspecified: Secondary | ICD-10-CM

## 2021-10-02 DIAGNOSIS — Z125 Encounter for screening for malignant neoplasm of prostate: Secondary | ICD-10-CM

## 2021-10-02 DIAGNOSIS — Z79899 Other long term (current) drug therapy: Secondary | ICD-10-CM

## 2021-10-02 DIAGNOSIS — Z6379 Other stressful life events affecting family and household: Secondary | ICD-10-CM

## 2021-10-02 LAB — CBC
HCT: 43.3 % (ref 39.0–52.0)
Hemoglobin: 14.7 g/dL (ref 13.0–17.0)
MCHC: 33.9 g/dL (ref 30.0–36.0)
MCV: 90 fl (ref 78.0–100.0)
Platelets: 142 10*3/uL — ABNORMAL LOW (ref 150.0–400.0)
RBC: 4.81 Mil/uL (ref 4.22–5.81)
RDW: 14 % (ref 11.5–15.5)
WBC: 4.4 10*3/uL (ref 4.0–10.5)

## 2021-10-02 NOTE — Progress Notes (Addendum)
OFFICE VISIT ? ?10/02/2021 ? ?CC:  ?Chief Complaint  ?Patient presents with  ? Annual Exam  ?  Pt is not fasting  ? ? ?Patient is a 61 y.o. male who presents for annual health maintenance exam and 3 mo f/u anxiety/mood/difficulty controlling anger, also f/u chronic pain syndrome. ?A/P as of last visit: ?"#1 anger control/irritability problems.  He does have some chronic anxiety and this also affects his sleep some. ?He notes that he thinks his motivation to do the things he likes has been affected by taking the sertraline.  At this point however he wants to go ahead and continue this medicine at current dose of 150 mg a day. ?Cont clonaz 0.'5mg'$ , 1-2 bid prn.  No new rx needed today. ?#2 chronic pain syndrome.  Only sporadic use of hydrocodone. ?No changes today.  Controlled substance contract is up-to-date. ?3.  Dry mouth, tongue ulcers. ?Normal exam today. ?Low suspicion of Sjogren's syndrome or med side effect. ?He'll discuss with his dentist as well." ? ?INTERIM HX: ?Keith Mckee says he is having a bit worse difficulty with controlling anger since last visit.  His daughter, who has a mental illness, is now living with him again and this has him quite stressed.  He is interested in counseling. ?Denies persistent depressed mood.  No panic attacks. ?He uses clonazepam only occasionally. ?He does not like the way sertraline makes him feel--slight lack of motivation.  However he does not want to go off this medication and potentially revert back to worse difficulties controlling anger, worse general anxiety, worse sleep etc. ? ?No change in chronic pain lately. ?Indication for chronic opioid: chronic neck and low back pain as well as peripheral neuropathic pain.  I have been rx'ing him low dose opioids long term and he has consistently used these in small amounts responsibly.  These improve his functioning and quality of life. ?Only occasionally takes a dose of Vicodin.  Has taken some lately due to some neck pain at the end  of the day after working on his rental home. ?Medication and dose: vicodin 7.5/325, 1-2 q6h prn. ?PMP AWARE reviewed today: most recent rx for vicodin was filled 09/14/21, # 41, rx by me. ?Most recent prescription for clonazepam filled 08/29/2021, #120, prescription by me. ?No red flags. ? ?Past Medical History:  ?Diagnosis Date  ? Anxiety   ? BPH (benign prostatic hypertrophy)   ? Cataract   ? artifiicial lenses in both eyes  ? Cervical spondylosis   ? with radiculopathy not responsive to conservative therapies:  [facility]: ACDF surgery C5-C7 performed 04/05/15  ? Chronic pain syndrome   ? cervicalgia, heredetary PN, LBP  ? COVID-19 virus infection 11/2020  ? Difficulty controlling anger   ? + GAD with OCD tendencies + memory changes---consider neuropsych testing  ? Dysplastic nevus 10/2015  ? left mid back: Dr. Denna Haggard following  ? Glaucoma   ? History of adenomatous polyp of colon   ? IBS (irritable bowel syndrome)   ? +constipation  ? Microscopic colitis 06/2019  ? colonoscopy + 06/2019  ? Neuropathic pain   ? Peripheral neuropathy   ? Idiopathic, inherited per pt's neurologist, Dr. Everette Rank. Various labs normal 07/2016 except vitamin B6 was siginificantly elevated.  Allergist eval/allergy testing (including food) ALL NORMAL/NEG 07/2016.  ? Restless legs syndrome 09/27/2014  ? Throat irritation   ? chronic (since cervical spine surgery 2016).  Dr. Erik Obey [id number]->[id number] normal, CT sinuses normal, barium swallow normal, trial of protonix no help.   ? ? ?  Past Surgical History:  ?Procedure Laterality Date  ? Ant cerv decompression/discectomy w/fusion C 5-7  04/05/15  ? C5-7 ACDF  ? CATARACT EXTRACTION Bilateral   ? COLONOSCOPY  06/29/2019  ? FOR EVAL OF UNEXPLAINED DIARRHEA-->normal, bx c/w microscopic colitis->uceris and budesonide rx'd by Dr. Silverio Decamp 06/2019  ? COLONOSCOPY W/ POLYPECTOMY  08/2012; 05/23/16  ? 2017, adenomatous polyp--recall 5 yrs.  ? EYE SURGERY Right 09/06/2020  ? TRABECULECTOMY  Bilateral   ? for uncontrolled glaucoma   ? TRANSURETHRAL RESECTION OF PROSTATE  2008  ? due to enlarged prostate  ? VASECTOMY  2001  ? ?Social History  ? ?Socioeconomic History  ? Marital status: Married  ?  Spouse name: Not on file  ? Number of children: 2  ? Years of education: Master D.  ? Highest education level: Not on file  ?Occupational History  ? Occupation: [employer]  ?  Employer: Building surveyor FOR SELF EMPLOYED  ?  Comment: [employer] [place]  ? Occupation: handyman  ?Tobacco Use  ? Smoking status: Former  ?  Packs/day: 0.75  ?  Years: 15.00  ?  Pack years: 11.25  ?  Types: Cigarettes  ?  Quit date: 06/24/1998  ?  Years since quitting: 23.2  ? Smokeless tobacco: Never  ? Tobacco comments:  ?  2 pack per week  ?Substance and Sexual Activity  ? Alcohol use: Yes  ?  Alcohol/week: 12.0 standard drinks  ?  Types: 12 Cans of beer per week  ?  Comment: Patient drinks 12 beers a week  ? Drug use: No  ? Sexual activity: Not on file  ?Other Topics Concern  ? Not on file  ?Social History Narrative  ? Married.  ? Educ: MBA  ? 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 No tob, occ alc, no drugs.  ? Exercises regularly.  ? Patient is right handed.  ? Enjoys fishing and woodworking.  ? ?Social Determinants of Health  ? ?Financial Resource Strain: Not on file  ?Food Insecurity: Not on file  ?Transportation Needs: Not on file  ?Physical Activity: Not on file  ?Stress: Not on file  ?Social Connections: Not on file  ? ?Family History  ?Problem Relation Age of Onset  ? Glaucoma Father   ? Heart disease Father   ? Neuropathy Father   ?     Peripheral neuropathy  ? Arthritis Father   ? Parkinson's disease Father   ? Schizophrenia Daughter   ? Colon cancer Neg Hx   ? Rectal cancer Neg Hx   ?\ ?Outpatient Medications Prior to Visit  ?Medication Sig Dispense Refill  ? Alpha-Lipoic Acid 300 MG CAPS Take 1 capsule by mouth 2 (two) times daily.    ? clonazePAM (KLONOPIN) 0.5 MG tablet 1-2 tabs po bid 120 tablet 2  ? gabapentin  (NEURONTIN) 600 MG tablet TAKE ONE AND ONE-HALF TABLETS IN THE MORNING AND ONE AND ONE-HALF TABLETS IN THE EVENING    ? HYDROcodone-acetaminophen (NORCO) 7.5-325 MG tablet Take 1 tablet by mouth every 6 (six) hours as needed for moderate pain. 30 tablet 0  ? HYDROcodone-acetaminophen (NORCO/VICODIN) 5-325 MG tablet Take 1-2 tablets by mouth every 6 (six) hours as needed for moderate pain. 30 tablet 0  ? Magnesium 300 MG CAPS Take by mouth as needed.     ? Omega-3 Fatty Acids (OMEGA-3 FISH OIL) 300 MG CAPS Take 1 capsule by mouth daily.    ? polyethylene glycol powder (GLYCOLAX/MIRALAX) 17 GM/SCOOP powder Take by mouth.    ?  sertraline (ZOLOFT) 100 MG tablet 1.5 tabs po qd 135 tablet 3  ? sildenafil (REVATIO) 20 MG tablet 1-5 tabs po qd prn intercourse 30 tablet 11  ? timolol (TIMOPTIC) 0.5 % ophthalmic solution SMARTSIG:In Eye(s)    ? AMBULATORY NON FORMULARY MEDICATION Medication Name: CBD Oil- Take '15mg'$  qhs (Patient not taking: Reported on 10/02/2021)    ? ketorolac (ACULAR) 0.5 % ophthalmic solution Place 1 drop into the right eye 2 (two) times daily. (Patient not taking: Reported on 10/02/2021)    ? ?No facility-administered medications prior to visit.  ? ? ?No Known Allergies ? ?Review of Systems  ?Constitutional:  Negative for chills and fever.  ?HENT:  Negative for congestion, ear pain and sore throat.   ?Eyes:  Negative for discharge and redness.  ?Respiratory:  Negative for cough, shortness of breath and wheezing.   ?Cardiovascular:  Negative for chest pain, palpitations and leg swelling.  ?Gastrointestinal:  Negative for abdominal pain, blood in stool, diarrhea, nausea and vomiting.  ?Genitourinary:  Negative for dysuria, flank pain, frequency, hematuria and urgency.  ?Musculoskeletal:  Negative for back pain and myalgias.  ?Skin:  Negative for rash.  ?Neurological:  Negative for dizziness, weakness and headaches.  ?Endo/Heme/Allergies:  Does not bruise/bleed easily.  ?Psychiatric/Behavioral:  The patient is  not nervous/anxious.   ? ? ?PE: ? ?  10/02/2021  ?  9:08 AM 07/05/2021  ?  9:31 AM 04/04/2021  ?  9:35 AM  ?Vitals with BMI  ?Height '6\' 1"'$  5' 11.75" 5' 11.75"  ?Weight 170 lbs 13 oz 167 lbs 6 oz 164 lbs

## 2021-10-02 NOTE — Patient Instructions (Signed)

## 2021-10-03 LAB — DRUG MONITORING PANEL 376104, URINE
Amphetamines: NEGATIVE ng/mL (ref <500)
Barbiturates: NEGATIVE ng/mL (ref <300)
Benzodiazepines: NEGATIVE ng/mL (ref <100)
Cocaine Metabolite: NEGATIVE ng/mL (ref <150)
Desmethyltramadol: NEGATIVE ng/mL (ref <100)
Opiates: NEGATIVE ng/mL (ref <100)
Oxycodone: NEGATIVE ng/mL (ref <100)
Tramadol: NEGATIVE ng/mL (ref <100)

## 2021-10-03 LAB — LIPID PANEL
Cholesterol: 186 mg/dL (ref <200)
HDL: 65 mg/dL (ref >=40)
LDL Cholesterol (Calc): 88 mg/dL (calc)
Non-HDL Cholesterol (Calc): 121 mg/dL (calc) (ref <130)
Total CHOL/HDL Ratio: 2.9 (calc) (ref <5.0)
Triglycerides: 248 mg/dL — ABNORMAL HIGH (ref <150)

## 2021-10-03 LAB — COMPREHENSIVE METABOLIC PANEL
AG Ratio: 2.1 (calc) (ref 1.0–2.5)
ALT: 13 U/L (ref 9–46)
AST: 14 U/L (ref 10–35)
Albumin: 4.2 g/dL (ref 3.6–5.1)
Alkaline phosphatase (APISO): 67 U/L (ref 35–144)
BUN: 16 mg/dL (ref 7–25)
CO2: 24 mmol/L (ref 20–32)
Calcium: 9.7 mg/dL (ref 8.6–10.3)
Chloride: 104 mmol/L (ref 98–110)
Creat: 1.1 mg/dL (ref 0.70–1.30)
Globulin: 2 g/dL (calc) (ref 1.9–3.7)
Glucose, Bld: 57 mg/dL — ABNORMAL LOW (ref 65–99)
Potassium: 4.1 mmol/L (ref 3.5–5.3)
Sodium: 140 mmol/L (ref 135–146)
Total Bilirubin: 0.6 mg/dL (ref 0.2–1.2)
Total Protein: 6.2 g/dL (ref 6.1–8.1)

## 2021-10-03 LAB — DM TEMPLATE

## 2021-10-03 LAB — PSA: PSA: 0.93 ng/mL (ref <=4.00)

## 2021-10-03 LAB — TSH: TSH: 2.68 mIU/L (ref 0.40–4.50)

## 2021-10-07 ENCOUNTER — Other Ambulatory Visit: Payer: Self-pay | Admitting: Family Medicine

## 2021-11-06 ENCOUNTER — Encounter: Payer: Self-pay | Admitting: Family Medicine

## 2021-11-07 NOTE — Telephone Encounter (Signed)
Keith Mckee can you or [name] check on the status of this referral? ?

## 2021-11-11 ENCOUNTER — Other Ambulatory Visit: Payer: Self-pay | Admitting: Family Medicine

## 2021-12-24 ENCOUNTER — Ambulatory Visit: Payer: 59 | Admitting: Psychiatry

## 2022-01-01 ENCOUNTER — Encounter: Payer: Self-pay | Admitting: Family Medicine

## 2022-01-01 ENCOUNTER — Ambulatory Visit (INDEPENDENT_AMBULATORY_CARE_PROVIDER_SITE_OTHER): Payer: 59 | Admitting: Family Medicine

## 2022-01-01 VITALS — BP 118/73 | HR 54 | Temp 98.0°F | Ht 73.0 in | Wt 172.2 lb

## 2022-01-01 DIAGNOSIS — M47812 Spondylosis without myelopathy or radiculopathy, cervical region: Secondary | ICD-10-CM

## 2022-01-01 DIAGNOSIS — G894 Chronic pain syndrome: Secondary | ICD-10-CM | POA: Diagnosis not present

## 2022-01-01 DIAGNOSIS — F419 Anxiety disorder, unspecified: Secondary | ICD-10-CM

## 2022-01-01 DIAGNOSIS — Z79899 Other long term (current) drug therapy: Secondary | ICD-10-CM

## 2022-01-01 DIAGNOSIS — M792 Neuralgia and neuritis, unspecified: Secondary | ICD-10-CM

## 2022-01-01 DIAGNOSIS — F5104 Psychophysiologic insomnia: Secondary | ICD-10-CM

## 2022-01-01 DIAGNOSIS — R454 Irritability and anger: Secondary | ICD-10-CM

## 2022-01-01 NOTE — Progress Notes (Signed)
OFFICE VISIT  01/01/2022  CC:  Chief Complaint  Patient presents with   Anxiety    Patient is a 61 y.o. male who presents for 32-monthfollow-up anxiety and chronic pain syndrome. A/P as of last visit: "#1 difficulty controlling anger, chronic low-level anxiety, anxiety related insomnia. Fairly well controlled on sertraline 150 mg/day. He is not interested in changing dose of this medication.  Continue clonazepam 0.5 mg twice daily as needed.  Will refer him for counseling for help with coping with living with his mentally ill daughter as well as his difficulties controlling anger. Controlled substance contract is up-to-date.   #2 chronic pain syndrome.  Chronic neck and back pain, idiopathic peripheral neuropathy. Stable.  Continue gabapentin 900 mg twice daily.  Continue Vicodin 7.5/325, 1 every 6 hours as needed.  No new prescription was needed today.   #3 Health maintenance exam: Reviewed age and gender appropriate health maintenance issues (prudent diet, regular exercise, health risks of tobacco and excessive alcohol, use of seatbelts, fire alarms in home, use of sunscreen).  Also reviewed age and gender appropriate health screening as well as vaccine recommendations. Vaccines: all UTD. Labs:  HP + PSA today (pt is not fasting) Prostate ca screening: PSA today"  INTERIM HX: Says he has been doing about the same regarding his mood and anxieties. He takes clonazepam only occasionally. He reiterates that he erectile dysfunction side effect from his sertraline is a significant ongoing problem and he would like to eventually get off this medication.  He does not feel like it is helping significantly.  [facility]    Pain level is the same as usual.  When he does a lot of physical activity it does flareup Indication for chronic opioid: chronic neck and low back pain as well as peripheral neuropathic pain.  I have been  rx'ing him low dose opioids long term and he has consistently used these in small amounts responsibly.  These improve his functioning and quality of life. Only occasionally takes a dose of Vicodin, most commonly when his hands hurt at night and he cannot get to sleep.  He also takes some after lots of daytime physical activity that resulted in a flare of his neck and back pain.. Medication and dose: vicodin 7.5/325, 1-2 q6h prn. PMP AWARE reviewed today: most recent rx for Vicodin was filled 09/14/2021, #30, rx by me.  Most recent prescription for clonazepam was filled 08/29/2021, #120, prescription by me No red flags.  Past Medical History:  Diagnosis Date   Anxiety    BPH (benign prostatic hypertrophy)    Cataract    artifiicial lenses in both eyes   Cervical spondylosis    with radiculopathy not responsive to conservative therapies:  [facility]: ACDF surgery C5-C7 performed 04/05/15   Chronic pain syndrome    cervicalgia, heredetary PN, LBP   COVID-19 virus infection 11/2020   Difficulty controlling anger    + GAD with OCD tendencies + memory changes---consider neuropsych testing   Dysplastic nevus 10/2015   left mid back: Dr. TDenna Haggardfollowing   Glaucoma    History of adenomatous polyp of colon    IBS (irritable bowel syndrome)    +constipation   Microscopic colitis 06/2019   colonoscopy + 06/2019   Neuropathic pain    Peripheral neuropathy    Idiopathic, inherited per pt's neurologist, Dr. TEverette Rank Various labs normal 07/2016 except vitamin B6 was siginificantly elevated.  Allergist eval/allergy testing (including food) ALL NORMAL/NEG 07/2016.   Restless legs syndrome 09/27/2014   Throat irritation    chronic (since cervical spine surgery 2016).  Dr. Erik Obey [id number]->[id number] normal, CT sinuses normal, barium swallow normal, trial of protonix no help.     Past Surgical History:  Procedure Laterality Date   Ant cerv decompression/discectomy w/fusion C 5-7   04/05/15   C5-7 ACDF   CATARACT EXTRACTION Bilateral    COLONOSCOPY  06/29/2019   FOR EVAL OF UNEXPLAINED DIARRHEA-->normal, bx c/w microscopic colitis->uceris and budesonide rx'd by Dr. Silverio Decamp 06/2019   COLONOSCOPY W/ POLYPECTOMY  08/2012; 05/23/16   2017, adenomatous polyp--recall 5 yrs.   EYE SURGERY Right 09/06/2020   TRABECULECTOMY Bilateral    for uncontrolled glaucoma    TRANSURETHRAL RESECTION OF PROSTATE  2008   due to enlarged prostate   VASECTOMY  2001    Outpatient Medications Prior to Visit  Medication Sig Dispense Refill   Alpha-Lipoic Acid 300 MG CAPS Take 1 capsule by mouth 2 (two) times daily.     clonazePAM (KLONOPIN) 0.5 MG tablet 1-2 tabs po bid 120 tablet 2   dorzolamide-timolol (COSOPT) 22.3-6.8 MG/ML ophthalmic solution Place 1 drop into the right eye 2 (two) times daily.     gabapentin (NEURONTIN) 600 MG tablet TAKE ONE AND ONE-HALF TABLETS IN THE MORNING AND ONE AND ONE-HALF TABLETS IN THE EVENING     HYDROcodone-acetaminophen (NORCO) 7.5-325 MG tablet Take 1 tablet by mouth every 6 (six) hours as needed for moderate pain. 30 tablet 0   HYDROcodone-acetaminophen (NORCO/VICODIN) 5-325 MG tablet Take 1-2 tablets by mouth every 6 (six) hours as needed for moderate pain. 30 tablet 0   Magnesium 300 MG CAPS Take by mouth as needed.      Omega-3 Fatty Acids (OMEGA-3 FISH OIL) 300 MG CAPS Take 1 capsule by mouth daily.     polyethylene glycol powder (GLYCOLAX/MIRALAX) 17 GM/SCOOP powder Take by mouth.     sertraline (ZOLOFT) 100 MG tablet TAKE 1.5 TABLETS BY MOUTH EVERY DAY 135 tablet 4   sildenafil (REVATIO) 20 MG tablet 1-5 tabs po qd prn intercourse 30 tablet 11   timolol (TIMOPTIC) 0.5 % ophthalmic solution SMARTSIG:In Eye(s)     No facility-administered medications prior to visit.    No Known Allergies  ROS As per HPI  PE:    01/01/2022    9:06 AM 10/02/2021    9:08 AM 07/05/2021    9:31 AM  Vitals with BMI  Height '6\' 1"'$  '6\' 1"'$  5' 11.75"  Weight 172  lbs 3 oz 170 lbs 13 oz 167 lbs 6 oz  BMI 22.72 89.38 10.17  Systolic 510 258 527  Diastolic 73 65 65  Pulse 54 60 61     Physical Exam  Gen: Alert, well appearing.  Patient is oriented to person, place, time, and situation.a AFFECT: pleasant, lucid thought and speech. No further exam today.  LABS:  Last CBC Lab Results  Component Value Date   WBC 4.4 10/02/2021   HGB 14.7 10/02/2021   HCT 43.3 10/02/2021   MCV 90.0 10/02/2021   MCH 30.7 09/27/2020   RDW 14.0 10/02/2021   PLT 142.0 (L) 78/24/2353   Last metabolic panel Lab Results  Component Value Date   GLUCOSE 57 (L) 10/02/2021   NA 140 10/02/2021   K 4.1 10/02/2021   CL 104 10/02/2021   CO2 24 10/02/2021   BUN 16 10/02/2021   CREATININE 1.10 10/02/2021   CALCIUM 9.7 10/02/2021  PROT 6.2 10/02/2021   ALBUMIN 4.2 12/12/2016   BILITOT 0.6 10/02/2021   ALKPHOS 64 12/12/2016   AST 14 10/02/2021   ALT 13 10/02/2021   Last lipids Lab Results  Component Value Date   CHOL 186 10/02/2021   HDL 65 10/02/2021   LDLCALC 88 10/02/2021   TRIG 248 (H) 10/02/2021   CHOLHDL 2.9 10/02/2021   Last thyroid functions Lab Results  Component Value Date   TSH 2.68 10/02/2021   IMPRESSION AND PLAN:  #1 Difficulty controlling anger, chronic low-level anxiety, anxiety related insomnia. Not much improvement on sertraline 50 mg a day.  Due to side effect of erectile dysfunction he does want to ween off this medicine but wants to wait until he gets established with his counselor. He will continue to use clonazepam on a as needed basis.  2.  Chronic pain syndrome--neck and low back, plus neuropathic pain due to hereditary peripheral neuropathy. Continue Vicodin 7.5/325, which she uses in small amounts. Controlled substance contract and urine drug screen are up-to-date. No new prescription was needed today. He will also continue gabapentin 600 mg tabs, 1-1/2 tabs twice a day.  An After Visit Summary was printed and given to  the patient.  FOLLOW UP: No follow-ups on file. Next cpe 09/2022  Signed:  Crissie Sickles, MD           01/01/2022

## 2022-01-08 ENCOUNTER — Ambulatory Visit: Payer: 59 | Admitting: Psychiatry

## 2022-01-10 ENCOUNTER — Ambulatory Visit (INDEPENDENT_AMBULATORY_CARE_PROVIDER_SITE_OTHER): Payer: 59 | Admitting: Psychiatry

## 2022-01-10 DIAGNOSIS — F411 Generalized anxiety disorder: Secondary | ICD-10-CM
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
## 2022-01-24 ENCOUNTER — Ambulatory Visit (INDEPENDENT_AMBULATORY_CARE_PROVIDER_SITE_OTHER): Payer: 59 | Admitting: Family Medicine

## 2022-01-24 ENCOUNTER — Encounter: Payer: Self-pay | Admitting: Family Medicine

## 2022-01-24 VITALS — BP 97/60 | HR 63 | Temp 97.8°F | Ht 73.0 in | Wt 167.8 lb

## 2022-01-24 DIAGNOSIS — S30860A Insect bite (nonvenomous) of lower back and pelvis, initial encounter: Secondary | ICD-10-CM | POA: Diagnosis not present

## 2022-01-24 DIAGNOSIS — R5383 Other fatigue: Secondary | ICD-10-CM

## 2022-01-24 DIAGNOSIS — M2550 Pain in unspecified joint: Secondary | ICD-10-CM | POA: Diagnosis not present

## 2022-01-24 DIAGNOSIS — R519 Headache, unspecified: Secondary | ICD-10-CM

## 2022-01-24 DIAGNOSIS — W57XXXA Bitten or stung by nonvenomous insect and other nonvenomous arthropods, initial encounter: Secondary | ICD-10-CM

## 2022-01-24 NOTE — Progress Notes (Signed)
OFFICE VISIT  01/24/2022  CC:  Chief Complaint  Patient presents with   Fatigue    Pt had tick bite 1 month ago; bite on waistline 9 days ago, unsure if it was a tick.    Headache   Patient is a 61 y.o. male who presents for fatigue and HA.  HPI: Onset 4-5 d/a-->HA, fatigue, diffuse joint aches (particularly hands).  Neck and LB hurting more than usual.  Has had a few tick bites in the last month. Most recent one about 10 d/a left a half-dollar size area of redness around the bite, still a dime size area now.  No other rash.  No fever.  No ST or cough.  Has chronic nasal/sinus congest, no change. No n/v/d.  He has not seen joint swelling or redness.  ROS as above, plus--> no CP, no SOB, no wheezing, no dizziness, no HAs, no melena/hematochezia.  No polyuria or polydipsia. No focal weakness, paresthesias, or tremors.  No acute vision or hearing abnormalities.  No dysuria or unusual/new urinary urgency or frequency.  No recent changes in lower legs.  No palpitations.     Past Medical History:  Diagnosis Date   Anxiety    BPH (benign prostatic hypertrophy)    Cataract    artifiicial lenses in both eyes   Cervical spondylosis    with radiculopathy not responsive to conservative therapies:  [facility] 02/2015: ACDF surgery C5-C7 performed 04/05/15   Chronic pain syndrome    cervicalgia, heredetary PN, LBP   COVID-19 virus infection 11/2020   Difficulty controlling anger    + GAD with OCD tendencies + memory changes---consider neuropsych testing   Dysplastic nevus 10/2015   left mid back: Dr. Denna Haggard following   Glaucoma    History of adenomatous polyp of colon    IBS (irritable bowel syndrome)    +constipation   Microscopic colitis 06/2019   colonoscopy + 06/2019   Neuropathic pain    Peripheral neuropathy    Idiopathic, inherited per pt's neurologist, Dr. Everette Rank. Various labs normal 07/2016 except vitamin B6 was siginificantly elevated.  Allergist eval/allergy  testing (including food) ALL NORMAL/NEG 07/2016.   Restless legs syndrome 09/27/2014   Throat irritation    chronic (since cervical spine surgery 2016).  Dr. Erik Obey [id number]->[id number] normal, CT sinuses normal, barium swallow normal, trial of protonix no help.     Past Surgical History:  Procedure Laterality Date   Ant cerv decompression/discectomy w/fusion C 5-7  04/05/15   C5-7 ACDF   CATARACT EXTRACTION Bilateral    COLONOSCOPY  06/29/2019   FOR EVAL OF UNEXPLAINED DIARRHEA-->normal, bx c/w microscopic colitis->uceris and budesonide rx'd by Dr. Silverio Decamp 06/2019   COLONOSCOPY W/ POLYPECTOMY  08/2012; 05/23/16   2017, adenomatous polyp--recall 5 yrs.   EYE SURGERY Right 09/06/2020   TRABECULECTOMY Bilateral    for uncontrolled glaucoma    TRANSURETHRAL RESECTION OF PROSTATE  2008   due to enlarged prostate   VASECTOMY  2001    Outpatient Medications Prior to Visit  Medication Sig Dispense Refill   Alpha-Lipoic Acid 300 MG CAPS Take 1 capsule by mouth 2 (two) times daily.     clonazePAM (KLONOPIN) 0.5 MG tablet 1-2 tabs po bid 120 tablet 2   dorzolamide-timolol (COSOPT) 22.3-6.8 MG/ML ophthalmic solution Place 1 drop into the right eye 2 (two) times daily.     gabapentin (NEURONTIN) 600 MG tablet TAKE ONE AND ONE-HALF TABLETS IN THE MORNING AND ONE AND ONE-HALF TABLETS IN THE EVENING  HYDROcodone-acetaminophen (NORCO) 7.5-325 MG tablet Take 1 tablet by mouth every 6 (six) hours as needed for moderate pain. 30 tablet 0   HYDROcodone-acetaminophen (NORCO/VICODIN) 5-325 MG tablet Take 1-2 tablets by mouth every 6 (six) hours as needed for moderate pain. 30 tablet 0   Magnesium 300 MG CAPS Take by mouth as needed.      Omega-3 Fatty Acids (OMEGA-3 FISH OIL) 300 MG CAPS Take 1 capsule by mouth daily.     polyethylene glycol powder (GLYCOLAX/MIRALAX) 17 GM/SCOOP powder Take by mouth.     sertraline (ZOLOFT) 100 MG tablet TAKE 1.5 TABLETS BY MOUTH EVERY DAY 135 tablet 4   sildenafil  (REVATIO) 20 MG tablet 1-5 tabs po qd prn intercourse 30 tablet 11   timolol (TIMOPTIC) 0.5 % ophthalmic solution SMARTSIG:In Eye(s)     No facility-administered medications prior to visit.    No Known Allergies  ROS As per HPI  PE:    01/24/2022    2:59 PM 01/01/2022    9:06 AM 10/02/2021    9:08 AM  Vitals with BMI  Height '6\' 1"'$  '6\' 1"'$  '6\' 1"'$   Weight 167 lbs 13 oz 172 lbs 3 oz 170 lbs 13 oz  BMI 22.14 16.10 96.04  Systolic 97 540 981  Diastolic 60 73 65  Pulse 63 54 60   Physical Exam  Gen: Alert, well appearing.  Patient is oriented to person, place, time, and situation.. AFFECT: pleasant, lucid thought and speech. XBJ:YNWG: no injection, icteris, swelling, or exudate.  EOMI, PERRLA. Mouth: lips without lesion/swelling.  Oral mucosa pink and moist. Oropharynx without erythema, exudate, or swelling.  Neck - No masses or thyromegaly or limitation in range of motion CV: RRR, no m/r/g.   LUNGS: CTA bilat, nonlabored resps, good aeration in all lung fields. EXT: no clubbing or cyanosis.  no edema.  R post waist line with small erythe papule with a few mm of surrounding erythema.  No vesicle or pustule.  LABS:  Last CBC Lab Results  Component Value Date   WBC 4.4 10/02/2021   HGB 14.7 10/02/2021   HCT 43.3 10/02/2021   MCV 90.0 10/02/2021   MCH 30.7 09/27/2020   RDW 14.0 10/02/2021   PLT 142.0 (L) 95/62/1308   Last metabolic panel Lab Results  Component Value Date   GLUCOSE 57 (L) 10/02/2021   NA 140 10/02/2021   K 4.1 10/02/2021   CL 104 10/02/2021   CO2 24 10/02/2021   BUN 16 10/02/2021   CREATININE 1.10 10/02/2021   CALCIUM 9.7 10/02/2021   PROT 6.2 10/02/2021   ALBUMIN 4.2 12/12/2016   BILITOT 0.6 10/02/2021   ALKPHOS 64 12/12/2016   AST 14 10/02/2021   ALT 13 10/02/2021   IMPRESSION AND PLAN:  #1 headache, fatigue, and body aches. Recent tick bites, suspicion of possible Lyme disease. Will hold off on empiric doxycycline at this time. Viral syndrome  suspected.    We will check CBC, sed rate, and Lyme serology/reflex Western blot.  An After Visit Summary was printed and given to the patient.  FOLLOW UP: Return if symptoms worsen or fail to improve.  Signed:  Crissie Sickles, MD           01/24/2022

## 2022-01-25 LAB — CBC WITH DIFFERENTIAL/PLATELET
Basophils Absolute: 0 10*3/uL (ref 0.0–0.1)
Basophils Relative: 1 % (ref 0.0–3.0)
Eosinophils Absolute: 0.1 10*3/uL (ref 0.0–0.7)
Eosinophils Relative: 2.8 % (ref 0.0–5.0)
HCT: 44.6 % (ref 39.0–52.0)
Hemoglobin: 15.3 g/dL (ref 13.0–17.0)
Lymphocytes Relative: 20.7 % (ref 12.0–46.0)
Lymphs Abs: 0.8 10*3/uL (ref 0.7–4.0)
MCHC: 34.3 g/dL (ref 30.0–36.0)
MCV: 89.6 fl (ref 78.0–100.0)
Monocytes Absolute: 0.4 10*3/uL (ref 0.1–1.0)
Monocytes Relative: 11.1 % (ref 3.0–12.0)
Neutro Abs: 2.6 10*3/uL (ref 1.4–7.7)
Neutrophils Relative %: 64.4 % (ref 43.0–77.0)
Platelets: 118 10*3/uL — ABNORMAL LOW (ref 150.0–400.0)
RBC: 4.98 Mil/uL (ref 4.22–5.81)
RDW: 14.1 % (ref 11.5–15.5)
WBC: 4 10*3/uL (ref 4.0–10.5)

## 2022-01-25 LAB — COMPREHENSIVE METABOLIC PANEL
ALT: 25 U/L (ref 0–53)
AST: 26 U/L (ref 0–37)
Albumin: 4.5 g/dL (ref 3.5–5.2)
Alkaline Phosphatase: 69 U/L (ref 39–117)
BUN: 23 mg/dL (ref 6–23)
CO2: 28 mEq/L (ref 19–32)
Calcium: 9.5 mg/dL (ref 8.4–10.5)
Chloride: 102 mEq/L (ref 96–112)
Creatinine, Ser: 1.11 mg/dL (ref 0.40–1.50)
GFR: 72.7 mL/min (ref >60.00)
Glucose, Bld: 90 mg/dL (ref 70–99)
Potassium: 4.1 mEq/L (ref 3.5–5.1)
Sodium: 138 mEq/L (ref 135–145)
Total Bilirubin: 0.6 mg/dL (ref 0.2–1.2)
Total Protein: 6.7 g/dL (ref 6.0–8.3)

## 2022-01-25 LAB — SEDIMENTATION RATE: Sed Rate: 3 mm/hr (ref 0–20)

## 2022-01-28 LAB — LYME DISEASE SEROLOGY W/REFLEX: Lyme Total Antibody EIA: NEGATIVE

## 2022-01-30 ENCOUNTER — Telehealth: Payer: Self-pay

## 2022-01-30 DIAGNOSIS — D696 Thrombocytopenia, unspecified: Secondary | ICD-10-CM

## 2022-01-30 NOTE — Telephone Encounter (Signed)
-----   Message from Tammi Sou, MD sent at 01/28/2022  1:46 PM EDT ----- All labs normal except platelets slightly low. This is a nonspecific finding and is likely of no clinical significance. However, would like him to return for recheck CBC and 3 to 4 weeks to make sure this is not trending down.  CBC no diff, dx thrombocytopenia.

## 2022-02-01 ENCOUNTER — Ambulatory Visit: Payer: 59 | Admitting: Psychiatry

## 2022-02-01 NOTE — Telephone Encounter (Signed)
Patient wife (DPR) calling regarding messages left for patient to call office regarding results.  Patient is out of town until next week.  Wife will have patient call office next week when he has returned home.

## 2022-02-05 ENCOUNTER — Ambulatory Visit (INDEPENDENT_AMBULATORY_CARE_PROVIDER_SITE_OTHER): Payer: 59 | Admitting: Psychiatry

## 2022-02-05 DIAGNOSIS — F411 Generalized anxiety disorder: Secondary | ICD-10-CM

## 2022-02-05 NOTE — Progress Notes (Signed)
[facility] Counselor/Therapist Progress Note  Patient ID: Keith Mckee, MRN: [id number],    Date: 02/05/2022  Time Spent: 55 minutes   Treatment Type: Individual Therapy  Reported Symptoms: anxiety, "anger when things don't go right or don't go my way"  Mental Status Exam:  Appearance:   Neat     Behavior:  Appropriate, Sharing, and Motivated  Motor:  Normal  Speech/Language:   Clear and Coherent  Affect:  anxious  Mood:  anxious and some anger but decreased  Thought process:  goal directed  Thought content:    WNL  Sensory/Perceptual disturbances:    WNL  Orientation:  oriented to person, place, time/date, situation, day of week, month of year, year, and stated date of February 05, 2022  Attention:  Good  Concentration:  Good  Memory:  WNL  Fund of knowledge:   Good  Insight:    Good  Judgment:   Good  Impulse Control:  Fair   Risk Assessment: Danger to Self:  No Self-injurious Behavior: No Danger to Others: No Duty to Warn:no Physical Aggression / Violence:No  Access to Firearms a concern: No  Gang Involvement:No   Subjective: Patient in today reporting anxiety and anger. Feels his anger and impulsiveness still come out "even though I'm taking various meds that don't seem to be helping over the past couple yrs. "Hard to tolerated things not going my way." I get very frustrated and angry with people in driving. Talks through this in detail today and how he ineffectively he manages his frustration and anger. Adds today that he's had anger issues since he was a teenager but can't identify anything that he feels would have led to that, nor any "negative role models".  To work with some homework between sessions, beginning with interrupting his angry thoughts, delaying action, refraining from impulsively responding in the moment.  Patient unsure of himself and his ability to change which we talked through for today.  States he has tried several different medicines over a  period of time thinking "that would fix it" but that has not worked for him.  Also to talk further about some of the stressors within family situations and his daughter with some of her own behavior health issues, as he is commented several times how his symptoms can arise in interactions within the family.  Seems to still question himself in regards to his ability to make changes and also handle family situations better, and better accept  Interventions: Cognitive Behavioral Therapy and Ego-Supportive  Diagnosis:   ICD-10-CM   1. Generalized anxiety disorder  F41.1      Plan: Patient in today explaining more about his history of anger, impulsivity, family issues and particularly struggles with a daughter who has significant behavioral health concerns.  Today focus more on his "instant anger" and impulsivity and is planning to start working on some new behaviors regarding triggers which we discussed at length today.  Seems to have some motivation but also questions whether he can make any changes.  Encouraged patient to follow through on suggestions made today as well as homework and that it is possible for him to make changes, and that will be helpful for him to practice looking for what might go right versus wrong.  Encouraged patient also to practice self calming behaviors, more positive self talk, staying in the present versus the past or too far into the future, staying in contact with people that are supportive, and realize the strength he  shows working with goal directed behaviors to move in a direction that supports his improved emotional health and overall wellbeing.  Goal review and progress/challenges noted with patient.  Next appointment within 2 weeks.  This record has been created using [employer].  Chart creation errors have been sought, but may not always have been located and corrected.  Such creation errors do not reflect on the standard of medical care provided.   Shanon Ace, LCSW

## 2022-02-13 ENCOUNTER — Ambulatory Visit (INDEPENDENT_AMBULATORY_CARE_PROVIDER_SITE_OTHER): Payer: 59 | Admitting: Psychiatry

## 2022-02-13 DIAGNOSIS — F411 Generalized anxiety disorder: Secondary | ICD-10-CM

## 2022-02-13 NOTE — Progress Notes (Signed)
[facility] Counselor/Therapist Progress Note  Patient ID: Keith Mckee, MRN: [id number],    Date: 02/13/2022  Time Spent: 55 minutes   Treatment Type: Individual Therapy  Reported Symptoms: anxiety, impulsiveness, anger (some better)  Mental Status Exam:  Appearance:   Casual     Behavior:  Appropriate, Sharing, and Motivated  Motor:  Normal  Speech/Language:   Clear and Coherent  Affect:  anxious  Mood:  anxious, "anger comes and goes"  Thought process:  goal directed  Thought content:    WNL  Sensory/Perceptual disturbances:    WNL  Orientation:  oriented to person, place, time/date, situation, day of week, month of year, year, and stated date of February 13, 2022  Attention:  Fair  Concentration:  Fair  Memory:  [place] of knowledge:   Good  Insight:    Good and Fair  Judgment:   Good  Impulse Control:  Fair and Poor   Risk Assessment: Danger to Self:  No Self-injurious Behavior: No Danger to Others: No Duty to Warn:no Physical Aggression / Violence:No  Access to Firearms a concern: No  Gang Involvement:No   Subjective: Patient in today reporting anxiety, anger (some better at times), and impulsiveness ("which has been some little bit better and trying to act less impulsively). Trying to be more patient when things don't go as planned or expected. Impatient in traffic, impatient with his dog, other people. "Things in the world, whole world," are so bad and that frustrate and angers me. Worked more today with strategies related to his impulsivity and negative reactions towards others. Wanting to also talk about his older daughter with schizophrenia, 66 yrs old and patient having some guilt in relationship. Daughter "[name]" living in town with her mother (patient's ex-wife in marriage that "was not good")who has colon cancer and expected to live possibly 3-9 months. Ex wife used to accuse me of being an absent dad with their daughter, B. "I can't have B live  with me because of what I have at my house including alcohol." Wants to talk about this daughter and relationship/responsibility next session.  Not questioning himself quite as much in ability to make changes and handle family situations better.  Not overly confident either but open to working more on this.  Adds that he has been managing things differently for a number of years but does realize he needs to work on some changes.  Interventions: Cognitive Behavioral Therapy and Ego-Supportive  Treatment goal plan: Patient not signing treatment goal plan on computer screen due to COVID.  He collaborated with this therapist on the formulation of his treatment plan and is in agreement with treatment goals. Treatment goals: Treatment goals remain on treatment plan as patient works with strategies to achieve his goals.  Progress is assessed each session and noted in these "subject"/"plan" section of treatment note. Long-term goal: Reduce overall level, frequency, and intensity of the anxiety so that daily functioning is not impaired. Short-term goal: Increase understanding of beliefs and messages that produce the worry and anxiety.  Work to change those beliefs and messages to healthier beliefs that can encourage patient and do not produce worry and anxiety. Strategies: Identify and used specific coping strategies for anxiety reduction.  Diagnosis:   ICD-10-CM   1. Generalized anxiety disorder  F41.1      Plan:  Patient in today showing motivation and participation in session but finding it hard at times to try and make changes that could be helpful.Marland Kitchen  As noted above, did work in session today on his impulsiveness and prior feelings that there is not a lot he can do to change that, however today, seem to have a more open attitude to this the more we talked as I provided very specific examples to him as well as discussing the dangers of not decreasing his impulsiveness.  Motivation seemed to be better  today.  Also needing to work on issues regarding his 66 year old daughter with schizophrenia that does not live with him but lives currently.  Agreed to make this more of a priority next session as we ran out of time today.  Patient to continue to work with some of the strategies we discussed today especially regarding his anger and impulsiveness, and be open to looking more for what might go right than wrong. Encouraged patient in practicing more positive behaviors including: More positive self talk, staying in the present versus the past or too far into the future, practicing self calming behaviors as discussed in session, realize the strength he shows working with goal directed behaviors to move in a direction that supports his improved emotional health and management of anxiety and anger in healthier ways.  Goal review and progress/challenges noted with patient.  Next appointment within 2 to 3 weeks.  This record has been created using [employer].  Chart creation errors have been sought, but may not always have been located and corrected.  Such creation errors do not reflect on the standard of medical care provided.   Shanon Ace, LCSW

## 2022-02-21 ENCOUNTER — Ambulatory Visit (INDEPENDENT_AMBULATORY_CARE_PROVIDER_SITE_OTHER): Payer: 59 | Admitting: Family Medicine

## 2022-02-21 ENCOUNTER — Encounter: Payer: Self-pay | Admitting: Family Medicine

## 2022-02-21 VITALS — BP 106/71 | HR 66 | Temp 98.1°F | Ht 73.0 in | Wt 168.2 lb

## 2022-02-21 DIAGNOSIS — J3489 Other specified disorders of nose and nasal sinuses: Secondary | ICD-10-CM | POA: Diagnosis not present

## 2022-02-21 DIAGNOSIS — M7711 Lateral epicondylitis, right elbow: Secondary | ICD-10-CM

## 2022-02-21 DIAGNOSIS — D696 Thrombocytopenia, unspecified: Secondary | ICD-10-CM

## 2022-02-21 NOTE — Progress Notes (Signed)
OFFICE VISIT  02/21/2022  CC:  Chief Complaint  Patient presents with   Follow-up    Labs; recheck blood count    Patient is a 61 y.o. male who presents for recent mild thrombocytopenia.  INTERIM HX:  On routine CBCs patient's typical platelet count was around 180,000. Platelet 142,000 about 4 months ago, on routine CBC. About 1 month ago the repeat was down to 118,000. Reassurance was given and plan was to recheck levels now. White blood cells, differential, and hemoglobin/hematocrit have been normal each time.  No history of liver or spleen imaging in EMR.  Reports about 1 to 2-week history of right elbow pain.  Onset during a fishing trip when casting a lot.  Mild pain, constant, worse with wrist extension. No treatments have been tried.  Additionally, has had sinus pressure behind the right eye and mild headaches across the temples and forehead for the last 2 to 3 weeks.  Sinus pressure seems to vary depending on whether he is lying down or bending forward. No face pain, no nasal congestion or postnasal drip.  No hearing changes or tinnitus.    Past Medical History:  Diagnosis Date   Anxiety    BPH (benign prostatic hypertrophy)    Cataract    artifiicial lenses in both eyes   Cervical spondylosis    with radiculopathy not responsive to conservative therapies:  [facility] 02/2015: ACDF surgery C5-C7 performed 04/05/15   Chronic pain syndrome    cervicalgia, heredetary PN, LBP   COVID-19 virus infection 11/2020   Difficulty controlling anger    + GAD with OCD tendencies + memory changes---consider neuropsych testing   Dysplastic nevus 10/2015   left mid back: Dr. Denna Haggard following   Glaucoma    History of adenomatous polyp of colon    IBS (irritable bowel syndrome)    +constipation   Microscopic colitis 06/2019   colonoscopy + 06/2019   Neuropathic pain    Peripheral neuropathy    Idiopathic, inherited per pt's neurologist, Dr. Everette Rank. Various labs  normal 07/2016 except vitamin B6 was siginificantly elevated.  Allergist eval/allergy testing (including food) ALL NORMAL/NEG 07/2016.   Restless legs syndrome 09/27/2014   Throat irritation    chronic (since cervical spine surgery 2016).  Dr. Erik Obey [id number] normal, CT sinuses normal, barium swallow normal, trial of protonix no help.     Past Surgical History:  Procedure Laterality Date   Ant cerv decompression/discectomy w/fusion C 5-7  04/05/15   C5-7 ACDF   CATARACT EXTRACTION Bilateral    COLONOSCOPY  06/29/2019   FOR EVAL OF UNEXPLAINED DIARRHEA-->normal, bx c/w microscopic colitis->uceris and budesonide rx'd by Dr. Silverio Decamp 06/2019   COLONOSCOPY W/ POLYPECTOMY  08/2012; 05/23/16   2017, adenomatous polyp--recall 5 yrs.   EYE SURGERY Right 09/06/2020   TRABECULECTOMY Bilateral    for uncontrolled glaucoma    TRANSURETHRAL RESECTION OF PROSTATE  2008   due to enlarged prostate   VASECTOMY  2001    Outpatient Medications Prior to Visit  Medication Sig Dispense Refill   Alpha-Lipoic Acid 300 MG CAPS Take 1 capsule by mouth 2 (two) times daily.     clonazePAM (KLONOPIN) 0.5 MG tablet 1-2 tabs po bid 120 tablet 2   dorzolamide-timolol (COSOPT) 22.3-6.8 MG/ML ophthalmic solution Place 1 drop into the right eye 2 (two) times daily.     gabapentin (NEURONTIN) 600 MG tablet TAKE ONE AND ONE-HALF TABLETS IN THE MORNING AND ONE AND ONE-HALF TABLETS IN THE EVENING  HYDROcodone-acetaminophen (NORCO) 7.5-325 MG tablet Take 1 tablet by mouth every 6 (six) hours as needed for moderate pain. 30 tablet 0   HYDROcodone-acetaminophen (NORCO/VICODIN) 5-325 MG tablet Take 1-2 tablets by mouth every 6 (six) hours as needed for moderate pain. 30 tablet 0   Magnesium 300 MG CAPS Take by mouth as needed.      Omega-3 Fatty Acids (OMEGA-3 FISH OIL) 300 MG CAPS Take 1 capsule by mouth daily.     polyethylene glycol powder (GLYCOLAX/MIRALAX) 17 GM/SCOOP powder Take by mouth.     sertraline  (ZOLOFT) 100 MG tablet TAKE 1.5 TABLETS BY MOUTH EVERY DAY (Patient taking differently: TAKE 1.5 TABLETS BY MOUTH EVERY . Pt taking 0.5 tabs and weaning off) 135 tablet 4   sildenafil (REVATIO) 20 MG tablet 1-5 tabs po qd prn intercourse 30 tablet 11   timolol (TIMOPTIC) 0.5 % ophthalmic solution SMARTSIG:In Eye(s)     No facility-administered medications prior to visit.    No Known Allergies  ROS As per HPI  PE:    02/21/2022    3:06 PM 01/24/2022    2:59 PM 01/01/2022    9:06 AM  Vitals with BMI  Height '6\' 1"'$  '6\' 1"'$  '6\' 1"'$   Weight 168 lbs 3 oz 167 lbs 13 oz 172 lbs 3 oz  BMI 22.2 03.47 42.59  Systolic 563 97 875  Diastolic 71 60 73  Pulse 66 63 54     Physical Exam  Gen: Alert, well appearing.  Patient is oriented to person, place, time, and situation.. AFFECT: pleasant, lucid thought and speech. IEP:PIRJ: no injection, icteris, swelling, or exudate.  EOMI, PERRLA. Mouth: lips without lesion/swelling.  Oral mucosa pink and moist. Oropharynx without erythema, exudate, or swelling.  No tenderness to palpation of the sinuses or temples. Right elbow with tenderness over lateral epicondyle.  His pain is reproduced with resisted wrist extension and slightly reproduced with resisted supination. No elbow swelling or limitation of range of motion.    LABS:  Last CBC Lab Results  Component Value Date   WBC 4.0 01/24/2022   HGB 15.3 01/24/2022   HCT 44.6 01/24/2022   MCV 89.6 01/24/2022   MCH 30.7 09/27/2020   RDW 14.1 01/24/2022   PLT 118.0 (L) 18/84/1660   Last metabolic panel Lab Results  Component Value Date   GLUCOSE 90 01/24/2022   NA 138 01/24/2022   K 4.1 01/24/2022   CL 102 01/24/2022   CO2 28 01/24/2022   BUN 23 01/24/2022   CREATININE 1.11 01/24/2022   CALCIUM 9.5 01/24/2022   PROT 6.7 01/24/2022   ALBUMIN 4.5 01/24/2022   BILITOT 0.6 01/24/2022   ALKPHOS 69 01/24/2022   AST 26 01/24/2022   ALT 25 01/24/2022   IMPRESSION AND PLAN:  #1 mild  thrombocytopenia. 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  Discussed diagnosis and plan of monitoring for any trend downward. CBC today.  #2 right lateral epicondylitis. (Bedside ultrasound today showed focal, distinct area of anechoic disruption of fibrillar pattern in the common extensor tendon just proximal to its insertion.  No hyperemia.  No tendon thickening or calcifications.  No elbow effusion.  #3 right sinus pressure, mild headaches. Unknown etiology.  Exam normal today.  Reassured.  An After Visit Summary was printed and given to the patient.  FOLLOW UP: No follow-ups on file.  Signed:  Crissie Sickles, MD           02/21/2022

## 2022-02-22 LAB — CBC
HCT: 46.1 % (ref 39.0–52.0)
Hemoglobin: 15.6 g/dL (ref 13.0–17.0)
MCHC: 33.9 g/dL (ref 30.0–36.0)
MCV: 90.2 fl (ref 78.0–100.0)
Platelets: 149 10*3/uL — ABNORMAL LOW (ref 150.0–400.0)
RBC: 5.11 Mil/uL (ref 4.22–5.81)
RDW: 14.8 % (ref 11.5–15.5)
WBC: 5.7 10*3/uL (ref 4.0–10.5)

## 2022-02-27 ENCOUNTER — Ambulatory Visit (INDEPENDENT_AMBULATORY_CARE_PROVIDER_SITE_OTHER): Payer: 59 | Admitting: Psychiatry

## 2022-02-27 DIAGNOSIS — F411 Generalized anxiety disorder: Secondary | ICD-10-CM | POA: Diagnosis not present

## 2022-02-27 NOTE — Progress Notes (Signed)
[facility] Counselor/Therapist Progress Note  Patient ID: Keith Mckee, MRN: [id number],    Date: 02/27/2022  Time Spent: 55 minutes   Treatment Type: Individual Therapy  Reported Symptoms: anxiety, "not as much anger", difficulty with control issues and trying to interrupt my anger and frustration "but it's hard"  Mental Status Exam:  Appearance:   Casual     Behavior:  Appropriate, Sharing, and Motivated  Motor:  Normal  Speech/Language:   Clear and Coherent  Affect:  anxious  Mood:  anxious and some irritability  Thought process:  goal directed  Thought content:    WNL  Sensory/Perceptual disturbances:    WNL  Orientation:  oriented to person, place, time/date, situation, day of week, month of year, year, and stated date of Sept. 6, 2023  Attention:  Good  Concentration:  Good  Memory:  WNL  Fund of knowledge:   Good  Insight:    Good and Fair  Judgment:   Good and Fair  Impulse Control:  Good and Fair   Risk Assessment: Danger to Self:  No Self-injurious Behavior: No Danger to Others: No Duty to Warn:no Physical Aggression / Violence:No  Access to Firearms a concern: No  Gang Involvement:No   Subjective: Patient today reporting anxiety, frustrations, and issues with schizophrenic adult daughter. Anger "not quite as bad". Daughter "[name]" age 59, diagnosed in early 20's with schizophrenia and now living with mom, who has cancer and expected to live 6-12 months. Hard for daughter to maintain a job. Patient realizing he needs to be more involved especially in helping when mom's cancer progresses and passes away. Feeling some guilt about not letting daughter live with him as "I'm too old to babysit and she won't follow rules. Processing a lot of feelings and mixed feelings are easy family history and including guilt of wishing I could do more but can't financially. Is trying to find a place for daughter to eventually live; a group setting that might be  appropriate.  Working on being more patient especially when things go in a negative direction or do not go as planned.  Trying to be more patient in traffic on the road, and patient with other people.  More specific today in talking about his anger and hard for him to follow through on strategies.  Looking at connections between thinking and behavior. Patient recognizing that he has made some progress over the years but more where of the issues of anxiety, anger, and frustration that he is trying to work through and resolve.   Interventions: Cognitive Behavioral Therapy and Ego-Supportive  Treatment goal plan: Patient not signing treatment goal plan on computer screen due to COVID.  He collaborated with this therapist on the formulation of his treatment plan and is in agreement with treatment goals. Treatment goals: Treatment goals remain on treatment plan as patient works with strategies to achieve his goals.  Progress is assessed each session and noted in these "subject"/"plan" section of treatment note. Long-term goal: Reduce overall level, frequency, and intensity of the anxiety so that daily functioning is not impaired. Short-term goal: Increase understanding of beliefs and messages that produce the worry and anxiety.  Work to change those beliefs and messages to healthier beliefs that can encourage patient and do not produce worry and anxiety. Strategies: Identify and used specific coping strategies for anxiety reduction.   Diagnosis:   ICD-10-CM   1. Generalized anxiety disorder  F41.1      Plan:  Patient in  today with motivation and active participation in session acknowledging making changes is hard.  Admits he has long-term habits that he is needing to change regarding his feelings of anxiety, frustration, and anger, and also towards his divorced wife who is currently diagnosed with colon cancer and with whom they are a 4 year old daughter with schizophrenia and is living.  It was  obvious as patient talked further today that he really needed to spend more time also on issues he encounters with his adult daughter who lives with schizophrenia.  Openly expressed his concerns as well as frustrations in that situation and also anticipating decisions he is going to need to make, as referenced in "subjective" section above.  Frustration and some older anger coming out in session today which seemed to be a good thing as he needs to to talk it out more and not hold it inside, and in order to move forward eventually.  Is making some progress and needs to continue in therapy to focus on goal directed behaviors, unraveling from some of his past in order to move forward, and to feel better about himself and how he can handle situations in the present and into the future better than what he feels he has done in the past. Encouraged patient and his practicing more positive behaviors as discussed in session including: Positive/encouraging self talk, staying in the present versus the past or too far into the future, practicing self calming behaviors as discussed in session, realize the strength he shows working with goal directed behaviors to move in a direction that supports his improved emotional health and management of anxiety/anger/frustration in healthier ways.  Goal review and progress/challenges noted with patient.  Next appointment within 2 to 3 weeks.  This record has been created using [employer].  Chart creation errors have been sought, but may not always have been located and corrected.  Such creation errors do not reflect on the standard of medical care provided.   Shanon Ace, LCSW

## 2022-03-07 ENCOUNTER — Other Ambulatory Visit: Payer: Self-pay | Admitting: Family Medicine

## 2022-03-07 MED ORDER — HYDROCODONE-ACETAMINOPHEN 7.5-325 MG PO TABS
1.0000 | ORAL_TABLET | Freq: Four times a day (QID) | ORAL | 0 refills | Status: DC | PRN
Start: 2022-03-07 — End: 2022-07-13

## 2022-03-07 NOTE — Telephone Encounter (Signed)
Requesting: Norco  Contract:  04/04/21 UDS: 10/02/21 Last Visit: 02/21/22 Next Visit: 05/09/22 Last Refill: 09/14/21(30,0)  Please Advise. Med pending

## 2022-03-07 NOTE — Telephone Encounter (Signed)
Pt advised refill sent via mychart.

## 2022-03-13 ENCOUNTER — Ambulatory Visit (INDEPENDENT_AMBULATORY_CARE_PROVIDER_SITE_OTHER): Payer: 59 | Admitting: Psychiatry

## 2022-03-13 DIAGNOSIS — F411 Generalized anxiety disorder: Secondary | ICD-10-CM

## 2022-03-13 NOTE — Progress Notes (Signed)
[facility] Counselor/Therapist Progress Note  Patient ID: Sajjad Honea, MRN: [id number],    Date: 03/13/2022  Time Spent: 55 minutes   Treatment Type: Individual Therapy  Reported Symptoms: anxiety, some improvement "with my anger", hard to manage things out of my control, frustration  Mental Status Exam:  Appearance:   Casual     Behavior:  Appropriate, Sharing, and Motivated  Motor:  Normal  Speech/Language:   Clear and Coherent  Affect:  anxious  Mood:  anxious  Thought process:  goal directed  Thought content:    WNL  Sensory/Perceptual disturbances:    WNL  Orientation:  oriented to person, place, time/date, situation, day of week, month of year, year, and stated date of Sept. 20, 2023  Attention:  Good  Concentration:  Good  Memory:  WNL  Fund of knowledge:   Good  Insight:    Good  Judgment:   Good most of the time except when my impulsivity "gets the best of me"  Impulse Control:  Good and Fair   Risk Assessment: Danger to Self:  No Self-injurious Behavior: No Danger to Others: No Duty to Warn:no Physical Aggression / Violence:No  Access to Firearms a concern: No  Gang Involvement:No   Subjective:  Patient in today reporting anxiety, some impulsivity, frustration, and lots of difficulty coping with "things out of my control." Very challenging right now in relationship and trying to help his schizophrenic 59 yr old daughter. Needed session today to more fully process his thoughts/feelings/fears/anxieties re: his daughter and how much this concerns him. Discussed his need to have some healthy boundaries and also positive self-talk and self-care, refraining from overly focusing on the negatives. Did well today in addressing stressors and especially as it relates to "things not going as expected" and how to adjust and not let things trip him up. Mentioned at session end, some guilt issues related to daughter and we agreed to follow up on this next session.   Continues to try and be more patient with himself as he is working to better manage when things do not go as planned or go in a negative direction.  Is showing some gradual improvement in his level of tolerance and patience when confronting those situations, and realizes he has struggled with this for many years but trying not to let that affect him in negative ways now.  Looking more at connections between his thinking and his behavior.  Does recognize some progress with his anxiety, anger, and frustration and how he manages them.  Interventions: Cognitive Behavioral Therapy and Ego-Supportive  Treatment goal plan: Patient not signing treatment goal plan on computer screen due to COVID.  He collaborated with this therapist on the formulation of his treatment plan and is in agreement with treatment goals. Treatment goals: Treatment goals remain on treatment plan as patient works with strategies to achieve his goals.  Progress is assessed each session and noted in these "subject"/"plan" section of treatment note. Long-term goal: Reduce overall level, frequency, and intensity of the anxiety so that daily functioning is not impaired. Short-term goal: Increase understanding of beliefs and messages that produce the worry and anxiety.  Work to change those beliefs and messages to healthier beliefs that can encourage patient and do not produce worry and anxiety. Strategies: Identify and used specific coping strategies for anxiety reduction  Diagnosis:   ICD-10-CM   1. Generalized anxiety disorder  F41.1      Plan:   Patient in today showing  motivation and active participation in session as he continues to work on making changes within himself and his ways of coping with various stressors, which he acknowledges is quite hard.  Needed most of session as mentioned above to work on some specific issues regarding his thoughts/feelings/fears/anxieties regarding his adult daughter who is schizophrenic.  There  are many challenges in his relationship with her however he is her most supportive person currently.  Patient's ex-wife, who is the mother of his daughter, is not able to do a lot due to her declining health with terminal illness.  Lots of challenges in the situation that patient discussed today and he did seem to be more open and trying strategies and in looking for options that maybe he has not tried previously to manage his anxiety and stress and also handle situations with his adult daughter more effectively.  Still acknowledging that he has had very long-term habits of difficulty with frustration/anger/anxiety and his reactions to these, but does seem to be gradually putting forth more effort in therapy and seeing how several things from his past are linked to current symptoms.  Not holding his feelings inside quite as much and being able to vent more in sessions, is helpful.  Is making progress and needs continued therapy to work further with goal directed behaviors for him to move forward in a positive and healthier direction.  Encouraged patient in his practice of more positive behaviors including: Staying in the present versus the past or too far into the future, practice self calming behaviors as discussed in session, positive/encouraging self talk, realize the strength he shows working with goal directed behaviors to move in a direction that supports his improved emotional health, management of anxiety/anger/frustration in healthier ways, and improve his overall outlook.  Goal review and progress/challenges noted with patient.  Next appointment within 2 to 3 weeks.  This record has been created using [employer].  Chart creation errors have been sought, but may not always have been located and corrected.  Such creation errors do not reflect on the standard of medical care provided.   Shanon Ace, LCSW

## 2022-03-15 ENCOUNTER — Encounter: Payer: Self-pay | Admitting: Family Medicine

## 2022-03-15 ENCOUNTER — Ambulatory Visit (INDEPENDENT_AMBULATORY_CARE_PROVIDER_SITE_OTHER): Payer: 59 | Admitting: Family Medicine

## 2022-03-15 VITALS — BP 92/54 | HR 60 | Temp 98.0°F | Ht 73.0 in | Wt 168.0 lb

## 2022-03-15 DIAGNOSIS — R5383 Other fatigue: Secondary | ICD-10-CM | POA: Diagnosis not present

## 2022-03-15 DIAGNOSIS — R52 Pain, unspecified: Secondary | ICD-10-CM

## 2022-03-15 DIAGNOSIS — I959 Hypotension, unspecified: Secondary | ICD-10-CM

## 2022-03-15 DIAGNOSIS — R6883 Chills (without fever): Secondary | ICD-10-CM

## 2022-03-15 DIAGNOSIS — R519 Headache, unspecified: Secondary | ICD-10-CM | POA: Diagnosis not present

## 2022-03-15 LAB — CORTISOL: Cortisol, Plasma: 7.3 ug/dL

## 2022-03-15 LAB — TSH: TSH: 2.11 u[IU]/mL (ref 0.35–5.50)

## 2022-03-15 LAB — COMPREHENSIVE METABOLIC PANEL
ALT: 15 U/L (ref 0–53)
AST: 14 U/L (ref 0–37)
Albumin: 4.4 g/dL (ref 3.5–5.2)
Alkaline Phosphatase: 74 U/L (ref 39–117)
BUN: 17 mg/dL (ref 6–23)
CO2: 31 mEq/L (ref 19–32)
Calcium: 9.7 mg/dL (ref 8.4–10.5)
Chloride: 103 mEq/L (ref 96–112)
Creatinine, Ser: 1.02 mg/dL (ref 0.40–1.50)
GFR: 80.39 mL/min (ref >60.00)
Glucose, Bld: 59 mg/dL — ABNORMAL LOW (ref 70–99)
Potassium: 4.1 mEq/L (ref 3.5–5.1)
Sodium: 142 mEq/L (ref 135–145)
Total Bilirubin: 0.7 mg/dL (ref 0.2–1.2)
Total Protein: 6.9 g/dL (ref 6.0–8.3)

## 2022-03-15 LAB — CBC WITH DIFFERENTIAL/PLATELET
Basophils Absolute: 0 10*3/uL (ref 0.0–0.1)
Basophils Relative: 0.5 % (ref 0.0–3.0)
Eosinophils Absolute: 0.1 10*3/uL (ref 0.0–0.7)
Eosinophils Relative: 2.9 % (ref 0.0–5.0)
HCT: 47.1 % (ref 39.0–52.0)
Hemoglobin: 15.9 g/dL (ref 13.0–17.0)
Lymphocytes Relative: 38.3 % (ref 12.0–46.0)
Lymphs Abs: 2 10*3/uL (ref 0.7–4.0)
MCHC: 33.8 g/dL (ref 30.0–36.0)
MCV: 90.3 fl (ref 78.0–100.0)
Monocytes Absolute: 0.4 10*3/uL (ref 0.1–1.0)
Monocytes Relative: 7 % (ref 3.0–12.0)
Neutro Abs: 2.6 10*3/uL (ref 1.4–7.7)
Neutrophils Relative %: 51.3 % (ref 43.0–77.0)
Platelets: 164 10*3/uL (ref 150.0–400.0)
RBC: 5.21 Mil/uL (ref 4.22–5.81)
RDW: 13.9 % (ref 11.5–15.5)
WBC: 5.1 10*3/uL (ref 4.0–10.5)

## 2022-03-15 LAB — C-REACTIVE PROTEIN: CRP: <1 mg/dL (ref 0.5–20.0)

## 2022-03-15 LAB — SEDIMENTATION RATE: Sed Rate: 2 mm/hr (ref 0–20)

## 2022-03-15 LAB — CK: Total CK: 112 U/L (ref 7–232)

## 2022-03-15 LAB — URIC ACID: Uric Acid, Serum: 5.9 mg/dL (ref 4.0–7.8)

## 2022-03-15 MED ORDER — DOXYCYCLINE HYCLATE 100 MG PO CAPS: 100.0000 mg | ORAL_CAPSULE | Freq: Two times a day (BID) | ORAL | 0 refills | Status: AC

## 2022-03-15 NOTE — Progress Notes (Signed)
OFFICE VISIT  03/15/2022  CC:  Chief Complaint  Patient presents with   Headache    Pt c/o HA, bodyaches, dizziness, joint pain x 2 mos;    Patient is a 61 y.o. male who presents for body aches.  HPI: 2 mo hx of progressively worsening feeling of fatigue, malaise, roving muscle and joint aches, HA's.  Lately a couple episodes of dizziness.  BP a little lower than usual at home lately. The pain is not sharp and they don't last long in any one area. No rash.  No fevers.  No wt loss.  Appetite is good. Stools sometimes loose but no signif change from his usual. No n/v.  No urinary symptoms.  No nasal sx's, no ST, no cough.  No CP or SOB. He has some neck pain mainly L side, rad to L trap.  Feels tight. No LB or glut/SI pain.  He has chronic burning neuropathic pain in LL's---unchanged.  Lots of tick exposure. See my note from 01/24/22: lyme serology neg, CBC nl except mild thrombocytopenia, nl cmet,    Past Medical History:  Diagnosis Date   Anxiety    BPH (benign prostatic hypertrophy)    Cataract    artifiicial lenses in both eyes   Cervical spondylosis    with radiculopathy not responsive to conservative therapies:  [facility] 02/[facility]: ACDF surgery C5-C7 performed 04/05/15   Chronic pain syndrome    cervicalgia, heredetary PN, LBP   COVID-19 virus infection 11/2020   Difficulty controlling anger    + GAD with OCD tendencies + memory changes---consider neuropsych testing   Dysplastic nevus 10/2015   left mid back: Dr. Denna Haggard following   Glaucoma    History of adenomatous polyp of colon    IBS (irritable bowel syndrome)    +constipation   Microscopic colitis 06/2019   colonoscopy + 06/2019   Neuropathic pain    Peripheral neuropathy    Idiopathic, inherited per pt's neurologist, Dr. Everette Rank. Various labs normal 07/2016 except vitamin B6 was siginificantly elevated.  Allergist eval/allergy testing (including food) ALL NORMAL/NEG 07/2016.   Restless legs syndrome  09/27/2014   Throat irritation    chronic (since cervical spine surgery 2016).  Dr. Erik Obey [id number]->[id number] normal, CT sinuses normal, barium swallow normal, trial of protonix no help.     Past Surgical History:  Procedure Laterality Date   Ant cerv decompression/discectomy w/fusion C 5-7  04/05/15   C5-7 ACDF   CATARACT EXTRACTION Bilateral    COLONOSCOPY  06/29/2019   FOR EVAL OF UNEXPLAINED DIARRHEA-->normal, bx c/w microscopic colitis->uceris and budesonide rx'd by Dr. Silverio Decamp 06/2019   COLONOSCOPY W/ POLYPECTOMY  08/2012; 05/23/16   2017, adenomatous polyp--recall 5 yrs.   EYE SURGERY Right 09/06/2020   TRABECULECTOMY Bilateral    for uncontrolled glaucoma    TRANSURETHRAL RESECTION OF PROSTATE  2008   due to enlarged prostate   VASECTOMY  2001    Outpatient Medications Prior to Visit  Medication Sig Dispense Refill   Alpha-Lipoic Acid 300 MG CAPS Take 1 capsule by mouth 2 (two) times daily.     clonazePAM (KLONOPIN) 0.5 MG tablet 1-2 tabs po bid 120 tablet 2   dorzolamide-timolol (COSOPT) 22.3-6.8 MG/ML ophthalmic solution Place 1 drop into the right eye 2 (two) times daily.     gabapentin (NEURONTIN) 600 MG tablet TAKE ONE AND ONE-HALF TABLETS IN THE MORNING AND ONE AND ONE-HALF TABLETS IN THE EVENING     HYDROcodone-acetaminophen (NORCO) 7.5-325 MG tablet Take 1  tablet by mouth every 6 (six) hours as needed for moderate pain. 30 tablet 0   HYDROcodone-acetaminophen (NORCO/VICODIN) 5-325 MG tablet Take 1-2 tablets by mouth every 6 (six) hours as needed for moderate pain. 30 tablet 0   Magnesium 300 MG CAPS Take by mouth as needed.      Omega-3 Fatty Acids (OMEGA-3 FISH OIL) 300 MG CAPS Take 1 capsule by mouth daily.     polyethylene glycol powder (GLYCOLAX/MIRALAX) 17 GM/SCOOP powder Take by mouth.     sildenafil (REVATIO) 20 MG tablet 1-5 tabs po qd prn intercourse 30 tablet 11   timolol (TIMOPTIC) 0.5 % ophthalmic solution SMARTSIG:In Eye(s)     sertraline (ZOLOFT) 100 MG  tablet TAKE 1.5 TABLETS BY MOUTH EVERY DAY (Patient not taking: Reported on 03/15/2022) 135 tablet 4   No facility-administered medications prior to visit.    No Known Allergies  ROS As per HPI  PE:    03/15/2022    1:30 PM 02/21/2022    3:06 PM 01/24/2022    2:59 PM  Vitals with BMI  Height _0  _1  _2   Weight 168 lbs 168 lbs 3 oz 167 lbs 13 oz  BMI 22.17 03.5 00.93  Systolic 92 818 97  Diastolic 54 71 60  Pulse 60 66 63     Physical Exam  Gen: Alert, well appearing.  Patient is oriented to person, place, time, and situation.. AFFECT: pleasant, lucid thought and speech. EXH:BZJI: no injection, icteris, swelling, or exudate.  EOMI, PERRLA. Mouth: lips without lesion/swelling.  Oral mucosa pink and moist. Oropharynx without erythema, exudate, or swelling.  Neck - No masses or thyromegaly or limitation in range of motion CV: RRR, no m/r/g.   LUNGS: CTA bilat, nonlabored resps, good aeration in all lung fields. EXT: no clubbing or cyanosis.  no edema.  No tenderness of any joints or muscles. No joint swelling or erythema.  All ROM normal. SKIN: No rash.  LABS:  Last CBC Lab Results  Component Value Date   WBC 5.7 02/21/2022   HGB 15.6 02/21/2022   HCT 46.1 02/21/2022   MCV 90.2 02/21/2022   MCH 30.7 09/27/2020   RDW 14.8 02/21/2022   PLT 149.0 (L) 96/78/9381   Last metabolic panel Lab Results  Component Value Date   GLUCOSE 90 01/24/2022   NA 138 01/24/2022   K 4.1 01/24/2022   CL 102 01/24/2022   CO2 28 01/24/2022   BUN 23 01/24/2022   CREATININE 1.11 01/24/2022   CALCIUM 9.5 01/24/2022   PROT 6.7 01/24/2022   ALBUMIN 4.5 01/24/2022   BILITOT 0.6 01/24/2022   ALKPHOS 69 01/24/2022   AST 26 01/24/2022   ALT 25 01/24/2022   Last thyroid functions Lab Results  Component Value Date   TSH 2.68 10/02/2021   Lab Results  Component Value Date   ESRSEDRATE 3 01/24/2022   IMPRESSION AND PLAN:  1) Roving body aches, fatigue, headaches.  Extensive  tick bite history.   Normal physical exam. Will expand lab evaluation:  CBC, c-Met, TSH, Lyme serology with reflex Western blot,  ANA panel, CPK, ESR, CRP, cortisol, uric acid. Doxycycline 100 mg twice daily x3 weeks prescribed today.  #2 low blood pressure. He tends to run a low normal blood pressure but feels like it has been lower lately than usual. Hydration status seems fine. Check cortisol today.  An After Visit Summary was printed and given to the patient.  FOLLOW UP: Return in about 3 weeks (around 04/05/2022)  for f/u aches.  Signed:  Crissie Sickles, MD           03/15/2022

## 2022-03-16 LAB — LYME DISEASE SEROLOGY W/REFLEX: Lyme Total Antibody EIA: NEGATIVE

## 2022-03-26 LAB — ANA SCREEN,IFA,REFLEX TITER/PATTERN,REFLEX MPLX 11 AB CASCADE
14-3-3 eta Protein: <0.2 ng/mL (ref <0.2)
Anti Nuclear Antibody (ANA): NEGATIVE
Cyclic Citrullin Peptide Ab: <16 UNITS
Rheumatoid fact SerPl-aCnc: <14 IU/mL (ref <14)

## 2022-03-27 ENCOUNTER — Ambulatory Visit (INDEPENDENT_AMBULATORY_CARE_PROVIDER_SITE_OTHER): Payer: 59 | Admitting: Psychiatry

## 2022-03-27 DIAGNOSIS — F411 Generalized anxiety disorder: Secondary | ICD-10-CM | POA: Diagnosis not present

## 2022-03-27 NOTE — Progress Notes (Signed)
[facility] Counselor/Therapist Progress Note  Patient ID: Keith Mckee, MRN: [id number],    Date: 03/27/2022  Time Spent: 55 minutes   Treatment Type: Individual Therapy  Reported Symptoms: anxiety, anger, depression  Mental Status Exam:  Appearance:   Casual     Behavior:  Appropriate, Sharing, and some agitation at times in talking  Motor:  Normal  Speech/Language:   Clear and Coherent  Affect:  Anxious, some depression  Mood:  anxious and some depression  Thought process:  normal  Thought content:    WNL  Sensory/Perceptual disturbances:    WNL  Orientation:  oriented to person, place, time/date, situation, day of week, month of year, year, and stated date of Oct. 4, 20223  Attention:  Good  Concentration:  Good  Memory:  WNL  Fund of knowledge:   Good  Insight:    Good and Fair  Judgment:   Good  Impulse Control:  Good   Risk Assessment: Danger to Self:  No Self-injurious Behavior: No Danger to Others: No Duty to Warn:no Physical Aggression / Violence:No  Access to Firearms a concern: No  Gang Involvement:No   Subjective:  Patient in today and reports symptoms of anxiety, frustration, anger, some impulsiveness, and continued difficulty in coping with things out of his control. Lot of anger and had a bad week. My problem is I take it out by throwing things,curse, rant and rave, breaks things but doesn't physically hurt people.  Past  week had the flu, adult daughter continually frustrates him. "3 things:  I wish I'd never asked by former wife out and got married, I wish I never married her but she was pregnant, wished I had asked for custody of daughter in earlier yrs after wife had DUI. Lots of regrets. Anger continues and has some difficulties as we tried to talk through some of his patterns of anger today.  States at times that he does not know why he is so angry and has been angry a long time.  Ambivalent about changes or what he can change.  Tried to talk  with him more in terms of some history of anger within himself, within the family, and ways he has tried to manage anger in the past.  Has experienced several changes and rearranges in life that were different than what he had hoped for.  Seems that his anger is related to first marriage have a lot of issues that occurred then and more currently with first wife's illness, history of being angry as a child but not seeming to have any thoughts or memories that help him understand that, definite anger with adult daughter (schizophrenic) situation and how she makes it difficult for patient to help her, and his "working all the time and rarely taking time off".  Excessive working with little time off, the way patient described it, certainly sounded like a way some of his anger has emerged.  He plans to give this more thought between sessions and we will pick up on this and work further next session.  Also encouraged him in the meantime to step back from work, even if he did not travel, but to take some time for himself and for himself and his current wife.  Encouraged his refraining from overly focusing on just the negatives, to include positive self-care and self-talk, have healthy boundaries, letting go of guilt perhaps related to adult daughter, paying attention to connections between his thoughts and behavior, trying to better manage when "  things do not go as expected", and having more patience with himself.   Interventions: Cognitive Behavioral Therapy, Solution-Oriented/Positive Psychology, and Ego-Supportive  Treatment goal plan: Patient not signing treatment goal plan on computer screen due to [place].  He collaborated with this therapist on the formulation of his treatment plan and is in agreement with treatment goals. Treatment goals: Treatment goals remain on treatment plan as patient works with strategies to achieve his goals.  Progress is assessed each session and noted in these "subject"/"plan" section  of treatment note. Long-term goal: Reduce overall level, frequency, and intensity of the anxiety so that daily functioning is not impaired. Short-term goal: Increase understanding of beliefs and messages that produce the worry and anxiety.  Work to change those beliefs and messages to healthier beliefs that can encourage patient and do not produce worry and anxiety. Strategies: Identify and used specific coping strategies for anxiety reduction  Diagnosis:   ICD-10-CM   1. Generalized anxiety disorder  F41.1      Plan: Patient in today motivated and actively participating in session focusing on his anxiety, anger, frustration, some impulsivity, and coping with things "out of my control".  Processed these at length in session today and trying to determine more of what is feeding the anger he speaks about currently as he states that he has no idea why he gets so angry.  Looked at past and current influences and how they could have understandably played a role in his anger earlier on and currently.  Challenged in his coping skills of dealing with anger.  Also looked particularly today at some of his current anger and the possibility of some links to his relationship with adult daughter who is diagnosed with schizophrenia.  Their relationship can be very difficult for patient at times of interacting with her, but states he wants things to be better, understanding he cannot control her.  Does want to help but gets easily frustrated quickly and wanting to be better at managing his thoughts and feelings in that relationship with her.  He does not seem to feel anything in his past is very linked to his anger as we discussed this in session today.  Gust his previously shared tendency to hold his feelings inside and then gets quite angry later, and suggested some allergies to let his anger out in ways that are not destructive so that it does not build up on the inside and come out much more forceful.  Encouraged  patient and practicing more positive behaviors as noted in session including: Remaining in the present versus the past or too far into the future, practicing self calming behaviors as discussed in session, positive/encouraging self talk, recognize the strength he shows when working with goal directed behaviors to move in a direction that supports his improved emotional health, anxiety/anger/frustration management in healthier ways, and improving his overall outlook.  Goal review and progress/challenges noted with patient.  Next appointment within 2 weeks.  This record has been created using [employer].  Chart creation errors have been sought, but may not always have been located and corrected.  Such creation errors do not reflect on the standard of medical care provided.   Shanon Ace, LCSW

## 2022-04-08 ENCOUNTER — Encounter: Payer: Self-pay | Admitting: Family Medicine

## 2022-04-08 DIAGNOSIS — M2550 Pain in unspecified joint: Secondary | ICD-10-CM

## 2022-04-08 DIAGNOSIS — R5382 Chronic fatigue, unspecified: Secondary | ICD-10-CM

## 2022-04-09 NOTE — Telephone Encounter (Signed)
So sorry you are still feeling bad. I will order a rheumatology referral.  You should get contacted to make appointment. Also, sometimes all of these symptoms (except the urinary symptoms) can be attributed to sleep apnea. Further evaluation for this is something to keep in mind.  This would be a sleep specialist with either pulmonology or neurology.

## 2022-04-10 ENCOUNTER — Ambulatory Visit (INDEPENDENT_AMBULATORY_CARE_PROVIDER_SITE_OTHER): Payer: 59 | Admitting: Psychiatry

## 2022-04-10 DIAGNOSIS — F411 Generalized anxiety disorder: Secondary | ICD-10-CM | POA: Diagnosis not present

## 2022-04-10 NOTE — Progress Notes (Deleted)
[facility] Counselor/Therapist Progress Note  Patient ID: Keith Mckee, MRN: [id number],    Date: 04/10/2022  Time Spent: ***   Treatment Type: Individual Therapy  Reported Symptoms: anxiety, anger  Mental Status Exam:  Appearance:   Casual     Behavior:  Appropriate, Sharing, and Motivated  Motor:  Normal  Speech/Language:   Clear and Coherent  Affect:  anxious  Mood:  anxious and irritability   Thought process:  goal directed  Thought content:    WNL  Sensory/Perceptual disturbances:    WNL  Orientation:  oriented to person, place, time/date, situation, day of week, month of year, year, and stated date of Oct. 18, 2023  Attention:  Good  Concentration:  Good  Memory:  WNL  Fund of knowledge:   Good  Insight:    Good and Fair  Judgment:   Good  Impulse Control:  Fair   Risk Assessment: Danger to Self:  No Self-injurious Behavior: No Danger to Others: No Duty to Warn:no Physical Aggression / Violence:No  Access to Firearms a concern: No  Gang Involvement:No   Subjective:  Patient in today reporting anxiety, anger, frustration, impulsiveness at times and continues to find it difficult to cope with things that are out of his control. "Past few days since we talked last have been better." Former wife with cancer not expected to live much longer, and patient needing to help daughter "[name]" who lives with schiophrenia find appropriate housing. He and daughter "don't always get along" so things can be stressful. Frustrated, angry at times as he does try to help her. He is to go with adult daughter to her first appt with new psychiatrist today in [place]. Describes daughter as selfish 59 yr old daughter.Needed session today to work on a lot of frustration he is feeling in trying to help daughter. Looked at anger and some early origins of it with wife at time of very stressed feeling and relationship. States part of my anger may be related to stopping his medication  Sertraline prescribed by PCP. Also felt it took away all my feelings.      Patient in today and reports symptoms of anxiety, frustration, anger, some impulsiveness, and continued difficulty in coping with things out of his control. Lot of anger and had a bad week. My problem is I take it out by throwing things,curse, rant and rave, breaks things but doesn't physically hurt people.  Past  week had the flu, adult daughter continually frustrates him. "3 things:  I wish I'd never asked by former wife out and got married, I wish I never married her but she was pregnant, wished I had asked for custody of daughter in earlier yrs after wife had DUI. Lots of regrets. Anger continues and has some difficulties as we tried to talk through some of his patterns of anger today.  States at times that he does not know why he is so angry and has been angry a long time.  Ambivalent about changes or what he can change.  Tried to talk with him more in terms of some history of anger within himself, within the family, and ways he has tried to manage anger in the past.  Has experienced several changes and rearranges in life that were different than what he had hoped for.  Seems that his anger is related to first marriage have a lot of issues that occurred then and more currently with first wife's illness, history of being angry  as a child but not seeming to have any thoughts or memories that help him understand that, definite anger with adult daughter (schizophrenic) situation and how she makes it difficult for patient to help her, and his "working all the time and rarely taking time off".  Excessive working with little time off, the way patient described it, certainly sounded like a way some of his anger has emerged.  He plans to give this more thought between sessions and we will pick up on this and work further next session.  Also encouraged him in the meantime to step back from work, even if he did not travel, but to take some time  for himself and for himself and his current wife.  Encouraged his refraining from overly focusing on just the negatives, to include positive self-care and self-talk, have healthy boundaries, letting go of guilt perhaps related to adult daughter, paying attention to connections between his thoughts and behavior, trying to better manage when "things do not go as expected", and having more patience with himself.   Interventions: Cognitive Behavioral Therapy, Solution-Oriented/Positive Psychology, and Ego-Supportive  Treatment goal plan: Patient not signing treatment goal plan on computer screen due to [place].  He collaborated with this therapist on the formulation of his treatment plan and is in agreement with treatment goals. Treatment goals: Treatment goals remain on treatment plan as patient works with strategies to achieve his goals.  Progress is assessed each session and noted in these "subject"/"plan" section of treatment note. Long-term goal: Reduce overall level, frequency, and intensity of the anxiety so that daily functioning is not impaired. Short-term goal: Increase understanding of beliefs and messages that produce the worry and anxiety.  Work to change those beliefs and messages to healthier beliefs that can encourage patient and do not produce worry and anxiety. Strategies: Identify and used specific coping strategies for anxiety reduction  Diagnosis:   ICD-10-CM   1. Generalized anxiety disorder  F41.1       Plan:     Patient in today showing good participation and motivation in session working on    on his anxiety, anger, frustration, some impulsivity, and coping with things "out of my control".  Processed these at length in session today and trying to determine more of what is feeding the anger he speaks about currently as he states that he has no idea why he gets so angry.    Looked at past and current influences and how they could have understandably played a role in his  anger earlier on and currently.  Challenged in his coping skills of dealing with anger.  Also looked particularly today at some of his current anger and the possibility of some links to his relationship with adult daughter who is diagnosed with schizophrenia.  Their relationship can be very difficult for patient at times of interacting with her, but states he wants things to be better, understanding he cannot control her.    Does want to help but gets easily frustrated quickly and wanting to be better at managing his thoughts and feelings in that relationship with her.  He does not seem to feel anything in his past is very linked to his anger as we discussed this in session today.     //////////////      shared tendency to hold his feelings inside and then gets quite angry later, and suggested some allergies to let his anger out in ways that are not destructive so that it does not build up  on the inside and come out much more forceful.        ////////////////////////////////////////////////////////////////////////////////////////////////////////////////////////////////   Encouraged patient in practicing positive behaviors as noted in session including: Remaining in the present versus the past were too far into the future, practicing self calming behaviors, positive/encouraging self talk, work to better manage anxiety/anger/frustration in healthier ways, and recognize the strength he shows when working with goal-directed behaviors to move in a direction that supports his improved emotional health and overall outlook.  Goal review and progress/challenges noted with patient.  Next appointment within 2 to 3 weeks.  This record has been created using [employer].  Chart creation errors have been sought, but may not always have been located and corrected.  Such creation errors do not reflect on the standard of medical care provided.   Shanon Ace, LCSW

## 2022-04-10 NOTE — Progress Notes (Signed)
[facility] Counselor/Therapist Progress Note  Patient ID: Keith Mckee, MRN: [id number],    Date: 04/10/2022  Time Spent: 55 minutes   Treatment Type: Individual Therapy  Reported Symptoms: anxiety, anger  Mental Status Exam:  Appearance:   Casual     Behavior:  Appropriate, Sharing, and Motivated  Motor:  Normal  Speech/Language:   Clear and Coherent  Affect:  anxious  Mood:  anxious and irritability   Thought process:  goal directed  Thought content:    WNL  Sensory/Perceptual disturbances:    WNL  Orientation:  oriented to person, place, time/date, situation, day of week, month of year, year, and stated date of Oct. 18, 2023  Attention:  Good  Concentration:  Good  Memory:  WNL  Fund of knowledge:   Good  Insight:    Good and Fair  Judgment:   Good  Impulse Control:  Fair   Risk Assessment: Danger to Self:  No Self-injurious Behavior: No Danger to Others: No Duty to Warn:no Physical Aggression / Violence:No  Access to Firearms a concern: No  Gang Involvement:No   Subjective:  Patient in today reporting anxiety, anger, frustration, impulsiveness at times and continues to find it difficult to cope with things that are out of his control. "Past few days since we talked last have been better." Former wife with cancer not expected to live much longer, and patient needing to help daughter "Keith Mckee" who lives with schiophrenia find appropriate housing. He and daughter "don't always get along" so things can be stressful. Frustrated, angry at times as he does try to help her. He is to go with adult daughter to her first appt with new psychiatrist today in [place]. Describes daughter as selfish 62 yr old daughter.Needed session today to work on a lot of frustration he is feeling in trying to help daughter. Looked at anger and some early origins of it with wife at time of very stressed feeling and relationship. States part of my anger may be related to stopping his  medication Sertraline prescribed by PCP. Also felt it took away all my feelings.  To give possible medication more thoughts between sessions.  Worked also on his anger issues and shared with him an article I have regarding anger, healthy ways of expressing it, cognitive restructuring, problem solving, and strategies to work and better manage anger.  He was very interested in her work on this today and we will continue next session as there does seem to be a long history of anger for him and has not had many tools to manage it more effectively in healthy ways.  Has reported that a lot of his anger is related to his first marriage, and even though he is remarried, he has stepped up to support his first wife as she is in latter stages of cancer.  Continue to work on not overly focusing on the negatives, to include positive self-care and self talk in his daily life, healthy boundaries, letting go of guilt related to prior relationships and adult daughter with schizophrenia, paying attention to connections between his thoughts and behaviors, trying to manage when "things do not go as planned or expected", and having more patience with himself.  Interventions: Cognitive Behavioral Therapy, Solution-Oriented/Positive Psychology, and Ego-Supportive  Treatment goal plan: Patient not signing treatment goal plan on computer screen due to [place].  He collaborated with this therapist on the formulation of his treatment plan and is in agreement with treatment goals. Treatment goals:  Treatment goals remain on treatment plan as patient works with strategies to achieve his goals.  Progress is assessed each session and noted in these "subject"/"plan" section of treatment note. Long-term goal: Reduce overall level, frequency, and intensity of the anxiety so that daily functioning is not impaired. Short-term goal: Increase understanding of beliefs and messages that produce the worry and anxiety.  Work to change those beliefs  and messages to healthier beliefs that can encourage patient and do not produce worry and anxiety. Strategies: Identify and used specific coping strategies for anxiety reduction  Diagnosis:   ICD-10-CM   1. Generalized anxiety disorder  F41.1      Plan:  Patient in today showing good participation and motivation in session working on as noted above his anxiety, anger, frustrations, and some impulsiveness.  Also concentrated some on issues with the relationship of his adult daughter with schizophrenia, and her mother which is his first wife and who is now in latter stages of cancer.  A lot of good processing about each of these relationships in session today.  Shared an article with him about anger that was very thorough but also very easy to understand and he is taking that home as an assignment and reading it and highlighting the parts that he relates to the most so he can bring it back to sessions and we use it in our work together here.  He did seem to find it very interesting just glancing at it today.  Continues to say he wants to make some changes but is just so easy to get frustrated quickly and "not manage my thoughts and feelings very well at times.  Shared tendency to hold in his feelings on the inside and then gets quite angry later and will sometimes let it out to forcefully and unexpectedly.  I shared with him that I thought he would find the article I gave him very helpful, very informative tool to use. Encouraged patient in practicing positive behaviors as noted in session including: Remaining in the present versus the past were too far into the future, practicing self calming behaviors, positive/encouraging self talk, work to better manage anxiety/anger/frustration in healthier ways, and recognize the strength he shows when working with goal-directed behaviors to move in a direction that supports his improved emotional health and overall outlook.  Goal review and progress/challenges noted  with patient.  Next appointment within 2 to 3 weeks.  This record has been created using [employer].  Chart creation errors have been sought, but may not always have been located and corrected.  Such creation errors do not reflect on the standard of medical care provided.   Shanon Ace, LCSW

## 2022-05-02 ENCOUNTER — Ambulatory Visit (INDEPENDENT_AMBULATORY_CARE_PROVIDER_SITE_OTHER): Payer: 59 | Admitting: Psychiatry

## 2022-05-02 DIAGNOSIS — F411 Generalized anxiety disorder: Secondary | ICD-10-CM | POA: Diagnosis not present

## 2022-05-02 NOTE — Progress Notes (Signed)
[facility] Counselor/Therapist Progress Note  Patient ID: Keith Mckee, MRN: [id number],    Date: 05/02/2022  Time Spent: 55 minutes   Treatment Type: Individual Therapy  Reported Symptoms: anxiety  Mental Status Exam:  Appearance:   Casual     Behavior:  Appropriate, Sharing, and Motivated  Motor:  Normal  Speech/Language:   Clear and Coherent  Affect:  anxious  Mood:  anxious  Thought process:  goal directed  Thought content:    WNL  Sensory/Perceptual disturbances:    WNL  Orientation:  oriented to person, place, time/date, situation, day of week, month of year, year, and stated date of Nov. 9, 2023  Attention:  Good  Concentration:  Good  Memory:  WNL  Fund of knowledge:   Good  Insight:    Good and Fair  Judgment:   Good  Impulse Control:  Good and Fair   Risk Assessment: Danger to Self:  No Self-injurious Behavior: No Danger to Others: No Duty to Warn:no Physical Aggression / Violence:No  Access to Firearms a concern: No  Gang Involvement:No   Subjective:   Patient in today reporting anxiety that is currently mild but fluctuates based on situations.  Anxiety currently related to adult daughter who is schizophrenia and living with patient's former wife who has cancer. "Anxiety over daughter  [name]" and trying to get appropriate placement for daughter and not getting accepted." Very concerned, anxious, frustrated over this situation and needed session to vent and process his thoughts/feelings as well as concerns for the future for his adult daughter. Frustrated, sad, anxious and talking through those feelings today and feels that he has managed anger some better recently and wants to pick up next session to work on anger as he has had significant issues with over the years.  A lot of his anger was reportedly a result of some issues with his first wife who is now and latter stages of cancer, and current wife is actually supportive of patient as he helps first  wife with whom their adult schizophrenic daughter is living.  Continued focus is to include not overly dwelling on the negatives, positive self-care and self talk daily, healthy boundaries, letting go of guilt related to prior relationships and adult daughter with schizophrenia, seeing the connections between his thoughts and behaviors, better managing when things do not go as planned or expected, and having patience with himself and others.  Interventions: Cognitive Behavioral Therapy and Ego-Supportive  Long-term goal: Reduce overall level, frequency, and intensity of the anxiety so that daily functioning is not impaired. Short-term goal: Increase understanding of beliefs and messages that produce the worry and anxiety.  Work to change those beliefs and messages to healthier beliefs that can encourage patient and do not produce worry and anxiety. Strategies: Identify and used specific coping strategies for anxiety reduction  Diagnosis:   ICD-10-CM   1. Generalized anxiety disorder  F41.1      Plan:  Patient today showing good participation in session working on anxiety most related to his adult daughter as noted above, and his former wife who is in latter stages of cancer and he is currently helping out with housing expenses.  Current wife is supportive of this.  Anxiety recently has mostly been around daughter situation and the fact she got turned down for long-term housing through [facility].  Continues working on other options to help get daughter in more permanent housing and in the meantime pays for her to be in  an apartment with his former wife.  Showing progress in more directly confronting issues today, not dwelling as much on the negatives, able to see positives, demonstrating clear boundaries, and feeling good about some of the things he is doing to help daughter, former wife living with cancer, as well as nurturing his current marriage.  Anger not as strong, and impulsivity seems to be  less also.  Recognizing more when he does not manage his thoughts or feelings very well and trying to change that to include healthier ways of managing them.  Processed the article I shared with him last time regarding anger and he made some notes and we plan to follow-up on this more next session. Encouraged patient in his practice of more positive behaviors as noted in session including: Staying in the present versus the past or too far into the future, practicing self calming behaviors, positive/encouraging self talk, work to better manage his anxiety/anger/frustration in healthier ways as discussed with strategies in session, and recognize the strength he shows working with goal-directed behaviors to move in a direction that supports his improved emotional health.  Goal review and progress/challenges noted with patient.  Next appointment within 2 to 3 weeks.  This record has been created using [employer].  Chart creation errors have been sought, but may not always have been located and corrected.  Such creation errors do not reflect on the standard of medical care provided.   Shanon Ace, LCSW

## 2022-05-09 ENCOUNTER — Ambulatory Visit: Payer: 59 | Admitting: Family Medicine

## 2022-05-15 ENCOUNTER — Encounter: Payer: Self-pay | Admitting: Family Medicine

## 2022-05-21 ENCOUNTER — Ambulatory Visit (INDEPENDENT_AMBULATORY_CARE_PROVIDER_SITE_OTHER): Payer: 59 | Admitting: Psychiatry

## 2022-05-21 DIAGNOSIS — F411 Generalized anxiety disorder: Secondary | ICD-10-CM

## 2022-05-21 NOTE — Progress Notes (Signed)
[facility] Counselor/Therapist Progress Note  Patient ID: Keith Mckee, MRN: [id number],    Date: 05/21/2022  Time Spent: 55 minutes   Treatment Type: Individual Therapy  Reported Symptoms: anxiety, depression "some increase"  Mental Status Exam:  Appearance:   Casual     Behavior:  Appropriate and Sharing  Motor:  Normal  Speech/Language:   Clear and Coherent  Affect:  Depressed and anxious  Mood:  anxious and depressed  Thought process:  goal directed  Thought content:    WNL  Sensory/Perceptual disturbances:    WNL  Orientation:  oriented to person, place, time/date, situation, day of week, month of year, year, and stated date of Nov. 28, 2023  Attention:  Fair  Concentration:  Fair  Memory:  [place] of knowledge:   Good  Insight:    Fair  Judgment:   Good  Impulse Control:  Good   Risk Assessment: Danger to Self:  No Self-injurious Behavior: No Danger to Others: No Duty to Warn:no Physical Aggression / Violence:No  Access to Firearms a concern: No  Gang Involvement:No   Subjective:  Patient in today reporting anxiety and some increase in depression. Problems communicating with ex-wife re: their adult schizophrenic daughter and recent confrontation about that. Ex-wife is also not doing well with her cancer/prognosis. Unpacking lots of stressors, thoughts, feelings, and concerns re: his daughter, his ex-wife with advanced cancer. Processed his feelings more directly today, realizing how hurtful the situation can be but doesn't feel he can control much of anything beyond himself.  Finding that his mood does fluctuate often based on circumstances within the family.  Continues to be frustrated, sad, and anxious about adult schizophrenic daughter but is also heavily involved in trying to find appropriate placement for her through agencies that have sites in [place] and [place].  Processed more of his depression today as it has worsened. Denies any SI  or HI.  Patient not interested in being evaluated for medications at this time but will keep it in mind.  Reports he has tried some in the past and were not helpful   Interventions: Cognitive Behavioral Therapy and Ego-Supportive  Long-term goal: Reduce overall level, frequency, and intensity of the anxiety so that daily functioning is not impaired. Short-term goal: Increase understanding of beliefs and messages that produce the worry and anxiety.  Work to change those beliefs and messages to healthier beliefs that can encourage patient and do not produce worry and anxiety. Strategies: Identify and used specific coping strategies for anxiety reduction    Diagnosis:   ICD-10-CM   1. Generalized anxiety disorder  F41.1      Plan:  Patient today participated well in session and focusing on his continued anxiety and issues related to his adult daughter who is diagnosed with schizophrenia, his current wife in their relationship, and his divorced wife who is gradually "dying with cancer".  Did well in working on each of these in session today and seem to find the discussion helpful and supportive.  (Not all details included in this note due to patient privacy needs).  Patient committed to his therapy and therapy goals as he tries to manage his current situation and responsibilities as effectively as possible and also take care of his own self and letting wife and friends be supportive. Encouraged patient in his practice of more positive behaviors as noted in session including: Staying in the present versus the past or too far into the future, practice  self calming behaviors, positive/encouraging self talk, work to better manage his anxiety/anger/frustration in healthier ways as discussed in session, and recognize the strength he shows working with goal-directed behaviors to move in a direction that supports his improved emotional health and overall wellbeing.  Goal review and progress/challenges noted  with patient.  Next appointment within 2 to 3 weeks.  This record has been created using [employer].  Chart creation errors have been sought, but may not always have been located and corrected.  Such creation errors do not reflect on the standard of medical care provided.   Shanon Ace, LCSW

## 2022-06-04 ENCOUNTER — Ambulatory Visit (INDEPENDENT_AMBULATORY_CARE_PROVIDER_SITE_OTHER): Payer: 59 | Admitting: Psychiatry

## 2022-06-04 DIAGNOSIS — F411 Generalized anxiety disorder: Secondary | ICD-10-CM | POA: Diagnosis not present

## 2022-06-04 NOTE — Progress Notes (Signed)
[facility] Counselor/Therapist Progress Note  Patient ID: Keith Mckee, MRN: [id number],    Date: 06/04/2022  Time Spent: 55 minutes   Treatment Type: Individual Therapy  Reported Symptoms: anxiety, depression, "not caring as much" but denies any SI.  Mental Status Exam:  Appearance:   Casual     Behavior:  Appropriate, Sharing, and Motivated  Motor:  Normal  Speech/Language:   Clear and Coherent  Affect:  Depressed and anxious  Mood:  anxious and depressed  Thought process:  goal directed  Thought content:    WNL  Sensory/Perceptual disturbances:    WNL  Orientation:  oriented to person, place, time/date, situation, day of week, month of year, year, and stated date of Dec. 12, 2023  Attention:  Good  Concentration:  Good  Memory:  WNL  Fund of knowledge:   Good  Insight:    Good and Fair  Judgment:   Good  Impulse Control:  Good and Fair   Risk Assessment: Danger to Self:  No Self-injurious Behavior: No Danger to Others: No Duty to Warn:no Physical Aggression / Violence:No  Access to Firearms a concern: No  Gang Involvement:No   Subjective: Patient today continues working on his anxiety and depression here at pre-holiday time, and adds some of his feeling more anxious, stressed and some depression could be related some to holidays. He and wife plan to go to [place] for a few days leaving on Christmas Day.  "Would like to be a happier person, and was before adult daughter moved from [place] to [place], and feels that his mentially ill adult daughter is the reason for a lot of his unhappiness and anger and has also "impacted the family as a whole." "Struggles because so much about her is out of his control yet she disrupts their lives." Processing regrets over the years, along with some guilt.  Patient is experiencing a lot of challenges and remains committed to himself and treatment goal behaviors. (More details included and previous treatment notes.  Not all details  included in this note today due to patient privacy needs.)  Interventions: Cognitive Behavioral Therapy, Solution-Oriented/Positive Psychology, and Ego-Supportive  Long-term goal: Reduce overall level, frequency, and intensity of the anxiety so that daily functioning is not impaired. Short-term goal: Increase understanding of beliefs and messages that produce the worry and anxiety.  Work to change those beliefs and messages to healthier beliefs that can encourage patient and do not produce worry and anxiety. Strategies: Identify and used specific coping strategies for anxiety reduction  Diagnosis:   ICD-10-CM   1. Generalized anxiety disorder  F41.1      Plan: Patient today participating well in session as he continued working on depression and anxiety mostly regarding his personal and family situation including his adult daughter who is schizophrenic and has difficulty following any rules or suggestions, his ex-wife who is dying of cancer but patient still provide some support to her, and his current marriage which seems to be pretty good although affected at times by patient's current stressors involving adult daughter and ex-wife.  (Not all details included in this note due to patient privacy needs).  Patient questions himself at times but seems to be trying to make good decisions and certainly some very fair decisions regarding his support of adult daughter and also ex-wife who is in the latter stages of cancer.  Patient states that he and his current wife remain very committed to each other and they are planning to take  a very short trip to [place] during the holiday time for 3 days.  His commitment and therapy continues as he tries to manage some tedious situations and responsibilities while also taking care of himself physically and emotionally. Encourage patient and practicing more positive behaviors as noted in session including: Remain in the present versus the past or too far into the  future, practice self calming behaviors, positive/encouraging self talk, work to better manage his anxiety/anger/frustration in healthier ways as discussed in sessions, and recognize the strength he shows working with goal-directed behaviors to move in a direction that supports his improved emotional health.  Goal review and progress/challenges noted with patient.  Next appointment within 3 weeks.  This record has been created using [employer].  Chart creation errors have been sought, but may not always have been located and corrected.  Such creation errors do not reflect on the standard of medical care provided.   Shanon Ace, LCSW

## 2022-06-20 ENCOUNTER — Ambulatory Visit (INDEPENDENT_AMBULATORY_CARE_PROVIDER_SITE_OTHER): Payer: 59 | Admitting: Psychiatry

## 2022-07-04 ENCOUNTER — Ambulatory Visit (INDEPENDENT_AMBULATORY_CARE_PROVIDER_SITE_OTHER): Payer: 59 | Admitting: Psychiatry

## 2022-07-04 DIAGNOSIS — F411 Generalized anxiety disorder: Secondary | ICD-10-CM | POA: Diagnosis not present

## 2022-07-04 NOTE — Progress Notes (Signed)
[facility] Counselor/Therapist Progress Note  Patient ID: Keith Mckee, MRN: [id number],    Date: 07/04/2022  Time Spent: 50 minutes   Treatment Type: Individual Therapy  Reported Symptoms: anger, impulsivity and working on increased awareness and self-control/grounding, anxiety, denies depression  Mental Status Exam:  Appearance:   Casual     Behavior:  Appropriate, Sharing, and Motivated  Motor:  Normal  Speech/Language:   Clear and Coherent  Affect:  Flat, some anxiety at times  Mood:  anxious  Thought process:  normal  Thought content:    WNL  Sensory/Perceptual disturbances:    WNL  Orientation:  oriented to person, place, time/date, situation, day of week, month of year, year, and stated date of Jan. 11, 2024  Attention:  Good  Concentration:  Good  Memory:  WNL  Fund of knowledge:   Good  Insight:    Good  Judgment:   Good  Impulse Control:  Good and Fair   Risk Assessment: Danger to Self:  No Self-injurious Behavior: No Danger to Others: No Duty to Warn:no Physical Aggression / Violence:No  Access to Firearms a concern: No  Gang Involvement:No   Subjective:  Patient in today reporting anxiety, some impulsivity when frustrated.  Continues working on his anger/impulsivity and his anxiety. Less depressed overall.  Worked with some specific examples of trying to improve his concentration, self-control, self-awareness and awareness of others and his surroundings, and ability to manage stressful situations more effectively rather than impulsively.  Worked with specific examples in session today.  Patient does seem motivated but this is all very difficult for him as he has tended to be more impulsive and easily angered.  Reports trip over the holidays was not good because the place they went was overcrowded.  Wants to be happier and knows that he needs to learn to handle both positives and negatives, and stressors more effectively.  Adult daughter with  schizophrenia is also a significant stressor for him and he is following through with resources for help in her situation.  Does recognize that "control" is a big issue for him and he does not like not being in control of things.  Worked on this some today and plan to follow through next session.  Interventions: Cognitive Behavioral Therapy, Solution-Oriented/Positive Psychology, and Ego-Supportive  Long-term goal: Reduce overall level, frequency, and intensity of the anxiety so that daily functioning is not impaired.  Understand and accept that he cannot be in control of everything. Short-term goal: Increase understanding of beliefs and messages that produce the worry and anxiety.  Work to change those beliefs and messages to healthier beliefs that can encourage patient and do not produce worry and anxiety. Strategies: Identify and used specific coping strategies for anxiety reduction, and less efforts to be controlling and rather except the fact that none of [place] can control everything.  Diagnosis:   ICD-10-CM   1. Generalized anxiety disorder  F41.1      Plan: Patient in today and did some good work on his anxiety and impulsivity.  States he wants to make changes yet shows some resistance as he states, "I have always handled things this way" and I think it is an honest acknowledgment that it will be tough for him to change, even though he states he wants to change.  Worked with the whole issue about "a change being needed" which seem to be helpful with patient today.  Homework assigned regarding his anxiety and frustrations and control issues and  we will see again within 2 weeks.  Encouraged patient in his practice of more positive behaviors as discussed in session including: Staying in the present versus the past or too far into the future, positive/encouraging self talk, practicing self calming behaviors and being able to walk away from stressful situations, work to better manage his  anxiety/anger/frustration in healthier ways as discussed in session, and realize the strength he shows working with goal-directed behaviors to move in a direction that supports his improved emotional health and overall wellbeing.  Goal review and progress/challenges noted with patient.  Next appointment within 3 weeks.  This record has been created using [employer].  Chart creation errors have been sought, but may not always have been located and corrected.  Such creation errors do not reflect on the standard of medical care provided.   Shanon Ace, LCSW

## 2022-07-13 ENCOUNTER — Other Ambulatory Visit: Payer: Self-pay | Admitting: Family Medicine

## 2022-07-15 MED ORDER — HYDROCODONE-ACETAMINOPHEN 7.5-325 MG PO TABS: 1.0000 | ORAL_TABLET | Freq: Four times a day (QID) | ORAL | 0 refills | Status: DC | PRN

## 2022-07-15 NOTE — Telephone Encounter (Signed)
Requesting: [facility]  Contract: 04/04/21 UDS: 10/02/21 Last Visit: 03/15/22 Next Visit: follow up not completed Last Refill: 03/07/22 (30,0)  Please Advise. Med pending

## 2022-07-16 NOTE — Telephone Encounter (Signed)
Pt advised refill sent. °

## 2022-07-18 ENCOUNTER — Ambulatory Visit (INDEPENDENT_AMBULATORY_CARE_PROVIDER_SITE_OTHER): Payer: 59 | Admitting: Psychiatry

## 2022-07-18 DIAGNOSIS — F411 Generalized anxiety disorder: Secondary | ICD-10-CM | POA: Diagnosis not present

## 2022-07-18 NOTE — Progress Notes (Signed)
[facility] Counselor/Therapist Progress Note  Patient ID: Keith Mckee, MRN: [id number],    Date: 07/18/2022  Time Spent: 50 minutes  Treatment Type: Individual Therapy  Reported Symptoms: anxiety, depression (both improved)  Mental Status Exam:  Appearance:   Casual     Behavior:  Appropriate, Sharing, and not as motivated  Motor:  Normal  Speech/Language:   Clear and Coherent  Affect:  Depressed and anxiety  Mood:  anxious, some depression  Thought process:  goal directed  Thought content:    WNL  Sensory/Perceptual disturbances:    WNL  Orientation:  oriented to person, place, time/date, situation, day of week, month of year, year, and stated date of Jan. 25, 2024  Attention:  Good  Concentration:  Good  Memory:  WNL  Fund of knowledge:   Good  Insight:    Good and Fair  Judgment:   Good  Impulse Control:  Fair   Risk Assessment: Danger to Self:  No Self-injurious Behavior: No Danger to Others: No Duty to Warn:no Physical Aggression / Violence:No  Access to Firearms a concern: No  Gang Involvement:No   Subjective: Patient in today and reporting anxiety and depression have both improved some, and impulsivity patient is continuing to work and involved with some hobbies (woodworking).  Daughter living with schizophrenia, has appt today re: possible housing through [place].  Shared frustrations with situation as well as some of his impulsiveness when dealing with anger or unexpected situations.  Worked consistently in session today on this impulsiveness and his anger, especially his tendencies to strike back when in traffic or other situations.  Worked on some mindfulness and how that can help anchor him in some of the situations before they worsen, going into certain situations that he knows are more likely to "set him off" and having more awareness of some better options for managing anger especially.  Use some recent examples of anger that occurred for patient,  looking at how he handled it, versus how he could try handling it in the future using increased awareness, looking at what helps and what does not, and healthier ways of managing that anger, as well as being able to let go and resist the urge "to get someone back" who has angered him.  Focus some very intentionally on how this can happen easily when he is driving in traffic, as he has previously felt unable to back off and would make things worse by having a reaction to whoever angered him.  Discussed self-control, self-awareness, concentration, and awareness of surroundings.  Has some motivation however doubts himself to be able to make changes, and discussed some of those doubts today.  Interventions: Cognitive Behavioral Therapy and Ego-Supportive  Long-term goal: Reduce overall level, frequency, and intensity of the anxiety so that daily functioning is not impaired.  Understand and accept that he cannot be in control of everything. Short-term goal: Increase understanding of beliefs and messages that produce the worry and anxiety.  Work to change those beliefs and messages to healthier beliefs that can encourage patient and do not produce worry and anxiety. Strategies: Identify and used specific coping strategies for anxiety reduction, and less efforts to be controlling and rather except the fact that none of [place] can control everything.  Diagnosis:   ICD-10-CM   1. Generalized anxiety disorder  F41.1      Plan: Patient showing some progress and that he is continuing to face some of the changes that are needed in his life  particularly working with anxiety, anger and managing it better, and decreasing impulsivity while making better choices and managing his anxiety and anger.  Needs to continue working with goal-directed behaviors revolving around his anxiety, worry, and anger management in order to move forward in a more positive direction and feel that he can better manage his emotions without  being destructive in any way.  Patient has stated "I have always handled things this way" and admits it is hard for him to imagine changing but seems to genuinely want to do so. Encouraged patient in practicing more positive behaviors as discussed in session including: Remaining in the present versus the past or too far into the future, work to better manage his anxiety/anger/frustration in healthier ways as discussed in session, practice self calming behaviors and being able to walk away from stressful situations, positive/encouraging self talk, and recognize the strength he shows working with goal-directed behaviors to move in a direction that supports his improved emotional health and overall wellbeing.  Goal review and progress/challenges noted with patient.  Next appt within 3 weeks.  This record has been created using [employer].  Chart creation errors have been sought, but may not always have been located and corrected.  Such creation errors do not reflect on the standard of medical care provided.   Shanon Ace, LCSW

## 2022-08-08 ENCOUNTER — Ambulatory Visit (INDEPENDENT_AMBULATORY_CARE_PROVIDER_SITE_OTHER): Payer: 59 | Admitting: Psychiatry

## 2022-08-15 ENCOUNTER — Ambulatory Visit: Payer: 59 | Admitting: Psychiatry

## 2022-09-26 DIAGNOSIS — R1310 Dysphagia, unspecified: Secondary | ICD-10-CM | POA: Insufficient documentation

## 2022-10-03 ENCOUNTER — Encounter: Payer: Self-pay | Admitting: Family Medicine

## 2022-10-03 ENCOUNTER — Ambulatory Visit (INDEPENDENT_AMBULATORY_CARE_PROVIDER_SITE_OTHER): Payer: 59 | Admitting: Family Medicine

## 2022-10-03 VITALS — BP 117/74 | HR 65 | Wt 171.8 lb

## 2022-10-03 DIAGNOSIS — R454 Irritability and anger: Secondary | ICD-10-CM

## 2022-10-03 DIAGNOSIS — F411 Generalized anxiety disorder: Secondary | ICD-10-CM

## 2022-10-03 MED ORDER — BUSPIRONE HCL 15 MG PO TABS: ORAL_TABLET | ORAL | 1 refills | Status: DC

## 2022-10-03 NOTE — Progress Notes (Signed)
OFFICE VISIT  10/03/2022  CC:  Chief Complaint  Patient presents with   Medication Refill    Wants to restart Buspar due to increased anxiety.    Patient is a 62 y.o. male who presents for follow-up anxiety.  INTERIM HX: Lots of anxiety lately, primarily centered around some life situations with his daughter. He saw counselor for a while but feels like they were not great fit and he did not get any good suggestions on coping skills for his anxiety and anger. He requests another trial of BuSpar.  He recalls this being a medication that his father, who had similar problems to [place], found significant improvement on.   PMP AWARE reviewed today: most recent rx for Vicodin 7.5/325 was filled 07/15/2022, # 30, rx by me.  Most recent clonazepam prescription filled 08/29/2021, #120, prescription by me.  No red flags.  Past Medical History:  Diagnosis Date   Anxiety    BPH (benign prostatic hypertrophy)    Cataract    artifiicial lenses in both eyes   Cervical spondylosis    with radiculopathy not responsive to conservative therapies:  [facility]: ACDF surgery C5-C7 performed 04/05/15   Chronic pain syndrome    cervicalgia, heredetary PN, LBP   COVID-19 virus infection 11/2020   Difficulty controlling anger    + GAD with OCD tendencies + memory changes---consider neuropsych testing   DISH (diffuse idiopathic skeletal hyperostosis)    Dysphagia    Dysplastic nevus 10/2015   left mid back: Dr. Jorja Loa following   GERD (gastroesophageal reflux disease)    +LPR (ENT 09/2022)   Glaucoma    History of adenomatous polyp of colon    IBS (irritable bowel syndrome)    +constipation   Microscopic colitis 06/2019   colonoscopy + 06/2019   Neuropathic pain    Peripheral neuropathy    Idiopathic, inherited per pt's neurologist, Dr. Antonietta Barcelona. Various labs normal 07/2016 except vitamin B6 was siginificantly elevated.  Allergist eval/allergy testing (including food) ALL NORMAL/NEG  07/2016.   Restless legs syndrome 09/27/2014   Throat irritation    chronic (since cervical spine surgery 2016).  Dr. Lazarus Salines 2017->laryngoscopy normal, CT sinuses normal, barium swallow normal, trial of protonix no help.     Past Surgical History:  Procedure Laterality Date   Ant cerv decompression/discectomy w/fusion C 5-7  04/05/15   C5-7 ACDF   CATARACT EXTRACTION Bilateral    COLONOSCOPY  06/29/2019   FOR EVAL OF UNEXPLAINED DIARRHEA-->normal, bx c/w microscopic colitis->uceris and budesonide rx'd by Dr. Lavon Paganini 06/2019   COLONOSCOPY W/ POLYPECTOMY  08/2012; 05/23/16   2017, adenomatous polyp--recall 5 yrs.   EYE SURGERY Right 09/06/2020   TRABECULECTOMY Bilateral    for uncontrolled glaucoma    TRANSURETHRAL RESECTION OF PROSTATE  2008   due to enlarged prostate   VASECTOMY  2001    Outpatient Medications Prior to Visit  Medication Sig Dispense Refill   Alpha-Lipoic Acid 300 MG CAPS Take 1 capsule by mouth 2 (two) times daily.     clonazePAM (KLONOPIN) 0.5 MG tablet 1-2 tabs po bid 120 tablet 2   dorzolamide-timolol (COSOPT) 22.3-6.8 MG/ML ophthalmic solution Place 1 drop into the right eye 2 (two) times daily.     gabapentin (NEURONTIN) 600 MG tablet TAKE ONE AND ONE-HALF TABLETS IN THE MORNING AND ONE AND ONE-HALF TABLETS IN THE EVENING     HYDROcodone-acetaminophen (NORCO) 7.5-325 MG tablet Take 1 tablet by mouth every 6 (six) hours as needed for moderate pain.  30 tablet 0   Magnesium 300 MG CAPS Take by mouth as needed.      Omega-3 Fatty Acids (OMEGA-3 FISH OIL) 300 MG CAPS Take 1 capsule by mouth daily.     polyethylene glycol powder (GLYCOLAX/MIRALAX) 17 GM/SCOOP powder Take by mouth.     sildenafil (REVATIO) 20 MG tablet 1-5 tabs po qd prn intercourse 30 tablet 11   timolol (TIMOPTIC) 0.5 % ophthalmic solution SMARTSIG:In Eye(s)     HYDROcodone-acetaminophen (NORCO/VICODIN) 5-325 MG tablet Take 1-2 tablets by mouth every 6 (six) hours as needed for moderate pain. 30  tablet 0   sertraline (ZOLOFT) 100 MG tablet TAKE 1.5 TABLETS BY MOUTH EVERY DAY (Patient not taking: Reported on 03/15/2022) 135 tablet 4   No facility-administered medications prior to visit.    No Known Allergies  Review of Systems As per HPI  PE:    10/03/2022    3:15 PM 03/15/2022    1:30 PM 02/21/2022    3:06 PM  Vitals with BMI  Height  6\' 1"  6\' 1"   Weight 171 lbs 13 oz 168 lbs 168 lbs 3 oz  BMI  22.17 22.2  Systolic 117 92 106  Diastolic 74 54 71  Pulse 65 60 66     Physical Exam  Gen: Alert, well appearing.  Patient is oriented to person, place, time, and situation. AFFECT: pleasant, lucid thought and speech. No further exam today  LABS:  none  IMPRESSION AND PLAN:  Generalized anxiety, hypertension and history of difficulty controlling anger. I agree with a trial of BuSpar starting at 50 mg a day.  Build up every 3 days to 50 mg 3 times daily. Therapeutic expectations and side effect profile of medication discussed today.  Patient's questions answered.   An After Visit Summary was printed and given to the patient.  FOLLOW UP: Return in about 6 weeks (around 11/14/2022) for f/u anx.  Signed:  Santiago Bumpers, MD           10/03/2022

## 2022-10-26 ENCOUNTER — Other Ambulatory Visit: Payer: Self-pay | Admitting: Family Medicine

## 2022-10-31 ENCOUNTER — Encounter: Payer: Self-pay | Admitting: Family Medicine

## 2022-11-14 ENCOUNTER — Ambulatory Visit: Payer: 59 | Admitting: Medical

## 2022-11-19 ENCOUNTER — Ambulatory Visit (INDEPENDENT_AMBULATORY_CARE_PROVIDER_SITE_OTHER): Payer: 59 | Admitting: Family Medicine

## 2022-11-19 ENCOUNTER — Encounter: Payer: Self-pay | Admitting: Family Medicine

## 2022-11-19 VITALS — BP 118/75 | HR 62 | Wt 171.4 lb

## 2022-11-19 DIAGNOSIS — R454 Irritability and anger: Secondary | ICD-10-CM

## 2022-11-19 DIAGNOSIS — F411 Generalized anxiety disorder: Secondary | ICD-10-CM

## 2022-11-19 MED ORDER — BUSPIRONE HCL 15 MG PO TABS: ORAL_TABLET | ORAL | 1 refills | Status: DC

## 2022-11-19 NOTE — Progress Notes (Signed)
OFFICE VISIT  11/19/2022  CC:  Chief Complaint  Patient presents with   Follow-up    Anxiety.     Patient is a 62 y.o. male who presents for 6-week follow-up anxiety. A/P as of last visit: "Generalized anxiety, hypertension and history of difficulty controlling anger. I agree with a trial of BuSpar starting at 15 mg a day.  Build up every 3 days to 15 mg 3 times daily. Therapeutic expectations and side effect profile of medication discussed today.  Patient's questions answered.  INTERIM HX: Keith Mckee reports a small amount of improvement in his anger level.  Feels a bit more calm. Still has some anxiety bothering him.  Still says gets impulsively frustrated a lot still. He wants to go up on the dose of the BuSpar if possible. He tends to forget his midday dose. Denies side effect.  Of note, he has had a case of sinusitis and bronchitis lately.  He went to urgent care a week ago and got prednisone and an antibiotic and has improved significantly.  PMP AWARE reviewed today: most recent rx for gabapentin was filled 10/23/2022, # 360, rx by Lirim Tonuzi. Most recent prescription for Vicodin was filled 07/15/2022, #30, prescription by me. Most recent prescription for clonazepam was filled 08/29/2021, #120, prescription by me. No red flags.  Past Medical History:  Diagnosis Date   Anxiety    BPH (benign prostatic hypertrophy)    Cataract    artifiicial lenses in both eyes   Cervical spondylosis    with radiculopathy not responsive to conservative therapies:  [facility] 02/2015: ACDF surgery C5-C7 performed 04/05/15   Chronic pain syndrome    cervicalgia, heredetary PN, LBP   COVID-19 virus infection 11/2020   Difficulty controlling anger    + GAD with OCD tendencies + memory changes---consider neuropsych testing   DISH (diffuse idiopathic skeletal hyperostosis)    Dysphagia    Dysplastic nevus 10/2015   left mid back: Dr. Jorja Loa following   GERD (gastroesophageal reflux  disease)    +LPR (ENT 09/2022)   Glaucoma    History of adenomatous polyp of colon    IBS (irritable bowel syndrome)    +constipation   Microscopic colitis 06/2019   colonoscopy + 06/2019   Neuropathic pain    Peripheral neuropathy    Idiopathic, inherited per pt's neurologist, Dr. Antonietta Barcelona. Various labs normal 07/2016 except vitamin B6 was siginificantly elevated.  Allergist eval/allergy testing (including food) ALL NORMAL/NEG 07/2016.   Restless legs syndrome 09/27/2014   Throat irritation    chronic (since cervical spine surgery 2016).  Dr. Lazarus Salines 2017->laryngoscopy normal, CT sinuses normal, barium swallow normal, trial of protonix no help.     Past Surgical History:  Procedure Laterality Date   Ant cerv decompression/discectomy w/fusion C 5-7  04/05/15   C5-7 ACDF   CATARACT EXTRACTION Bilateral    COLONOSCOPY  06/29/2019   FOR EVAL OF UNEXPLAINED DIARRHEA-->normal, bx c/w microscopic colitis->uceris and budesonide rx'd by Dr. Lavon Paganini 06/2019   COLONOSCOPY W/ POLYPECTOMY  08/2012; 05/23/16   2017, adenomatous polyp--recall 5 yrs.   EYE SURGERY Right 09/06/2020   TRABECULECTOMY Bilateral    for uncontrolled glaucoma    TRANSURETHRAL RESECTION OF PROSTATE  2008   due to enlarged prostate   VASECTOMY  2001    Outpatient Medications Prior to Visit  Medication Sig Dispense Refill   Alpha-Lipoic Acid 300 MG CAPS Take 1 capsule by mouth 2 (two) times daily.     busPIRone (BUSPAR) 15  MG tablet 1 tab po tid 90 tablet 1   clonazePAM (KLONOPIN) 0.5 MG tablet 1-2 tabs po bid 120 tablet 2   dorzolamide-timolol (COSOPT) 22.3-6.8 MG/ML ophthalmic solution Place 1 drop into the right eye 2 (two) times daily.     gabapentin (NEURONTIN) 600 MG tablet TAKE ONE AND ONE-HALF TABLETS IN THE MORNING AND ONE AND ONE-HALF TABLETS IN THE EVENING     HYDROcodone-acetaminophen (NORCO) 7.5-325 MG tablet Take 1 tablet by mouth every 6 (six) hours as needed for moderate pain. 30 tablet 0   Magnesium 300 MG  CAPS Take by mouth as needed.      Omega-3 Fatty Acids (OMEGA-3 FISH OIL) 300 MG CAPS Take 1 capsule by mouth daily.     polyethylene glycol powder (GLYCOLAX/MIRALAX) 17 GM/SCOOP powder Take by mouth.     sildenafil (REVATIO) 20 MG tablet 1-5 tabs po qd prn intercourse 30 tablet 11   timolol (TIMOPTIC) 0.5 % ophthalmic solution SMARTSIG:In Eye(s)     No facility-administered medications prior to visit.    No Known Allergies  Review of Systems As per HPI  PE:    11/19/2022    9:54 AM 10/03/2022    3:15 PM 03/15/2022    1:30 PM  Vitals with BMI  Height   6\' 1"   Weight 171 lbs 6 oz 171 lbs 13 oz 168 lbs  BMI   22.17  Systolic 118 117 92  Diastolic 75 74 54  Pulse 62 65 60     Physical Exam  Gen: Alert, well appearing.  Patient is oriented to person, place, time, and situation. AFFECT: pleasant, lucid thought and speech. OZH:YQMV: no injection, icteris, swelling, or exudate.  EOMI, PERRLA. Mouth: lips without lesion/swelling.  Oral mucosa pink and moist. Oropharynx without erythema, exudate, or swelling.  No sinus TTP. CV: RRR, no m/r/g.   LUNGS: CTA bilat, nonlabored resps, good aeration in all lung fields.   LABS:  Last CBC Lab Results  Component Value Date   WBC 5.1 03/15/2022   HGB 15.9 03/15/2022   HCT 47.1 03/15/2022   MCV 90.3 03/15/2022   MCH 30.7 09/27/2020   RDW 13.9 03/15/2022   PLT 164.0 03/15/2022   Last metabolic panel Lab Results  Component Value Date   GLUCOSE 59 (L) 03/15/2022   NA 142 03/15/2022   K 4.1 03/15/2022   CL 103 03/15/2022   CO2 31 03/15/2022   BUN 17 03/15/2022   CREATININE 1.02 03/15/2022   CALCIUM 9.7 03/15/2022   PROT 6.9 03/15/2022   ALBUMIN 4.4 03/15/2022   BILITOT 0.7 03/15/2022   ALKPHOS 74 03/15/2022   AST 14 03/15/2022   ALT 15 03/15/2022   IMPRESSION AND PLAN:  #1 generalized anxiety disorder, difficulty controlling anger. He is a little bit improved on buspirone at 15 mg twice a day dosing. Will increase to  30 mg twice a day.  An After Visit Summary was printed and given to the patient.  FOLLOW UP: No follow-ups on file.  Signed:  Santiago Bumpers, MD           11/19/2022

## 2022-11-21 ENCOUNTER — Ambulatory Visit: Payer: 59 | Admitting: Family Medicine

## 2022-12-14 ENCOUNTER — Other Ambulatory Visit: Payer: Self-pay | Admitting: Family Medicine

## 2022-12-31 ENCOUNTER — Ambulatory Visit: Payer: 59 | Admitting: Family Medicine

## 2023-01-09 ENCOUNTER — Encounter: Payer: Self-pay | Admitting: Family Medicine

## 2023-01-09 ENCOUNTER — Ambulatory Visit (INDEPENDENT_AMBULATORY_CARE_PROVIDER_SITE_OTHER): Payer: 59 | Admitting: Family Medicine

## 2023-01-09 VITALS — BP 101/68 | HR 57 | Wt 167.8 lb

## 2023-01-09 DIAGNOSIS — Z79899 Other long term (current) drug therapy: Secondary | ICD-10-CM

## 2023-01-09 DIAGNOSIS — G894 Chronic pain syndrome: Secondary | ICD-10-CM | POA: Diagnosis not present

## 2023-01-09 MED ORDER — BUSPIRONE HCL 15 MG PO TABS: ORAL_TABLET | ORAL | 1 refills | Status: DC

## 2023-01-09 NOTE — Progress Notes (Signed)
OFFICE VISIT  01/09/2023  CC:  Chief Complaint  Patient presents with   Follow-up    6 week follow up on anger. No other questions or concerns.    Patient is a 62 y.o. male who presents for 6-week follow-up anxiety and difficulty controlling anger. A/P as of last visit: "1 generalized anxiety disorder, difficulty controlling anger. He is a little bit improved on buspirone at 15 mg twice a day dosing. Will increase to 30 mg twice a day."  INTERIM HX: He feels a little improved regarding impulsivity and anger control. No side effects from BuSpar. He has several circumstances/relationships in life that he cannot change and has no control over and these are some things that he is gradually trying to cope with.  He uses clonazepam only occasionally.  Past Medical History:  Diagnosis Date   Anxiety    BPH (benign prostatic hypertrophy)    Cataract    artifiicial lenses in both eyes   Cervical spondylosis    with radiculopathy not responsive to conservative therapies:  [facility] 02/2015: ACDF surgery C5-C7 performed 04/05/15   Chronic pain syndrome    cervicalgia, heredetary PN, LBP   COVID-19 virus infection 11/2020   Difficulty controlling anger    + GAD with OCD tendencies + memory changes---consider neuropsych testing   DISH (diffuse idiopathic skeletal hyperostosis)    Dysphagia    Dysplastic nevus 10/2015   left mid back: Dr. Jorja Loa following   GERD (gastroesophageal reflux disease)    +LPR (ENT 09/2022)   Glaucoma    History of adenomatous polyp of colon    IBS (irritable bowel syndrome)    +constipation   Microscopic colitis 06/2019   colonoscopy + 06/2019   Neuropathic pain    Peripheral neuropathy    Idiopathic, inherited per pt's neurologist, Dr. Antonietta Barcelona. Various labs normal 07/2016 except vitamin B6 was siginificantly elevated.  Allergist eval/allergy testing (including food) ALL NORMAL/NEG 07/2016.   Restless legs syndrome 09/27/2014   Throat  irritation    chronic (since cervical spine surgery 2016).  Dr. Lazarus Salines 2017->laryngoscopy normal, CT sinuses normal, barium swallow normal, trial of protonix no help.     Past Surgical History:  Procedure Laterality Date   Ant cerv decompression/discectomy w/fusion C 5-7  04/05/15   C5-7 ACDF   CATARACT EXTRACTION Bilateral    COLONOSCOPY  06/29/2019   FOR EVAL OF UNEXPLAINED DIARRHEA-->normal, bx c/w microscopic colitis->uceris and budesonide rx'd by Dr. Lavon Paganini 06/2019   COLONOSCOPY W/ POLYPECTOMY  08/2012; 05/23/16   2017, adenomatous polyp--recall 5 yrs.   EYE SURGERY Right 09/06/2020   TRABECULECTOMY Bilateral    for uncontrolled glaucoma    TRANSURETHRAL RESECTION OF PROSTATE  2008   due to enlarged prostate   VASECTOMY  2001    Outpatient Medications Prior to Visit  Medication Sig Dispense Refill   Alpha-Lipoic Acid 300 MG CAPS Take 1 capsule by mouth 2 (two) times daily.     clonazePAM (KLONOPIN) 0.5 MG tablet 1-2 tabs po bid 120 tablet 2   dorzolamide-timolol (COSOPT) 22.3-6.8 MG/ML ophthalmic solution Place 1 drop into the right eye 2 (two) times daily.     gabapentin (NEURONTIN) 600 MG tablet TAKE ONE AND ONE-HALF TABLETS IN THE MORNING AND ONE AND ONE-HALF TABLETS IN THE EVENING     HYDROcodone-acetaminophen (NORCO) 7.5-325 MG tablet Take 1 tablet by mouth every 6 (six) hours as needed for moderate pain. 30 tablet 0   Magnesium 300 MG CAPS Take by mouth as  needed.      Omega-3 Fatty Acids (OMEGA-3 FISH OIL) 300 MG CAPS Take 1 capsule by mouth daily.     polyethylene glycol powder (GLYCOLAX/MIRALAX) 17 GM/SCOOP powder Take by mouth.     sildenafil (REVATIO) 20 MG tablet 1-5 tabs po qd prn intercourse 30 tablet 11   timolol (TIMOPTIC) 0.5 % ophthalmic solution SMARTSIG:In Eye(s)     busPIRone (BUSPAR) 15 MG tablet 2 tabs po bid 120 tablet 1   No facility-administered medications prior to visit.    No Known Allergies  Review of Systems As per HPI  PE:     01/09/2023   11:20 AM 11/19/2022    9:54 AM 10/03/2022    3:15 PM  Vitals with BMI  Weight 167 lbs 13 oz 171 lbs 6 oz 171 lbs 13 oz  Systolic 101 118 782  Diastolic 68 75 74  Pulse 57 62 65     Physical Exam  Gen: Alert, well appearing.  Patient is oriented to person, place, time, and situation. AFFECT: pleasant, lucid thought and speech. No further exam today  LABS:  Last metabolic panel Lab Results  Component Value Date   GLUCOSE 59 (L) 03/15/2022   NA 142 03/15/2022   K 4.1 03/15/2022   CL 103 03/15/2022   CO2 31 03/15/2022   BUN 17 03/15/2022   CREATININE 1.02 03/15/2022   GFR 80.39 03/15/2022   CALCIUM 9.7 03/15/2022   PROT 6.9 03/15/2022   ALBUMIN 4.4 03/15/2022   BILITOT 0.7 03/15/2022   ALKPHOS 74 03/15/2022   AST 14 03/15/2022   ALT 15 03/15/2022   IMPRESSION AND PLAN:  Generalized anxiety, difficulty controlling anger. He has had some response to BuSpar.  He does not want to try anything different right now.   He has seen a counselor in the past but feels like he was not getting anything positive out of it so he stopped going.  He has considered going to a different counselor but has not acted on it yet. Continue BuSpar 15 mg tabs, 2 tabs twice daily.  Refill today x 90 days, 1 additional refill. He uses clonazepam only occasionally.  No refill was needed today but he will call for this in the future.  He does have a chronic pain syndrome for which he sometimes uses hydrocodone but not very frequently at all.  He does not need a new prescription at this time but he will call for this in the future.  An After Visit Summary was printed and given to the patient.  FOLLOW UP: Return in about 6 months (around 07/12/2023) for routine chronic illness f/u.  Signed:  Santiago Bumpers, MD           01/09/2023

## 2023-01-11 LAB — DM TEMPLATE

## 2023-01-11 LAB — DRUG MONITORING PANEL 376104, URINE
Amphetamines: NEGATIVE ng/mL (ref <500)
Barbiturates: NEGATIVE ng/mL (ref <300)
Benzodiazepines: NEGATIVE ng/mL (ref <100)
Cocaine Metabolite: NEGATIVE ng/mL (ref <150)
Desmethyltramadol: NEGATIVE ng/mL (ref <100)
Opiates: NEGATIVE ng/mL (ref <100)
Oxycodone: NEGATIVE ng/mL (ref <100)
Tramadol: NEGATIVE ng/mL (ref <100)

## 2023-03-03 ENCOUNTER — Other Ambulatory Visit (HOSPITAL_BASED_OUTPATIENT_CLINIC_OR_DEPARTMENT_OTHER): Payer: 59

## 2023-03-03 ENCOUNTER — Encounter (HOSPITAL_BASED_OUTPATIENT_CLINIC_OR_DEPARTMENT_OTHER): Payer: Self-pay | Admitting: Cardiology

## 2023-03-03 ENCOUNTER — Ambulatory Visit (INDEPENDENT_AMBULATORY_CARE_PROVIDER_SITE_OTHER): Payer: 59 | Admitting: Cardiology

## 2023-03-03 VITALS — BP 118/82 | HR 62 | Ht 73.0 in | Wt 167.8 lb

## 2023-03-03 DIAGNOSIS — Z7189 Other specified counseling: Secondary | ICD-10-CM | POA: Diagnosis not present

## 2023-03-03 DIAGNOSIS — R110 Nausea: Secondary | ICD-10-CM

## 2023-03-03 DIAGNOSIS — R002 Palpitations: Secondary | ICD-10-CM

## 2023-03-03 DIAGNOSIS — Z8249 Family history of ischemic heart disease and other diseases of the circulatory system: Secondary | ICD-10-CM

## 2023-03-03 NOTE — Progress Notes (Signed)
Cardiology Office Note:  .    Date:  03/03/2023  ID:  Keith Mckee, DOB 12-25-1960, MRN [id number] PCP: Jeoffrey Massed, MD  [facility] Providers Cardiologist:  None     History of Present Illness: .    Keith Mckee is a 62 y.o. male with a hx of neuropathy, glaucoma, here for the evaluation of palpitations.  He was previously seen by Dr. Rennis Golden in 08/2013 for DOE. Felt mildly short of breath at times, especially when starting to exercise. EKG at that visit showed NSR at 83 bpm. He confirmed a family history of heart disease; his father had CABG at age 3. His grandfather had heart disease.   Cardiovascular risk factors: Prior clinical ASCVD: None.  Comorbid conditions:  Today, he is concerned that he may have atrial fibrillation. He has had irregular heart beats intermittently for years, but lately his palpitations have been occurring daily. He describes episodes with racing heart beats, and/or extremely slow but hard and thumping beats. He has also been feeling very fatigued and is not sleeping well. Typically he is tossing and turning all night, sleeps with his mouth open while on his back. His wife has mentioned he is a "heavy breather" but not does not necessarily snore. At times he does get lightheaded, more frequent now with standing up. In the past, he has been borderline hypotensive. Usually his blood pressure is on the lower end of normal. In the office today his blood pressure is 118/82. Occasionally experiences nocturnal hot flashes without diaphoresis. He also states he has been under a great deal of stress for months now. Metabolic syndrome/Obesity:  Current weight 167 lbs. Chronic inflammatory conditions: None Tobacco use history: Former smoker and used chewing tobacco. He has not used either in several decades.  Family history: His mother has a history of Afib and failed cardioversion, still living. His father had heart disease and underwent CABG, lived to age  57. Prior pertinent testing and/or incidental findings: He had a MET test 09/20/2013 with VO2 of 70%, low risk. Coronary CT 10/2013 showed a coronary calcium score of 0.  Current diet: For the past month, he has also struggled with significant nausea, some days worse than others. May be affected by strong foods and smells. More recently he has to force himself to eat breakfast due to a loss of appetite. Over a weekend he may drink a 12 pack of beer. Last weekend he began to cut back on his alcohol consumption. Otherwise he only drinks water, and one 20 oz cup of coffee a day. No sodas or tea.   He denies any chest pain, shortness of breath, peripheral edema, headaches, syncope, orthopnea, or PND.  ROS:  Please see the history of present illness. ROS otherwise negative except as noted.  (+) Racing and thumping palpitations (+) Fatigue (+) Insomnia (+) Nausea (+) Loss of appetite (+) Occasional lightheadedness (+) Nocturnal hot flashes (+) Stress  Studies Reviewed: Marland Kitchen           Physical Exam:    VS:  BP 118/82 (BP Location: Right Arm, Patient Position: Sitting, Cuff Size: Normal)   Pulse 62   Ht 6\' 1"  (1.854 m)   Wt 167 lb 12.8 oz (76.1 kg)   BMI 22.14 kg/m    Wt Readings from Last 3 Encounters:  03/03/23 167 lb 12.8 oz (76.1 kg)  01/09/23 167 lb 12.8 oz (76.1 kg)  11/19/22 171 lb 6.4 oz (77.7 kg)    GEN: Well nourished,  well developed in no acute distress HEENT: Normal, moist mucous membranes NECK: No JVD CARDIAC: regular rhythm, normal S1 and S2, no rubs or gallops. No murmur. VASCULAR: Radial and DP pulses 2+ bilaterally. No carotid bruits RESPIRATORY:  Clear to auscultation without rales, wheezing or rhonchi  ABDOMEN: Soft, non-tender, non-distended MUSCULOSKELETAL:  Ambulates independently SKIN: Warm and dry, no edema NEUROLOGIC:  Alert and oriented x 3. No focal neuro deficits noted. PSYCHIATRIC:  Normal affect   ASSESSMENT AND PLAN: .    Palpitations -Ordered 7 day  Zio monitor -reviewed red flag warning signs that need immediate medical attention  Nausea -recommend he follow up with his PCP if this persists -checked CMET updated to add: unremarkable  Family history of heart disease CV risk counseling and prevention -recommend heart healthy/Mediterranean diet, with whole grains, fruits, vegetable, fish, lean meats, nuts, and olive oil. Limit salt. -recommend moderate walking, 3-5 times/week for 30-50 minutes each session. Aim for at least 150 minutes.week. Goal should be pace of 3 miles/hours, or walking 1.5 miles in 30 minutes -recommend avoidance of tobacco products. Avoid excess alcohol. -ASCVD risk score: The 10-year ASCVD risk score (Arnett DK, et al., 2019) is: 4.5%   Values used to calculate the score:     Age: 59 years     Sex: Male     Is Non-Hispanic African American: No     Diabetic: No     Tobacco smoker: No     Systolic Blood Pressure: 102 mmHg     Is BP treated: No     HDL Cholesterol: 65 mg/dL     Total Cholesterol: 186 mg/dL     Dispo: Follow-up TBD based on results of testing, or sooner as needed.  I,Mathew Stumpf,acting as a Neurosurgeon for [employer], MD.,have documented all relevant documentation on the behalf of [name], MD,as directed by  [name], MD while in the presence of [name], MD.  I, [name], MD, have reviewed all documentation for this visit. The documentation on 04/09/23 for the exam, diagnosis, procedures, and orders are all accurate and complete.   Signed, [name], MD

## 2023-03-03 NOTE — Patient Instructions (Signed)
Medication Instructions:  Your physician recommends that you continue on your current medications as directed. Please refer to the Current Medication list given to you today.  Testing/Procedures: Your physician has recommended that you wear a Zio monitor.   This monitor is a medical device that records the heart's electrical activity. Doctors most often use these monitors to diagnose arrhythmias. Arrhythmias are problems with the speed or rhythm of the heartbeat. The monitor is a small device applied to your chest. You can wear one while you do your normal daily activities. While wearing this monitor if you have any symptoms to push the button and record what you felt. Once you have worn this monitor for the period of time provider prescribed (Usually 14 days), you will return the monitor device in the postage paid box. Once it is returned they will download the data collected and provide [place] with a report which the provider will then review and we will call you with those results. Important tips:  Avoid showering during the first 24 hours of wearing the monitor. Avoid excessive sweating to help maximize wear time. Do not submerge the device, no hot tubs, and no swimming pools. Keep any lotions or oils away from the patch. After 24 hours you may shower with the patch on. Take brief showers with your back facing the shower head.  Do not remove patch once it has been placed because that will interrupt data and decrease adhesive wear time. Push the button when you have any symptoms and write down what you were feeling. Once you have completed wearing your monitor, remove and place into box which has postage paid and place in your outgoing mailbox.  If for some reason you have misplaced your box then call our office and we can provide another box and/or mail it off for you.   Follow-Up: At [facility], you and your health needs are our priority.  As part of our continuing mission to provide  you with exceptional heart care, we have created designated Provider Care Teams.  These Care Teams include your primary Cardiologist (physician) and Advanced Practice Providers (APPs -  Physician Assistants and Nurse Practitioners) who all work together to provide you with the care you need, when you need it.  We recommend signing up for the patient portal called "MyChart".  Sign up information is provided on this After Visit Summary.  MyChart is used to connect with patients for Virtual Visits (Telemedicine).  Patients are able to view lab/test results, encounter notes, upcoming appointments, etc.  Non-urgent messages can be sent to your provider as well.   To learn more about what you can do with MyChart, go to [email or web address].    Your next appointment:   TBD based on monitor results

## 2023-03-04 ENCOUNTER — Other Ambulatory Visit: Payer: Self-pay | Admitting: Family Medicine

## 2023-03-04 LAB — COMPREHENSIVE METABOLIC PANEL
ALT: 18 IU/L (ref 0–44)
AST: 16 IU/L (ref 0–40)
Albumin: 4.6 g/dL (ref 3.8–4.9)
Alkaline Phosphatase: 81 IU/L (ref 44–121)
BUN/Creatinine Ratio: 15 (ref 10–24)
BUN: 18 mg/dL (ref 8–27)
Bilirubin Total: 0.6 mg/dL (ref 0.0–1.2)
CO2: 23 mmol/L (ref 20–29)
Calcium: 9.6 mg/dL (ref 8.6–10.2)
Chloride: 102 mmol/L (ref 96–106)
Creatinine, Ser: 1.19 mg/dL (ref 0.76–1.27)
Globulin, Total: 2 g/dL (ref 1.5–4.5)
Glucose: 91 mg/dL (ref 70–99)
Potassium: 4.3 mmol/L (ref 3.5–5.2)
Sodium: 140 mmol/L (ref 134–144)
Total Protein: 6.6 g/dL (ref 6.0–8.5)
eGFR: 70 mL/min/{1.73_m2} (ref >59)

## 2023-03-06 MED ORDER — HYDROCODONE-ACETAMINOPHEN 7.5-325 MG PO TABS: 1.0000 | ORAL_TABLET | Freq: Four times a day (QID) | ORAL | 0 refills | Status: DC | PRN

## 2023-03-06 MED ORDER — CLONAZEPAM 0.5 MG PO TABS: 0.5000 mg | ORAL_TABLET | Freq: Two times a day (BID) | ORAL | 0 refills | Status: DC | PRN

## 2023-03-06 NOTE — Telephone Encounter (Signed)
Requesting: clonazePAM (KLONOPIN) 0.5 MG tablet  Contract: 01/09/23 UDS: 01/09/23 Last Visit: 01/09/23 Next Visit: 07/15/23 Last Refill: N/A  Requesting: HYDROcodone-acetaminophen (NORCO) 7.5-325 MG tablet  Contract: 01/09/23 UDS: 01/09/23 Last Visit: 01/09/23 Next Visit: 07/15/23 Last Refill: 07/15/22 (30,0)  Please Advise. Med pending

## 2023-03-14 ENCOUNTER — Other Ambulatory Visit (HOSPITAL_BASED_OUTPATIENT_CLINIC_OR_DEPARTMENT_OTHER): Payer: Self-pay

## 2023-03-14 ENCOUNTER — Encounter (HOSPITAL_BASED_OUTPATIENT_CLINIC_OR_DEPARTMENT_OTHER): Payer: Self-pay | Admitting: Emergency Medicine

## 2023-03-14 ENCOUNTER — Emergency Department (HOSPITAL_BASED_OUTPATIENT_CLINIC_OR_DEPARTMENT_OTHER)
Admission: EM | Admit: 2023-03-14 | Discharge: 2023-03-14 | Disposition: A | Discharge status: Home / Self Care | Payer: 59 | Attending: Emergency Medicine | Admitting: Emergency Medicine

## 2023-03-14 ENCOUNTER — Other Ambulatory Visit: Payer: Self-pay

## 2023-03-14 DIAGNOSIS — L089 Local infection of the skin and subcutaneous tissue, unspecified: Secondary | ICD-10-CM | POA: Diagnosis present

## 2023-03-14 DIAGNOSIS — R21 Rash and other nonspecific skin eruption: Secondary | ICD-10-CM | POA: Diagnosis not present

## 2023-03-14 LAB — LACTIC ACID, PLASMA: Lactic Acid, Venous: 1.1 mmol/L (ref 0.5–1.9)

## 2023-03-14 LAB — URINALYSIS, W/ REFLEX TO CULTURE (INFECTION SUSPECTED)
Bacteria, UA: NONE SEEN
Bilirubin Urine: NEGATIVE
Glucose, UA: NEGATIVE mg/dL
Hgb urine dipstick: NEGATIVE
Ketones, ur: NEGATIVE mg/dL
Leukocytes,Ua: NEGATIVE
Nitrite: NEGATIVE
Protein, ur: NEGATIVE mg/dL
Specific Gravity, Urine: <1.005 — ABNORMAL LOW (ref 1.005–1.030)
pH: 7.5 (ref 5.0–8.0)

## 2023-03-14 LAB — CBC WITH DIFFERENTIAL/PLATELET
Abs Immature Granulocytes: 0.01 10*3/uL (ref 0.00–0.07)
Basophils Absolute: 0 10*3/uL (ref 0.0–0.1)
Basophils Relative: 0 %
Eosinophils Absolute: 0.1 10*3/uL (ref 0.0–0.5)
Eosinophils Relative: 2 %
HCT: 45.6 % (ref 39.0–52.0)
Hemoglobin: 15.8 g/dL (ref 13.0–17.0)
Immature Granulocytes: 0 %
Lymphocytes Relative: 28 %
Lymphs Abs: 1.7 10*3/uL (ref 0.7–4.0)
MCH: 31 pg (ref 26.0–34.0)
MCHC: 34.6 g/dL (ref 30.0–36.0)
MCV: 89.4 fL (ref 80.0–100.0)
Monocytes Absolute: 0.6 10*3/uL (ref 0.1–1.0)
Monocytes Relative: 10 %
Neutro Abs: 3.7 10*3/uL (ref 1.7–7.7)
Neutrophils Relative %: 60 %
Platelets: 146 10*3/uL — ABNORMAL LOW (ref 150–400)
RBC: 5.1 MIL/uL (ref 4.22–5.81)
RDW: 13.1 % (ref 11.5–15.5)
WBC: 6.1 10*3/uL (ref 4.0–10.5)
nRBC: 0 % (ref 0.0–0.2)

## 2023-03-14 LAB — COMPREHENSIVE METABOLIC PANEL
ALT: 14 U/L (ref 0–44)
AST: 16 U/L (ref 15–41)
Albumin: 4.4 g/dL (ref 3.5–5.0)
Alkaline Phosphatase: 69 U/L (ref 38–126)
Anion gap: 6 (ref 5–15)
BUN: 17 mg/dL (ref 6–20)
CO2: 30 mmol/L (ref 22–32)
Calcium: 9.6 mg/dL (ref 8.9–10.3)
Chloride: 105 mmol/L (ref 98–111)
Creatinine, Ser: 1.01 mg/dL (ref 0.61–1.24)
GFR, Estimated: >60 mL/min (ref >60)
Glucose, Bld: 72 mg/dL (ref 70–99)
Potassium: 4.3 mmol/L (ref 3.5–5.1)
Sodium: 141 mmol/L (ref 135–145)
Total Bilirubin: 0.7 mg/dL (ref 0.3–1.2)
Total Protein: 6.7 g/dL (ref 6.5–8.1)

## 2023-03-14 MED ORDER — TRIAMCINOLONE ACETONIDE 0.1 % EX CREA
1.0000 | TOPICAL_CREAM | Freq: Two times a day (BID) | CUTANEOUS | 0 refills | Status: DC
  Filled 2023-03-14: qty 30, 15d supply, fill #0

## 2023-03-14 MED ORDER — LIDOCAINE HCL (PF) 1 % IJ SOLN
5.0000 mL | Freq: Once | INTRAMUSCULAR | Status: AC
  Administered 2023-03-14: 5 mL via INTRADERMAL
  Filled 2023-03-14: qty 5

## 2023-03-14 MED ORDER — BACITRACIN ZINC 500 UNIT/GM EX OINT
TOPICAL_OINTMENT | Freq: Two times a day (BID) | CUTANEOUS | Status: DC
  Administered 2023-03-14: 1 via TOPICAL
  Filled 2023-03-14: qty 28.35

## 2023-03-14 MED ORDER — DOXYCYCLINE HYCLATE 100 MG PO CAPS
100.0000 mg | ORAL_CAPSULE | Freq: Two times a day (BID) | ORAL | 0 refills | Status: DC
  Filled 2023-03-14: qty 20, 10d supply, fill #0

## 2023-03-14 NOTE — ED Notes (Signed)
I&D Tray at bedside.

## 2023-03-14 NOTE — ED Notes (Signed)
ED Provider at bedside. 

## 2023-03-14 NOTE — ED Provider Notes (Signed)
[facility] Provider Note   CSN: [id number] Arrival date & time: 03/14/23  0831     History  Chief Complaint  Patient presents with   Arm Swelling    Keith Mckee is a 62 y.o. male history of polyneuropathy,, IBS, presented with a left middle finger infection and rash on the left forearm.  Patient states for the past 3 days he has had swelling and redness to the nail of his left middle finger and last night he was able to drain pus out of it and now it feels better.  Patient woke up this morning and said that he had a red rash on the left forearm that is not itchy, it is only painful when palpated.  Patient denies warmth to this rash or drainage.  Patient denies new close/soaps/lotions/trauma/medications, fevers, shortness of breath, chest pain, nausea/vomiting, abdominal pain.  Home Medications Prior to Admission medications   Medication Sig Start Date End Date Taking? Authorizing Provider  doxycycline (VIBRAMYCIN) 100 MG capsule Take 1 capsule (100 mg total) by mouth 2 (two) times daily. 03/14/23  Yes Evlyn Kanner T, PA-C  triamcinolone cream (KENALOG) 0.1 % Apply 1 Application topically 2 (two) times daily. 03/14/23  Yes Evlyn Kanner T, PA-C  Alpha-Lipoic Acid 300 MG CAPS Take 1 capsule by mouth 2 (two) times daily. 11/22/17   [provider]  busPIRone (BUSPAR) 15 MG tablet 2 tabs po bid 01/09/23   McGowen, Maryjean Morn, MD  clonazePAM (KLONOPIN) 0.5 MG tablet Take 1 tablet (0.5 mg total) by mouth 2 (two) times daily as needed for anxiety. 03/06/23   McGowen, Maryjean Morn, MD  dorzolamide-timolol (COSOPT) 22.3-6.8 MG/ML ophthalmic solution Place 1 drop into the right eye 2 (two) times daily. 12/04/21   [provider]  gabapentin (NEURONTIN) 600 MG tablet TAKE ONE AND ONE-HALF TABLETS IN THE MORNING AND ONE AND ONE-HALF TABLETS IN THE EVENING 02/21/20   [provider]  HYDROcodone-acetaminophen (NORCO) 7.5-325 MG tablet  Take 1 tablet by mouth every 6 (six) hours as needed for moderate pain. 03/06/23   McGowen, Maryjean Morn, MD  latanoprost (XALATAN) 0.005 % ophthalmic solution Place 1 drop into both eyes at bedtime. 12/20/22   [provider]  Magnesium 300 MG CAPS Take by mouth as needed.     [provider]  Omega-3 Fatty Acids (OMEGA-3 FISH OIL) 300 MG CAPS Take 1 capsule by mouth daily.    [provider]  polyethylene glycol powder (GLYCOLAX/MIRALAX) 17 GM/SCOOP powder Take by mouth.    [provider]  sildenafil (REVATIO) 20 MG tablet 1-5 tabs po qd prn intercourse 05/30/21   McGowen, Maryjean Morn, MD      Allergies    Patient has no known allergies.    Review of Systems   Review of Systems  Physical Exam Updated Vital Signs BP 130/78   Pulse 63   Temp 98 F (36.7 C)   Resp 20   SpO2 100%  Physical Exam Constitutional:      General: He is not in acute distress.    Appearance: He is not ill-appearing or toxic-appearing.  Cardiovascular:     Rate and Rhythm: Normal rate and regular rhythm.     Pulses: Normal pulses.     Heart sounds: Normal heart sounds.  Pulmonary:     Effort: Pulmonary effort is normal. No respiratory distress.     Breath sounds: Normal breath sounds.  Musculoskeletal:     Comments: No bony  abnormalities palpated No signs of flexor tenosynovitis 5 out of 5 left-sided grip strength/wrist extension/flexion Soft compartments Pain not out of proportion  Skin:    General: Skin is warm and dry.     Capillary Refill: Capillary refill takes less than 2 seconds.     Comments: Erythema noted to the left forearm that is now warm to palpation or pruritic but patient states is tender when palpated is a dull sensation Erythema mild fluctuance noted to the left middle finger No swelling or erythema or tenderness or firmness to fat pad of left middle finger  Neurological:     Mental Status: He is alert.     Comments: Sensation intact distally   Psychiatric:        Mood and Affect: Mood normal.     ED Results / Procedures / Treatments   Labs (all labs ordered are listed, but only abnormal results are displayed) Labs Reviewed  CBC WITH DIFFERENTIAL/PLATELET - Abnormal; Notable for the following components:      Result Value   Platelets 146 (*)    All other components within normal limits  URINALYSIS, W/ REFLEX TO CULTURE (INFECTION SUSPECTED) - Abnormal; Notable for the following components:   Color, Urine COLORLESS (*)    Specific Gravity, Urine <1.005 (*)    All other components within normal limits  CULTURE, BLOOD (SINGLE)  LACTIC ACID, PLASMA  COMPREHENSIVE METABOLIC PANEL    EKG None  Radiology No results found.  Procedures Procedures    Medications Ordered in ED Medications  bacitracin ointment (has no administration in time range)  lidocaine (PF) (XYLOCAINE) 1 % injection 5 mL (5 mLs Intradermal Given 03/14/23 0951)    ED Course/ Medical Decision Making/ A&P                                 Medical Decision Making Amount and/or Complexity of Data Reviewed Labs: ordered.   Keith Mckee 62 y.o. presented today for finger infection, rash. Working DDx that I considered at this time includes, but not limited to, paronychia, bacteremia, flexor tenosynovitis, irritant dermatitis/contact dermatitis, lymphangitis, sepsis.  R/o DDx: flexor tenosynovitis, irritant dermatitis/contact dermatitis, lymphangitis, sepsis: These are considered less likely due to history of present illness, physical exam, labs/imaging findings  Review of prior external notes: 03/03/2023 office visit  Unique Tests and My Interpretation:  CBC: Unremarkable CMP: Unremarkable UA: Unremarkable Lactic acid: Unremarkable Blood cultures: Pending  Discussion with Independent Historian: None  Discussion of Management of Tests: None  Risk: Medium: prescription drug management  Risk Stratification Score: none  Staffed with  Trifan, MD  Plan: On exam patient was in no acute distress with stable vitals.  Patient exam does show a rash on the left forearm that is mildly tender to palpation in which pressing on it exudes a dull sensation.  Patient does have mild erythema and fluctuance to the left middle finger and states that the swelling is gone better since he drained yesterday suspicious of possible paronychia.  I spoke to the patient about the possibility of draining the rest of the infection and patient verbalizes understanding of the risks of an I&D and agreed that so finger was numbed and finger was drained in which only bloody material was exuded.  Labs from triage are reassuring and 1 blood culture was ordered as patient is concerned that the suspected paronychia caused his rash.  Patient was not endorsing any systemic symptoms.  Will give doxycycline for paronychia and for rash and also give Kenalog cream for the rash in case it is an irritant dermatitis is a very linear rash.  Patient was given return precautions. Patient stable for discharge at this time.  Patient verbalized understanding of plan.         Final Clinical Impression(s) / ED Diagnoses Final diagnoses:  Finger infection  Rash    Rx / DC Orders ED Discharge Orders          Ordered    doxycycline (VIBRAMYCIN) 100 MG capsule  2 times daily        03/14/23 1110    triamcinolone cream (KENALOG) 0.1 %  2 times daily        03/14/23 1110              Keith Mckee 03/14/23 1122    Terald Sleeper, MD 03/14/23 1135

## 2023-03-14 NOTE — Discharge Instructions (Signed)
Please follow-up with your primary care provider regarding recent symptoms and ER visit.  Today your labs were reassuring however we also drew blood cultures and so if these become positive you should receive a phone call in the next few days to return to the ER.  Today your finger was drained and you are given bacitracin ointment and it was wrapped however have also prescribed for you antibiotics to take as well.  I have prescribed for you steroid cream as well for your rash in case this is an irritant skin infection.  If symptoms change or worsen please return to the ER.

## 2023-03-14 NOTE — ED Triage Notes (Addendum)
Pt arrives pov, steady gait, c/o possible LT middle finger infection x 3 days, pain with red streaking on LUE today

## 2023-03-19 LAB — CULTURE, BLOOD (SINGLE)
Culture: NO GROWTH
Special Requests: ADEQUATE

## 2023-03-20 ENCOUNTER — Ambulatory Visit (INDEPENDENT_AMBULATORY_CARE_PROVIDER_SITE_OTHER): Payer: 59 | Admitting: Family Medicine

## 2023-03-20 ENCOUNTER — Encounter: Payer: Self-pay | Admitting: Family Medicine

## 2023-03-20 VITALS — BP 102/66 | HR 60 | Temp 98.2°F | Wt 166.0 lb

## 2023-03-20 DIAGNOSIS — F4323 Adjustment disorder with mixed anxiety and depressed mood: Secondary | ICD-10-CM

## 2023-03-20 DIAGNOSIS — F341 Dysthymic disorder: Secondary | ICD-10-CM | POA: Diagnosis not present

## 2023-03-20 DIAGNOSIS — R110 Nausea: Secondary | ICD-10-CM | POA: Diagnosis not present

## 2023-03-20 MED ORDER — BUSPIRONE HCL 15 MG PO TABS: ORAL_TABLET | ORAL | 1 refills | Status: DC

## 2023-03-20 MED ORDER — ONDANSETRON HCL 8 MG PO TABS: 8.0000 mg | ORAL_TABLET | Freq: Three times a day (TID) | ORAL | 1 refills | Status: DC | PRN

## 2023-03-20 NOTE — Progress Notes (Signed)
OFFICE VISIT  03/20/2023  CC:  Chief Complaint  Patient presents with   Nausea    Nausea that's been going on for a few months now. Denies other symptoms. He states his Bms have been tar like.    Patient is a 62 y.o. male who presents for nausea.  HPI: Has had chronic nausea for the last 2 to 3 months. No vomiting.  Intensity waxes and wanes.  He forces himself to eat.  No abdominal pain.  No diarrhea or constipation.  He has not tried any over-the-counter medications.  He has not felt any heartburn or excessive gas. No fever, no abnormal weight loss or weight gain. Symptoms do not change with eating. The biggest thing affecting him in his life is the stress and anxiety caused by his daughter.  She has some mental illness. She states "it is ruining my life".  He is not going fishing or doing woodworking.  Does not feel motivated to do anything.  He has anhedonia.  He takes one half of a 15 mg BuSpar tab at bedtime lately and this helps him sleep.  He does not take any during the day. He occasionally takes clonazepam as needed anxiety.  Recent paronychia with rash on hand/arm, ED prescribed doxycycline after incision and drainage.  Also triamcinolone cream.  ROS as above, plus-->  no CP, no SOB, no wheezing, no cough, no dizziness, no HAs, no rashes, no melena/hematochezia.  No polyuria or polydipsia.  No myalgias or arthralgias.  No focal weakness, paresthesias, or tremors.  No acute vision or hearing abnormalities.  No dysuria or unusual/new urinary urgency or frequency.  No recent changes in lower legs.   No palpitations.     Past Medical History:  Diagnosis Date   Anxiety    BPH (benign prostatic hypertrophy)    Cataract    artifiicial lenses in both eyes   Cervical spondylosis    with radiculopathy not responsive to conservative therapies:  [facility]: ACDF surgery C5-C7 performed 04/05/15   Chronic pain syndrome    cervicalgia, heredetary PN, LBP    COVID-19 virus infection 11/2020   Difficulty controlling anger    + GAD with OCD tendencies + memory changes---consider neuropsych testing   DISH (diffuse idiopathic skeletal hyperostosis)    Dysphagia    Dysplastic nevus 10/2015   left mid back: Dr. Jorja Loa following   GERD (gastroesophageal reflux disease)    +LPR (ENT 09/2022)   Glaucoma    History of adenomatous polyp of colon    IBS (irritable bowel syndrome)    +constipation   Microscopic colitis 06/2019   colonoscopy + 06/2019   Neuropathic pain    Peripheral neuropathy    Idiopathic, inherited per pt's neurologist, Dr. Antonietta Barcelona. Various labs normal 07/2016 except vitamin B6 was siginificantly elevated.  Allergist eval/allergy testing (including food) ALL NORMAL/NEG 07/2016.   Restless legs syndrome 09/27/2014   Throat irritation    chronic (since cervical spine surgery 2016).  Dr. Lazarus Salines 2017->laryngoscopy normal, CT sinuses normal, barium swallow normal, trial of protonix no help.     Past Surgical History:  Procedure Laterality Date   Ant cerv decompression/discectomy w/fusion C 5-7  04/05/15   C5-7 ACDF   CATARACT EXTRACTION Bilateral    COLONOSCOPY  06/29/2019   FOR EVAL OF UNEXPLAINED DIARRHEA-->normal, bx c/w microscopic colitis->uceris and budesonide rx'd by Dr. Lavon Paganini 06/2019   COLONOSCOPY W/ POLYPECTOMY  08/2012; 05/23/16   2017, adenomatous polyp--recall 5 yrs.   EYE  SURGERY Right 09/06/2020   TRABECULECTOMY Bilateral    for uncontrolled glaucoma    TRANSURETHRAL RESECTION OF PROSTATE  2008   due to enlarged prostate   VASECTOMY  2001    Outpatient Medications Prior to Visit  Medication Sig Dispense Refill   Alpha-Lipoic Acid 300 MG CAPS Take 1 capsule by mouth 2 (two) times daily.     clonazePAM (KLONOPIN) 0.5 MG tablet Take 1 tablet (0.5 mg total) by mouth 2 (two) times daily as needed for anxiety. 120 tablet 0   dorzolamide-timolol (COSOPT) 22.3-6.8 MG/ML ophthalmic solution Place 1 drop into the right eye  2 (two) times daily.     doxycycline (VIBRAMYCIN) 100 MG capsule Take 1 capsule (100 mg total) by mouth 2 (two) times daily. 20 capsule 0   gabapentin (NEURONTIN) 600 MG tablet TAKE ONE AND ONE-HALF TABLETS IN THE MORNING AND ONE AND ONE-HALF TABLETS IN THE EVENING     HYDROcodone-acetaminophen (NORCO) 7.5-325 MG tablet Take 1 tablet by mouth every 6 (six) hours as needed for moderate pain. 30 tablet 0   latanoprost (XALATAN) 0.005 % ophthalmic solution Place 1 drop into both eyes at bedtime.     Magnesium 300 MG CAPS Take by mouth as needed.      Omega-3 Fatty Acids (OMEGA-3 FISH OIL) 300 MG CAPS Take 1 capsule by mouth daily.     polyethylene glycol powder (GLYCOLAX/MIRALAX) 17 GM/SCOOP powder Take by mouth.     sildenafil (REVATIO) 20 MG tablet 1-5 tabs po qd prn intercourse 30 tablet 11   busPIRone (BUSPAR) 15 MG tablet 2 tabs po bid 360 tablet 1   triamcinolone cream (KENALOG) 0.1 % Apply 1 Application topically 2 (two) times daily. (Patient not taking: Reported on 03/20/2023) 30 g 0   No facility-administered medications prior to visit.    No Known Allergies  Review of Systems  As per HPI  PE:    03/20/2023    1:05 PM 03/14/2023    8:59 AM 03/03/2023    3:40 PM  Vitals with BMI  Weight 166 lbs    BMI 21.91    Systolic 102 130 161  Diastolic 66 78 82  Pulse 60 63      Physical Exam  Gen: Alert, well appearing.  Patient is oriented to person, place, time, and situation. AFFECT: pleasant, lucid thought and speech. CV: RRR, no m/r/g.   LUNGS: CTA bilat, nonlabored resps, good aeration in all lung fields. ABD: soft, NT, ND, BS normal.  No hepatospenomegaly or mass.  No bruits.  LABS:  Last CBC Lab Results  Component Value Date   WBC 6.1 03/14/2023   HGB 15.8 03/14/2023   HCT 45.6 03/14/2023   MCV 89.4 03/14/2023   MCH 31.0 03/14/2023   RDW 13.1 03/14/2023   PLT 146 (L) 03/14/2023   Last metabolic panel Lab Results  Component Value Date   GLUCOSE 72 03/14/2023    NA 141 03/14/2023   K 4.3 03/14/2023   CL 105 03/14/2023   CO2 30 03/14/2023   BUN 17 03/14/2023   CREATININE 1.01 03/14/2023   GFRNONAA >60 03/14/2023   CALCIUM 9.6 03/14/2023   PROT 6.7 03/14/2023   ALBUMIN 4.4 03/14/2023   LABGLOB 2.0 03/03/2023   BILITOT 0.7 03/14/2023   ALKPHOS 69 03/14/2023   AST 16 03/14/2023   ALT 14 03/14/2023   ANIONGAP 6 03/14/2023   Lab Results  Component Value Date   LIPASE 40 07/31/2018   IMPRESSION AND PLAN:  Chronic nausea.  Metabolic panel and CBC have been normal. Will check lipase and right upper quadrant limited abdominal ultrasound. Strongly suspect this is due to the extreme stress and anxiety he is suffering due to the strained relationship with his mentally ill daughter. He is going to look into counseling. We have tried several different antidepressants in the past and they either have not been effective or have caused adverse side effects. Refilled buspirone 15 mg, which she will continue taking half to 1 tab nightly for anxiety and sleep.   Continue clonazepam 0.5 mg twice daily as needed anxiety.  Zofran 8 mg, 1 tab 3 times daily as needed, #60, refill x 1.  An After Visit Summary was printed and given to the patient.  FOLLOW UP: Return in about 4 weeks (around 04/17/2023) for f/u chronic nausea +anx/dep.  Signed:  Santiago Bumpers, MD           03/20/2023

## 2023-03-21 ENCOUNTER — Encounter: Payer: Self-pay | Admitting: Family Medicine

## 2023-03-21 LAB — LIPASE: Lipase: 43 U/L (ref 7–60)

## 2023-03-24 NOTE — Telephone Encounter (Signed)
FYI  Please see below

## 2023-04-04 ENCOUNTER — Ambulatory Visit (HOSPITAL_BASED_OUTPATIENT_CLINIC_OR_DEPARTMENT_OTHER)
Admission: RE | Admit: 2023-04-04 | Discharge: 2023-04-04 | Disposition: A | Discharge status: Home / Self Care | Payer: 59 | Source: Ambulatory Visit | Attending: Family Medicine | Admitting: Family Medicine

## 2023-04-04 DIAGNOSIS — R110 Nausea: Secondary | ICD-10-CM | POA: Insufficient documentation

## 2023-04-14 ENCOUNTER — Encounter (HOSPITAL_BASED_OUTPATIENT_CLINIC_OR_DEPARTMENT_OTHER): Payer: Self-pay

## 2023-04-17 ENCOUNTER — Encounter: Payer: Self-pay | Admitting: Family Medicine

## 2023-04-17 ENCOUNTER — Ambulatory Visit: Payer: 59 | Admitting: Family Medicine

## 2023-04-17 VITALS — BP 102/66 | HR 52 | Wt 165.0 lb

## 2023-04-17 DIAGNOSIS — R110 Nausea: Secondary | ICD-10-CM | POA: Diagnosis not present

## 2023-04-17 DIAGNOSIS — R6882 Decreased libido: Secondary | ICD-10-CM

## 2023-04-17 DIAGNOSIS — F339 Major depressive disorder, recurrent, unspecified: Secondary | ICD-10-CM | POA: Diagnosis not present

## 2023-04-17 DIAGNOSIS — R1013 Epigastric pain: Secondary | ICD-10-CM | POA: Diagnosis not present

## 2023-04-17 NOTE — Progress Notes (Signed)
OFFICE VISIT  04/17/2023  CC:  Chief Complaint  Patient presents with   Nausea   Anxiety   Depression    Patient is a 61 y.o. male who presents for 1 month follow-up chronic nausea as well as anxiety and depression. A/P as of last visit: "Chronic nausea. Metabolic panel and CBC have been normal. Will check lipase and right upper quadrant limited abdominal ultrasound. Strongly suspect this is due to the extreme stress and anxiety he is suffering due to the strained relationship with his mentally ill daughter. He is going to look into counseling. We have tried several different antidepressants in the past and they either have not been effective or have caused adverse side effects. Refilled buspirone 15 mg, which she will continue taking half to 1 tab nightly for anxiety and sleep.   Continue clonazepam 0.5 mg twice daily as needed anxiety. Zofran 8 mg, 1 tab 3 times daily as needed, #60, refill x 1."  INTERIM HX: His right upper quadrant abdominal ultrasound was normal. Lab work has all been normal.  No change in his chronic nausea. Of note, he does have a history of GERD.  He was diagnosed with this after getting a workup when he saw the ENT earlier this year.  It was recommended he start on PPI but he is apprehensive about this medication. It was suggested then he take Pepcid 20 mg twice a day but he admits he did not do this. He does not commonly feel arising sense of indigestion or any significant heartburn.  He does describe a severe episode of this within the last week though. He has an occasional raw feeling in the mid epigastric area but no distinct abdominal pain. No vomiting or fever.  He has erectile dysfunction and slightly decreased libido.  Together, with his fatigue and poor sleeping and mood problems he wonders if looking into testosterone level is an option.  Past Medical History:  Diagnosis Date   Anxiety    BPH (benign prostatic hypertrophy)    Cataract     artifiicial lenses in both eyes   Cervical spondylosis    with radiculopathy not responsive to conservative therapies:  [facility]: ACDF surgery C5-C7 performed 04/05/15   Chronic pain syndrome    cervicalgia, heredetary PN, LBP   COVID-19 virus infection 11/2020   Difficulty controlling anger    + GAD with OCD tendencies + memory changes---consider neuropsych testing   DISH (diffuse idiopathic skeletal hyperostosis)    Dysphagia    Dysplastic nevus 10/2015   left mid back: Dr. Jorja Loa following   GERD (gastroesophageal reflux disease)    +LPR (ENT 09/2022)   Glaucoma    History of adenomatous polyp of colon    IBS (irritable bowel syndrome)    +constipation   Microscopic colitis 06/2019   colonoscopy + 06/2019   Neuropathic pain    Peripheral neuropathy    Idiopathic, inherited per pt's neurologist, Dr. Antonietta Barcelona. Various labs normal 07/2016 except vitamin B6 was siginificantly elevated.  Allergist eval/allergy testing (including food) ALL NORMAL/NEG 07/2016.   Restless legs syndrome 09/27/2014   Throat irritation    chronic (since cervical spine surgery 2016).  Dr. Lazarus Salines 2017->laryngoscopy normal, CT sinuses normal, barium swallow normal, trial of protonix no help.     Past Surgical History:  Procedure Laterality Date   Ant cerv decompression/discectomy w/fusion C 5-7  04/05/15   C5-7 ACDF   CATARACT EXTRACTION Bilateral    COLONOSCOPY  06/29/2019  FOR EVAL OF UNEXPLAINED DIARRHEA-->normal, bx c/w microscopic colitis->uceris and budesonide rx'd by Dr. Lavon Paganini 06/2019   COLONOSCOPY W/ POLYPECTOMY  08/2012; 05/23/16   2017, adenomatous polyp--recall 5 yrs.   EYE SURGERY Right 09/06/2020   TRABECULECTOMY Bilateral    for uncontrolled glaucoma    TRANSURETHRAL RESECTION OF PROSTATE  2008   due to enlarged prostate   VASECTOMY  2001    Outpatient Medications Prior to Visit  Medication Sig Dispense Refill   Alpha-Lipoic Acid 300 MG CAPS Take 1 capsule by  mouth 2 (two) times daily.     busPIRone (BUSPAR) 15 MG tablet 1/2 to 1 tab po qhs 90 tablet 1   clonazePAM (KLONOPIN) 0.5 MG tablet Take 1 tablet (0.5 mg total) by mouth 2 (two) times daily as needed for anxiety. 120 tablet 0   dorzolamide-timolol (COSOPT) 22.3-6.8 MG/ML ophthalmic solution Place 1 drop into the right eye 2 (two) times daily.     gabapentin (NEURONTIN) 600 MG tablet TAKE ONE AND ONE-HALF TABLETS IN THE MORNING AND ONE AND ONE-HALF TABLETS IN THE EVENING     HYDROcodone-acetaminophen (NORCO) 7.5-325 MG tablet Take 1 tablet by mouth every 6 (six) hours as needed for moderate pain. 30 tablet 0   latanoprost (XALATAN) 0.005 % ophthalmic solution Place 1 drop into both eyes at bedtime.     Magnesium 300 MG CAPS Take by mouth as needed.      Omega-3 Fatty Acids (OMEGA-3 FISH OIL) 300 MG CAPS Take 1 capsule by mouth daily.     ondansetron (ZOFRAN) 8 MG tablet Take 1 tablet (8 mg total) by mouth every 8 (eight) hours as needed for nausea or vomiting. 60 tablet 1   polyethylene glycol powder (GLYCOLAX/MIRALAX) 17 GM/SCOOP powder Take by mouth.     sildenafil (REVATIO) 20 MG tablet 1-5 tabs po qd prn intercourse 30 tablet 11   doxycycline (VIBRAMYCIN) 100 MG capsule Take 1 capsule (100 mg total) by mouth 2 (two) times daily. 20 capsule 0   triamcinolone cream (KENALOG) 0.1 % Apply 1 Application topically 2 (two) times daily. 30 g 0   No facility-administered medications prior to visit.    No Known Allergies  Review of Systems As per HPI  PE:    04/17/2023    9:39 AM 03/20/2023    1:05 PM 03/14/2023    8:59 AM  Vitals with BMI  Weight 165 lbs 166 lbs   BMI  21.91   Systolic 102 102 629  Diastolic 66 66 78  Pulse 52 60 63     Physical Exam  Gen: Alert, well appearing.  Patient is oriented to person, place, time, and situation. AFFECT: pleasant, lucid thought and speech. No further exam today.  LABS:  Last CBC Lab Results  Component Value Date   WBC 6.1 03/14/2023    HGB 15.8 03/14/2023   HCT 45.6 03/14/2023   MCV 89.4 03/14/2023   MCH 31.0 03/14/2023   RDW 13.1 03/14/2023   PLT 146 (L) 03/14/2023   Last metabolic panel Lab Results  Component Value Date   GLUCOSE 72 03/14/2023   NA 141 03/14/2023   K 4.3 03/14/2023   CL 105 03/14/2023   CO2 30 03/14/2023   BUN 17 03/14/2023   CREATININE 1.01 03/14/2023   GFRNONAA >60 03/14/2023   CALCIUM 9.6 03/14/2023   PROT 6.7 03/14/2023   ALBUMIN 4.4 03/14/2023   LABGLOB 2.0 03/03/2023   BILITOT 0.7 03/14/2023   ALKPHOS 69 03/14/2023   AST 16 03/14/2023  ALT 14 03/14/2023   ANIONGAP 6 03/14/2023   Lab Results  Component Value Date   LIPASE 43 03/20/2023   IMPRESSION AND PLAN:  #1 chronic nausea.  General lab panel and right upper quadrant ultrasound normal. Suspect dyspepsia, likely stress-induced. Since he is very apprehensive about proton pump inhibitors we decided that he should try over-the-counter famotidine 20 mg twice a day for at least a 2-week trial. At this point he does not plan on going to the gastroenterologist for any further evaluation.  #2 depression, anxiety, difficulty controlling anger, insomnia. Many medication trials.  Unfortunately, he has either not tolerated them or they did not work. He seems to get some benefit out of taking BuSpar one half of a 15 mg tab nightly. He also has Klonopin 0.5 mg to take 1 twice daily as needed.  He is not take this very often. He has started counseling, first visit was yesterday.  #3 decreased libido, erectile dysfunction. Check testosterone level early morning tomorrow fasting.  An After Visit Summary was printed and given to the patient.  FOLLOW UP: Return in about 6 weeks (around 05/29/2023) for routine chronic illness f/u.  Signed:  Santiago Bumpers, MD           04/17/2023

## 2023-04-17 NOTE — Patient Instructions (Signed)
Start over the counter famotidine 20mg  twice a day and take this for at least 2 weeks.

## 2023-04-18 ENCOUNTER — Other Ambulatory Visit (INDEPENDENT_AMBULATORY_CARE_PROVIDER_SITE_OTHER): Payer: 59

## 2023-04-18 DIAGNOSIS — R6882 Decreased libido: Secondary | ICD-10-CM

## 2023-04-18 NOTE — Progress Notes (Signed)
Pt came for labs only, tolerated draw well.   

## 2023-04-19 LAB — TESTOSTERONE: Testosterone: 534 ng/dL (ref 250–827)

## 2023-04-29 NOTE — Telephone Encounter (Signed)
Secure chat sent to Dr. Cristal Deer asking for review

## 2023-05-20 ENCOUNTER — Encounter: Payer: Self-pay | Admitting: Nurse Practitioner

## 2023-05-29 ENCOUNTER — Ambulatory Visit: Payer: 59 | Admitting: Nurse Practitioner

## 2023-05-29 ENCOUNTER — Encounter: Payer: Self-pay | Admitting: Nurse Practitioner

## 2023-05-29 VITALS — BP 120/68 | HR 77 | Ht 72.0 in | Wt 168.2 lb

## 2023-05-29 DIAGNOSIS — R110 Nausea: Secondary | ICD-10-CM | POA: Diagnosis not present

## 2023-05-29 DIAGNOSIS — R933 Abnormal findings on diagnostic imaging of other parts of digestive tract: Secondary | ICD-10-CM

## 2023-05-29 DIAGNOSIS — K219 Gastro-esophageal reflux disease without esophagitis: Secondary | ICD-10-CM

## 2023-05-29 DIAGNOSIS — F419 Anxiety disorder, unspecified: Secondary | ICD-10-CM

## 2023-05-29 DIAGNOSIS — R1310 Dysphagia, unspecified: Secondary | ICD-10-CM

## 2023-05-29 DIAGNOSIS — K5909 Other constipation: Secondary | ICD-10-CM

## 2023-05-29 DIAGNOSIS — Z860101 Personal history of adenomatous and serrated colon polyps: Secondary | ICD-10-CM

## 2023-05-29 DIAGNOSIS — R3911 Hesitancy of micturition: Secondary | ICD-10-CM

## 2023-05-29 DIAGNOSIS — Z8719 Personal history of other diseases of the digestive system: Secondary | ICD-10-CM

## 2023-05-29 NOTE — Patient Instructions (Addendum)
Acid Reflux  Below are some measures you can take to possibly improve acid reflux symptoms . We may have discussed some of these today in the office. Not everything on this list may apply to you   --If you are taking anti-reflux ( GERD) medication be sure to take it 30 minutes before breakfast and if taking twice daily then also second dose should be 30 minutes before dinner.   --Avoid late meals / bedtime snacks.   --Avoid trigger foods ( foods which you know tend to aggravate you reflux symptoms). Some common trigger foods include spicy foods, fatty foods, acidic foods, chocolate and caffeine.  --Elevate the head of bed 6-8 inches on blocks or bricks. If not able to elevate the head of the bed consider purchasing a wedge pillow to sleep on.    --Weight reduction / maintain a healthy BMI ( body mass index) may be help with reflux symptoms  --Sometimes with the above mentioned "lifestyle changes" patients are able to reduce the amount of GERD medications they take. Our goal is to have you on the lowest effective dose of medication    It is ok to reduce Famotidine to 10 mg twice daily  You have been scheduled for an endoscopy. Please follow written instructions given to you at your visit today.  If you use inhalers (even only as needed), please bring them with you on the day of your procedure.  If you take any of the following medications, they will need to be adjusted prior to your procedure:   DO NOT TAKE 7 DAYS PRIOR TO TEST- Trulicity (dulaglutide) Ozempic, Wegovy (semaglutide) Mounjaro (tirzepatide) Bydureon Bcise (exanatide extended release)  DO NOT TAKE 1 DAY PRIOR TO YOUR TEST Rybelsus (semaglutide) Adlyxin (lixisenatide) Victoza (liraglutide) Byetta (exanatide) ___________________________________________________________________________    Due to recent changes in healthcare laws, you may see the results of your imaging and laboratory studies on MyChart before your provider  has had a chance to review them.  We understand that in some cases there may be results that are confusing or concerning to you. Not all laboratory results come back in the same time frame and the provider may be waiting for multiple results in order to interpret others.  Please give [place] 48 hours in order for your provider to thoroughly review all the results before contacting the office for clarification of your results.   Thank you for choosing me and [facility].  Willette Cluster, NP

## 2023-05-29 NOTE — Progress Notes (Signed)
Brief Narrative 62 y.o. yo male known remotedly to Dr. Lavon Paganini with a past medical history not limited to lymphocytic colitis ( 2021), adenomatous colon polyps, anxiety, glaucoma s/p surgeries. Referred by PCP for nausea  ASSESSMENT    Nausea without vomiting ( started in August).  PUD? Stress contributing? Gastric neoplasm seems unlikely in absence of significant weight loss.  Symptoms improved but not resolved with famotidine.  GERD. Asymptomatic for years off treatment but UGI series ( done for dysphagia) in May showed mild reflux.   Dysphagia. Was having problems swallowing saliva ( not food). Mild abnormality on esophagram in May 2024 ([facility]) showing minimal transient stasis of the barium tablet at the level of C6  Improved with exercises given by SLP.   History of lymphocytic colitis, possibly related to SSRI use.  No recurrence of diarrhea  Chronic constipation Manges well with daily Miralax  History of adenomatous colon polyps.  He is up to date on surveillance colonoscopy   Chronic thrombocytopenia, ? Etiology. Monitored by PCP and supsected to have mild immune thrombocytopenia Platelets 140's. No evidence of cirrhosis on RUQ .   Anxiety, on Buspar.   Urinary hesitancy.  Sees Urology soon   PLAN    --Schedule for EGD to evaluate nausea and May 2024 esophagram findings.  The risks and benefits of EGD with possible biopsies were discussed with the patient who agrees to proceed.  --Advised not to take any NSAIDs until we can rule out PUD.  --No GERD symptoms despite mild reflux on esophagram so he would like to reduce famotidine dose as long as nausea doesn't get worse. Ok to reduce Famotidine to 10 mg twice daily but encouraged anti-reflux measures and provided those in AVS.  --Stop THC. Doesn't use on regular basis so unlikely culprit but still recommended discontinuation   HPI   Chief complaint : nausea  Dysphagia Several months ago patient developed  swallowing problems unrelated to eating. Felt like saliva was going "into windpipe". Got upper GI series in May 2024 which showed reflux, minimal transient stasis at the level of C6 and clavicles and slightly slow transit through distal esophagus . Saw SLP, given exercises to help with swallowing and symptoms improved.   Nausea.  Around August Keith Mckee began having almost constant nausea with associated vomiting. Nausea worse with etoh intake. He drinks a 12 pack of beer on weekends. No recent medication changes to correlate with nausea. Was taking 600 mg of Ibuprofen but only about one dose every couple of weeks. Takes opioids but only about once a week. Uses THC but only a  small amount twice month.  Nausea didn't affect appetite though he has lost a few pounds. He hasn't had any associated abdominal pain. No associated dizziness. Saw PCP, got gallbladder US and labs and told everything was okay. Started OTC Famotidine 20 mg BID and has had significant improvement in the nausea but still has queasiness at times    GERD Years ago he had terrible reflux. After after making dietary changes he was able to stop medication several years ago. The UGI series in May showed mild reflux so he is hesitant to stop Famotidine, especially his nausea has improved.    GI History / Studies   **May not be a complete list of studies  04/04/23 RUQ [place] No cholelithiasis or sonographic evidence for acute cholecystitis.   May 2024 Esophagram 1. Small volume gastroesophageal reflux.  2.  Slightly slow transit through the distal esophagus and gastroesophageal  junction with otherwise normal esophageal motility.  3.  13 mm barium tablet passed rapidly through the esophagus and GE junction with minimal transient stasis at the level of C6 and clavicles.   Jan 2021 colonoscopy for diarrhea -Normal colonic mucosa throughout.   Diagnosis 1. Surgical [P], right colon - FINDINGS CONSISTENT WITH LYMPHOCYTIC COLITIS. - NO  DYSPLASIA OR MALIGNANCY. 2. Surgical [P], left colon - FINDINGS CONSISTENT WITH LYMPHOCYTIC COLITIS. - NO DYSPLASIA OR MALIGNANCY   05/23/16 Colonoscopy  2 mm polyp (tubular adenoma) from ascending colon, 6 mm polyp (hyperplastic ) removed from rectum and internal hemorrhoids    Labs      Latest Ref Rng & Units 03/14/2023    9:35 AM 03/15/2022    2:10 PM 02/21/2022    3:46 PM  CBC  WBC 4.0 - 10.5 K/uL 6.1  5.1  5.7   Hemoglobin 13.0 - 17.0 g/dL 82.9  56.2  13.0   Hematocrit 39.0 - 52.0 % 45.6  47.1  46.1   Platelets 150 - 400 K/uL 146  164.0  149.0     Lab Results  Component Value Date   LIPASE 43 03/20/2023      Latest Ref Rng & Units 03/14/2023    9:35 AM 03/03/2023    4:45 PM 03/15/2022    2:10 PM  CMP  Glucose 70 - 99 mg/dL 72  91  59   BUN 6 - 20 mg/dL 17  18  17    Creatinine 0.61 - 1.24 mg/dL 8.65  7.84  6.96   Sodium 135 - 145 mmol/L 141  140  142   Potassium 3.5 - 5.1 mmol/L 4.3  4.3  4.1   Chloride 98 - 111 mmol/L 105  102  103   CO2 22 - 32 mmol/L 30  23  31    Calcium 8.9 - 10.3 mg/dL 9.6  9.6  9.7   Total Protein 6.5 - 8.1 g/dL 6.7  6.6  6.9   Total Bilirubin 0.3 - 1.2 mg/dL 0.7  0.6  0.7   Alkaline Phos 38 - 126 U/L 69  81  74   AST 15 - 41 U/L 16  16  14    ALT 0 - 44 U/L 14  18  15          LONG TERM MONITOR (3-14 DAYS) Patch Wear Time:  7 days and 2 hours (2024-09-09T16:34:36-0400 to  2024-09-16T19:08:19-0400)  Patient had a min HR of 44 bpm, max HR of 174 bpm, and avg HR of 65 bpm.  Predominant underlying rhythm was Sinus Rhythm. 3 Supraventricular  Tachycardia runs occurred, the run with the fastest interval lasting 5  beats with a max rate of 174 bpm, the longest lasting 6 beats with an avg  rate of 118 bpm. No VT, atrial fibrillation, high degree block, or pauses  noted. Isolated atrial and ventricular ectopy was rare (<1%). There were  18 triggered events. 12 were sinus with a single ectopic beat, 3 were  sinus with no ectopic beats, and 3  were sinus with multiple atrial ectopic  beats (two couplets/triplets, one with 6 beats of SVT). No high risk  arrhythmias detected.     Past Medical History:  Diagnosis Date   Anxiety    BPH (benign prostatic hypertrophy)    Cataract    artifiicial lenses in both eyes   Cervical spondylosis    with radiculopathy not responsive to conservative therapies:  [facility] 02/2015: ACDF surgery C5-C7 performed 04/05/15  Chronic pain syndrome    cervicalgia, heredetary PN, LBP   COVID-19 virus infection 11/2020   Depression    Difficulty controlling anger    + GAD with OCD tendencies + memory changes---consider neuropsych testing   DISH (diffuse idiopathic skeletal hyperostosis)    Dysphagia    Dysplastic nevus 10/2015   left mid back: Dr. Jorja Loa following   GERD (gastroesophageal reflux disease)    +LPR (ENT 09/2022)   Glaucoma    History of adenomatous polyp of colon    IBS (irritable bowel syndrome)    +constipation   Microscopic colitis 06/2019   colonoscopy + 06/2019   Neuropathic pain    Peripheral neuropathy    Idiopathic, inherited per pt's neurologist, Dr. Antonietta Barcelona. Various labs normal 07/2016 except vitamin B6 was siginificantly elevated.  Allergist eval/allergy testing (including food) ALL NORMAL/NEG 07/2016.   Restless legs syndrome 09/27/2014   Throat irritation    chronic (since cervical spine surgery 2016).  Dr. Lazarus Salines 2017->laryngoscopy normal, CT sinuses normal, barium swallow normal, trial of protonix no help.    Past Surgical History:  Procedure Laterality Date   Ant cerv decompression/discectomy w/fusion C 5-7  04/05/15   C5-7 ACDF   CATARACT EXTRACTION Bilateral    COLONOSCOPY  06/29/2019   FOR EVAL OF UNEXPLAINED DIARRHEA-->normal, bx c/w microscopic colitis->uceris and budesonide rx'd by Dr. Lavon Paganini 06/2019   COLONOSCOPY W/ POLYPECTOMY  08/2012; 05/23/16   2017, adenomatous polyp--recall 5 yrs.   EYE SURGERY Right 09/06/2020   TRABECULECTOMY  Bilateral    for uncontrolled glaucoma    TRANSURETHRAL RESECTION OF PROSTATE  2008   due to enlarged prostate   VASECTOMY  2001   Family History  Problem Relation Age of Onset   Glaucoma Father    Heart disease Father    Neuropathy Father        Peripheral neuropathy   Arthritis Father    Parkinson's disease Father    Schizophrenia Daughter    Colon cancer Neg Hx    Rectal cancer Neg Hx    Stomach cancer Neg Hx    Social History   Tobacco Use   Smoking status: Former    Current packs/day: 0.00    Average packs/day: 0.8 packs/day for 15.0 years (11.3 ttl pk-yrs)    Types: Cigarettes    Start date: 06/25/1983    Quit date: 06/24/1998    Years since quitting: 24.9   Smokeless tobacco: Never   Tobacco comments:    2 pack per week  Vaping Use   Vaping status: Never Used  Substance Use Topics   Alcohol use: Yes    Alcohol/week: 12.0 standard drinks of alcohol    Types: 12 Cans of beer per week    Comment: Patient drinks 12 beers a week   Drug use: No   Current Outpatient Medications  Medication Sig Dispense Refill   Alpha-Lipoic Acid 300 MG CAPS Take 1 capsule by mouth 2 (two) times daily.     busPIRone (BUSPAR) 15 MG tablet 1/2 to 1 tab po qhs 90 tablet 1   clonazePAM (KLONOPIN) 0.5 MG tablet Take 1 tablet (0.5 mg total) by mouth 2 (two) times daily as needed for anxiety. 120 tablet 0   dorzolamide-timolol (COSOPT) 22.3-6.8 MG/ML ophthalmic solution Place 1 drop into the right eye 2 (two) times daily.     gabapentin (NEURONTIN) 600 MG tablet Take 600 mg by mouth. Take 2 tablets in the morning & 2 in the evening     HYDROcodone-acetaminophen (  NORCO) 7.5-325 MG tablet Take 1 tablet by mouth every 6 (six) hours as needed for moderate pain. 30 tablet 0   latanoprost (XALATAN) 0.005 % ophthalmic solution Place 1 drop into both eyes at bedtime.     Magnesium 300 MG CAPS Take by mouth as needed.      Omega-3 Fatty Acids (OMEGA-3 FISH OIL) 300 MG CAPS Take 1 capsule by mouth daily.      polyethylene glycol powder (GLYCOLAX/MIRALAX) 17 GM/SCOOP powder Take by mouth.     sildenafil (REVATIO) 20 MG tablet 1-5 tabs po qd prn intercourse 30 tablet 11   No current facility-administered medications for this visit.   No Known Allergies   Review of Systems: Positive for urinary difficulty.  All other systems reviewed and negative except where noted in HPI.   Wt Readings from Last 3 Encounters:  05/29/23 168 lb 3.2 oz (76.3 kg)  04/17/23 165 lb (74.8 kg)  03/20/23 166 lb (75.3 kg)    Physical Exam:  BP 120/68   Pulse 77   Ht 6' (1.829 m)   Wt 168 lb 3.2 oz (76.3 kg)   SpO2 100%   BMI 22.81 kg/m  Constitutional:  Pleasant, generally well appearing male in no acute distress. Psychiatric:  Normal mood and affect. Behavior is normal. EENT: Pupils normal.  Conjunctivae are normal. No scleral icterus. Neck supple.  Cardiovascular: Normal rate, regular rhythm.  Pulmonary/chest: Effort normal and breath sounds normal. No wheezing, rales or rhonchi. Abdominal: Soft, nondistended, nontender. Bowel sounds active throughout. There are no masses palpable. No hepatomegaly. Neurological: Alert and oriented to person place and time.  Keith Cluster, NP  05/29/2023, 10:59 AM  Cc:  Referring Provider McGowen, Maryjean Morn, MD

## 2023-06-02 ENCOUNTER — Encounter: Payer: Self-pay | Admitting: Gastroenterology

## 2023-06-09 ENCOUNTER — Encounter: Payer: Self-pay | Admitting: Family Medicine

## 2023-06-09 MED ORDER — SILDENAFIL CITRATE 20 MG PO TABS: ORAL_TABLET | ORAL | 11 refills | Status: DC

## 2023-06-09 NOTE — Telephone Encounter (Signed)
Okay, sildenafil sent

## 2023-06-12 ENCOUNTER — Ambulatory Visit: Payer: 59 | Admitting: Gastroenterology

## 2023-06-12 ENCOUNTER — Encounter: Payer: Self-pay | Admitting: Gastroenterology

## 2023-06-12 VITALS — BP 105/72 | HR 61 | Temp 97.3°F | Resp 11 | Ht 72.0 in | Wt 168.0 lb

## 2023-06-12 DIAGNOSIS — K2970 Gastritis, unspecified, without bleeding: Secondary | ICD-10-CM | POA: Diagnosis present

## 2023-06-12 DIAGNOSIS — R1310 Dysphagia, unspecified: Secondary | ICD-10-CM

## 2023-06-12 DIAGNOSIS — K219 Gastro-esophageal reflux disease without esophagitis: Secondary | ICD-10-CM | POA: Diagnosis not present

## 2023-06-12 MED ORDER — SODIUM CHLORIDE 0.9 % IV SOLN: 500.0000 mL | INTRAVENOUS | Status: DC

## 2023-06-12 NOTE — Progress Notes (Signed)
Pt's states no medical or surgical changes since previsit or office visit. 

## 2023-06-12 NOTE — Op Note (Signed)
[facility] Patient Name: Keith Mckee Procedure Date: 06/12/2023 9:31 AM MRN: [id number] Endoscopist: Napoleon Form , MD, [id number] Age: 62 Referring MD:  Date of Birth: 03/02/1961 Gender: Male Account #: [id number] Procedure:                Upper GI endoscopy Indications:              Dysphagia, Esophageal reflux symptoms that persist                            despite appropriate therapy, Nausea Medicines:                Monitored Anesthesia Care Procedure:                Pre-Anesthesia Assessment:                           - Prior to the procedure, a History and Physical                            was performed, and patient medications and                            allergies were reviewed. The patient's tolerance of                            previous anesthesia was also reviewed. The risks                            and benefits of the procedure and the sedation                            options and risks were discussed with the patient.                            All questions were answered, and informed consent                            was obtained. Prior Anticoagulants: The patient has                            taken no anticoagulant or antiplatelet agents. ASA                            Grade Assessment: II - A patient with mild systemic                            disease. After reviewing the risks and benefits,                            the patient was deemed in satisfactory condition to                            undergo the procedure.  After obtaining informed consent, the endoscope was                            passed under direct vision. Throughout the                            procedure, the patient's blood pressure, pulse, and                            oxygen saturations were monitored continuously. The                            Olympus Scope [id number] was introduced through the                            mouth,  and advanced to the second part of duodenum.                            The upper GI endoscopy was accomplished without                            difficulty. The patient tolerated the procedure                            well. Scope In: Scope Out: Findings:                 The Z-line was regular and was found 40 cm from the                            incisors.                           No endoscopic abnormality was evident in the                            esophagus to explain the patient's complaint of                            dysphagia. It was decided, however, to proceed with                            dilation of the entire esophagus. The scope was                            withdrawn. Dilation was performed with a Maloney                            dilator with mild resistance at 54 Fr. The dilation                            site was examined following endoscope reinsertion  and showed no change. Biopsies were obtained from                            the proximal and distal esophagus with cold forceps                            for histology of suspected eosinophilic esophagitis.                           Patchy mild inflammation characterized by                            congestion (edema) and erythema was found in the                            entire examined stomach. Biopsies were taken with a                            cold forceps for Helicobacter pylori testing.                           The cardia and gastric fundus were normal on                            retroflexion.                           The examined duodenum was normal. Complications:            No immediate complications. Estimated Blood Loss:     Estimated blood loss was minimal. Impression:               - Z-line regular, 40 cm from the incisors.                           - No endoscopic esophageal abnormality to explain                            patient's dysphagia. Esophagus  dilated. Dilated.                           - Gastritis. Biopsied.                           - Normal examined duodenum.                           - Biopsies were taken with a cold forceps for                            evaluation of eosinophilic esophagitis. Recommendation:           - Patient has a contact number available for                            emergencies. The signs and symptoms of potential  delayed complications were discussed with the                            patient. Return to normal activities tomorrow.                            Written discharge instructions were provided to the                            patient.                           - Mechanical soft diet today, then advance as                            tolerated to resume previous diet.                           - Continue present medications.                           - Await pathology results.                           - Follow an antireflux regimen. Napoleon Form, MD 06/12/2023 9:57:40 AM This report has been signed electronically.

## 2023-06-12 NOTE — Progress Notes (Signed)
Sedate, gd SR, tolerated procedure well, VSS, report to RN 

## 2023-06-12 NOTE — Patient Instructions (Signed)
**  Handout given on Post Dilation diet**  YOU HAD AN ENDOSCOPIC PROCEDURE TODAY AT THE [facility]:   Refer to the procedure report that was given to you for any specific questions about what was found during the examination.  If the procedure report does not answer your questions, please call your gastroenterologist to clarify.  If you requested that your care partner not be given the details of your procedure findings, then the procedure report has been included in a sealed envelope for you to review at your convenience later.  YOU SHOULD EXPECT: Some feelings of bloating in the abdomen. Passage of more gas than usual.  Walking can help get rid of the air that was put into your GI tract during the procedure and reduce the bloating. If you had a lower endoscopy (such as a colonoscopy or flexible sigmoidoscopy) you may notice spotting of blood in your stool or on the toilet paper. If you underwent a bowel prep for your procedure, you may not have a normal bowel movement for a few days.  Please Note:  You might notice some irritation and congestion in your nose or some drainage.  This is from the oxygen used during your procedure.  There is no need for concern and it should clear up in a day or so.  SYMPTOMS TO REPORT IMMEDIATELY:  Following upper endoscopy (EGD)  Vomiting of blood or coffee ground material  New chest pain or pain under the shoulder blades  Painful or persistently difficult swallowing  New shortness of breath  Fever of 100F or higher  Black, tarry-looking stools  For urgent or emergent issues, a gastroenterologist can be reached at any hour by calling [phone]. Do not use MyChart messaging for urgent concerns.    DIET:  We do recommend a small meal at first, but then you may proceed to your regular diet.  Drink plenty of fluids but you should avoid alcoholic beverages for 24 hours.  ACTIVITY:  You should plan to take it easy for the rest of today and you  should NOT DRIVE or use heavy machinery until tomorrow (because of the sedation medicines used during the test).    FOLLOW UP: Our staff will call the number listed on your records the next business day following your procedure.  We will call around 7:15- 8:00 am to check on you and address any questions or concerns that you may have regarding the information given to you following your procedure. If we do not reach you, we will leave a message.     If any biopsies were taken you will be contacted by phone or by letter within the next 1-3 weeks.  Please call us at [phone] if you have not heard about the biopsies in 3 weeks.    SIGNATURES/CONFIDENTIALITY: You and/or your care partner have signed paperwork which will be entered into your electronic medical record.  These signatures attest to the fact that that the information above on your After Visit Summary has been reviewed and is understood.  Full responsibility of the confidentiality of this discharge information lies with you and/or your care-partner.

## 2023-06-12 NOTE — Progress Notes (Signed)
Called to room to assist during endoscopic procedure.  Patient ID and intended procedure confirmed with present staff. Received instructions for my participation in the procedure from the performing physician.  

## 2023-06-12 NOTE — Progress Notes (Signed)
[facility] History and Physical   Primary Care Physician:  Jeoffrey Massed, MD   Reason for Procedure:  Dysphagia, GERD  Plan:    EGD and colonoscopy with possible interventions as needed     HPI: Keith Mckee is a very pleasant 62 y.o. male here for EGD for evaluation and management of dysphagia and persistent GERD symptoms.   The risks and benefits as well as alternatives of endoscopic procedure(s) have been discussed and reviewed. All questions answered. The patient agrees to proceed.    Past Medical History:  Diagnosis Date   Anxiety    BPH (benign prostatic hypertrophy)    Cataract    artifiicial lenses in both eyes   Cervical spondylosis    with radiculopathy not responsive to conservative therapies:  [facility]: ACDF surgery C5-C7 performed 04/05/15   Chronic pain syndrome    cervicalgia, heredetary PN, LBP   COVID-19 virus infection 11/2020   Depression    Difficulty controlling anger    + GAD with OCD tendencies + memory changes---consider neuropsych testing   DISH (diffuse idiopathic skeletal hyperostosis)    Dysphagia    Dysplastic nevus 10/2015   left mid back: Dr. Jorja Loa following   GERD (gastroesophageal reflux disease)    +LPR (ENT 09/2022)   Glaucoma    History of adenomatous polyp of colon    IBS (irritable bowel syndrome)    +constipation   Microscopic colitis 06/2019   colonoscopy + 06/2019   Neuropathic pain    Peripheral neuropathy    Idiopathic, inherited per pt's neurologist, Dr. Antonietta Barcelona. Various labs normal 07/2016 except vitamin B6 was siginificantly elevated.  Allergist eval/allergy testing (including food) ALL NORMAL/NEG 07/2016.   Restless legs syndrome 09/27/2014   Throat irritation    chronic (since cervical spine surgery 2016).  Dr. Lazarus Salines 2017->laryngoscopy normal, CT sinuses normal, barium swallow normal, trial of protonix no help.     Past Surgical History:  Procedure Laterality Date   Ant  cerv decompression/discectomy w/fusion C 5-7  04/05/15   C5-7 ACDF   CATARACT EXTRACTION Bilateral    COLONOSCOPY  06/29/2019   FOR EVAL OF UNEXPLAINED DIARRHEA-->normal, bx c/w microscopic colitis->uceris and budesonide rx'd by Dr. Lavon Paganini 06/2019   COLONOSCOPY W/ POLYPECTOMY  08/2012; 05/23/16   2017, adenomatous polyp--recall 5 yrs.   EYE SURGERY Right 09/06/2020   TRABECULECTOMY Bilateral    for uncontrolled glaucoma    TRANSURETHRAL RESECTION OF PROSTATE  2008   due to enlarged prostate   VASECTOMY  2001    Prior to Admission medications   Medication Sig Start Date End Date Taking? Authorizing Provider  Alpha-Lipoic Acid 300 MG CAPS Take 1 capsule by mouth 2 (two) times daily. 11/22/17  Yes [provider]  busPIRone (BUSPAR) 15 MG tablet 1/2 to 1 tab po qhs 03/20/23  Yes McGowen, Maryjean Morn, MD  clonazePAM (KLONOPIN) 0.5 MG tablet Take 1 tablet (0.5 mg total) by mouth 2 (two) times daily as needed for anxiety. 03/06/23  Yes McGowen, Maryjean Morn, MD  dorzolamide-timolol (COSOPT) 22.3-6.8 MG/ML ophthalmic solution Place 1 drop into the right eye 2 (two) times daily. 12/04/21  Yes [provider]  famotidine (PEPCID) 10 MG tablet Take 10 mg by mouth 2 (two) times daily.   Yes [provider]  gabapentin (NEURONTIN) 600 MG tablet Take 600 mg by mouth. Take 2 tablets in the morning & 2 in the evening 02/21/20  Yes [provider]  latanoprost (XALATAN) 0.005 % ophthalmic  solution Place 1 drop into both eyes at bedtime. 12/20/22  Yes [provider]  Omega-3 Fatty Acids (OMEGA-3 FISH OIL) 300 MG CAPS Take 1 capsule by mouth daily.   Yes [provider]  polyethylene glycol powder (GLYCOLAX/MIRALAX) 17 GM/SCOOP powder Take by mouth.   Yes [provider]  HYDROcodone-acetaminophen (NORCO) 7.5-325 MG tablet Take 1 tablet by mouth every 6 (six) hours as needed for moderate pain. 03/06/23   McGowen, Maryjean Morn, MD  sildenafil (REVATIO) 20 MG  tablet 1-5 tabs po qd prn intercourse 06/09/23   McGowen, Maryjean Morn, MD    Current Outpatient Medications  Medication Sig Dispense Refill   Alpha-Lipoic Acid 300 MG CAPS Take 1 capsule by mouth 2 (two) times daily.     busPIRone (BUSPAR) 15 MG tablet 1/2 to 1 tab po qhs 90 tablet 1   clonazePAM (KLONOPIN) 0.5 MG tablet Take 1 tablet (0.5 mg total) by mouth 2 (two) times daily as needed for anxiety. 120 tablet 0   dorzolamide-timolol (COSOPT) 22.3-6.8 MG/ML ophthalmic solution Place 1 drop into the right eye 2 (two) times daily.     famotidine (PEPCID) 10 MG tablet Take 10 mg by mouth 2 (two) times daily.     gabapentin (NEURONTIN) 600 MG tablet Take 600 mg by mouth. Take 2 tablets in the morning & 2 in the evening     latanoprost (XALATAN) 0.005 % ophthalmic solution Place 1 drop into both eyes at bedtime.     Omega-3 Fatty Acids (OMEGA-3 FISH OIL) 300 MG CAPS Take 1 capsule by mouth daily.     polyethylene glycol powder (GLYCOLAX/MIRALAX) 17 GM/SCOOP powder Take by mouth.     HYDROcodone-acetaminophen (NORCO) 7.5-325 MG tablet Take 1 tablet by mouth every 6 (six) hours as needed for moderate pain. 30 tablet 0   sildenafil (REVATIO) 20 MG tablet 1-5 tabs po qd prn intercourse 30 tablet 11   Current Facility-Administered Medications  Medication Dose Route Frequency Provider Last Rate Last Admin   0.9 %  sodium chloride infusion  500 mL Intravenous Continuous Jordin Vicencio, Eleonore Chiquito, MD        Allergies as of 06/12/2023   (No Known Allergies)    Family History  Problem Relation Age of Onset   Glaucoma Father    Heart disease Father    Neuropathy Father        Peripheral neuropathy   Arthritis Father    Parkinson's disease Father    Schizophrenia Daughter    Colon cancer Neg Hx    Rectal cancer Neg Hx    Stomach cancer Neg Hx    Esophageal cancer Neg Hx     Social History   Socioeconomic History   Marital status: Married    Spouse name: Not on file   Number of children: 2    Years of education: Master D.   Highest education level: Not on file  Occupational History   Occupation: Real Architectural technologist: Production assistant, radio FOR SELF EMPLOYED    Comment: [employer] Estate [employer]   Occupation: handyman  Tobacco Use   Smoking status: Former    Current packs/day: 0.00    Average packs/day: 0.8 packs/day for 15.0 years (11.3 ttl pk-yrs)    Types: Cigarettes    Start date: 06/25/1983    Quit date: 06/24/1998    Years since quitting: 24.9   Smokeless tobacco: Never   Tobacco comments:    2 pack per week  Vaping Use   Vaping  status: Never Used  Substance and Sexual Activity   Alcohol use: Yes    Alcohol/week: 12.0 standard drinks of alcohol    Types: 12 Cans of beer per week    Comment: Patient drinks 12 beers a week   Drug use: No   Sexual activity: Not on file  Other Topics Concern   Not on file  Social History Narrative   Married.   Educ: MBA   Occupation: Naval architect   No tob, occ alc, no drugs.   Exercises regularly.   Patient is right handed.   Enjoys fishing and woodworking.   Social Drivers of Corporate investment banker Strain: Not on file  Food Insecurity: No Food Insecurity (03/03/2023)   Hunger Vital Sign    Worried About Running Out of Food in the Last Year: Never true    Ran Out of Food in the Last Year: Never true  Transportation Needs: No Transportation Needs (03/03/2023)   PRAPARE - Administrator, Civil Service (Medical): No    Lack of Transportation (Non-Medical): No  Physical Activity: Sufficiently Active (03/03/2023)   Exercise Vital Sign    Days of Exercise per Week: 5 days    Minutes of Exercise per Session: 50 min  Stress: Not on file  Social Connections: Not on file  Intimate Partner Violence: Not on file    Review of Systems:  All other review of systems negative except as mentioned in the HPI.  Physical Exam: Vital signs in last 24 hours: BP 108/60   Pulse (!) 51   Temp (!) 97.3 F (36.3 C)    Ht 6' (1.829 m)   Wt 168 lb (76.2 kg)   SpO2 99%   BMI 22.78 kg/m  General:   Alert, NAD Lungs:  Clear .   Heart:  Regular rate and rhythm Abdomen:  Soft, nontender and nondistended. Neuro/Psych:  Alert and cooperative. Normal mood and affect. A and O x 3  Reviewed labs, radiology imaging, old records and pertinent past GI work up  Patient is appropriate for planned procedure(s) and anesthesia in an ambulatory setting   K. Scherry Ran , MD [phone]

## 2023-06-13 ENCOUNTER — Telehealth: Payer: Self-pay | Admitting: *Deleted

## 2023-06-13 NOTE — Telephone Encounter (Signed)
No answer for post procedure call back. Left message. 

## 2023-06-16 LAB — SURGICAL PATHOLOGY

## 2023-07-01 ENCOUNTER — Telehealth: Payer: Self-pay

## 2023-07-01 ENCOUNTER — Other Ambulatory Visit (HOSPITAL_COMMUNITY): Payer: Self-pay

## 2023-07-01 NOTE — Telephone Encounter (Signed)
*  Primary  Pharmacy Patient Advocate Encounter   Received notification from CoverMyMeds that prior authorization for Sildenafil  Citrate (PAH) 20MG  tablets  is required/requested.   Insurance verification completed.   The patient is insured through [employer] .   Per test claim: PA required; PA submitted to above mentioned insurance via CoverMyMeds Key/confirmation #/EOC [facility] Status is pending

## 2023-07-02 NOTE — Telephone Encounter (Signed)
 Pharmacy Patient Advocate Encounter  Received notification from [employer] that Prior Authorization for Sildenafil Citrate has been DENIED.  Full denial letter will be uploaded to the media tab. See denial reason below.   PA #/Case ID/Reference #

## 2023-07-15 ENCOUNTER — Encounter: Payer: Self-pay | Admitting: Gastroenterology

## 2023-07-15 ENCOUNTER — Encounter: Payer: Self-pay | Admitting: Family Medicine

## 2023-07-15 ENCOUNTER — Ambulatory Visit: Payer: 59 | Admitting: Family Medicine

## 2023-07-15 VITALS — BP 101/67 | HR 66 | Ht 73.0 in | Wt 170.0 lb

## 2023-07-15 DIAGNOSIS — Z13220 Encounter for screening for lipoid disorders: Secondary | ICD-10-CM

## 2023-07-15 DIAGNOSIS — Z125 Encounter for screening for malignant neoplasm of prostate: Secondary | ICD-10-CM | POA: Diagnosis not present

## 2023-07-15 DIAGNOSIS — G894 Chronic pain syndrome: Secondary | ICD-10-CM | POA: Diagnosis not present

## 2023-07-15 DIAGNOSIS — F988 Other specified behavioral and emotional disorders with onset usually occurring in childhood and adolescence: Secondary | ICD-10-CM

## 2023-07-15 DIAGNOSIS — F411 Generalized anxiety disorder: Secondary | ICD-10-CM

## 2023-07-15 DIAGNOSIS — R454 Irritability and anger: Secondary | ICD-10-CM

## 2023-07-15 MED ORDER — AMPHETAMINE-DEXTROAMPHET ER 10 MG PO CP24: 10.0000 mg | ORAL_CAPSULE | Freq: Every day | ORAL | 0 refills | Status: DC

## 2023-07-15 MED ORDER — CLONAZEPAM 0.5 MG PO TABS: 0.5000 mg | ORAL_TABLET | Freq: Two times a day (BID) | ORAL | 0 refills | Status: DC | PRN

## 2023-07-15 NOTE — Progress Notes (Signed)
OFFICE VISIT  07/15/2023  CC:  Chief Complaint  Patient presents with   Annual Exam    Pt is not fasting.     Patient is a 63 y.o. male who presents for f/u anxiety.  I last saw him 04/17/23. A/P as of last visit: "#1 chronic nausea.  General lab panel and right upper quadrant ultrasound normal. Suspect dyspepsia, likely stress-induced. Since he is very apprehensive about proton pump inhibitors we decided that he should try over-the-counter famotidine 20 mg twice a day for at least a 2-week trial. At this point he does not plan on going to the gastroenterologist for any further evaluation.   #2 depression, anxiety, difficulty controlling anger, insomnia. Many medication trials.  Unfortunately, he has either not tolerated them or they did not work. He seems to get some benefit out of taking BuSpar one half of a 15 mg tab nightly. He also has Klonopin 0.5 mg to take 1 twice daily as needed.  He is not take this very often. He has started counseling, first visit was yesterday.   #3 decreased libido, erectile dysfunction. Check testosterone level early morning tomorrow fasting."  INTERIM HX: He saw the gastroenterologist on 05/29/2023. EGD was done on 06/12/2023: Some gastritis was seen.  No stricture but dilation was done anyway.  Biopsies were benign.  No H. pylori.  No intraepithelial eosinophils.  Still feels chronic nausea.  10 mg Pepcid twice a day is only marginally helpful.  From a mood and anxiety standpoint he feels like he is improved somewhat. He continues to see a Veterinary surgeon and he takes BuSpar 15 mg tabs, 2 tabs twice a day.  He also takes a clonazepam tab around bedtime. He wonders about ADD. He has a little bit of problem with distractibility, forgets things easily, gets anxious and frustrated when he does this. Feels like he can focus pretty well on something that he enjoys, particularly something like woodworking.  His chronic neuropathic pain is stable.  He does not  need a new prescription for Vicodin today.  His gabapentin is prescribed by different provider. PMP AWARE reviewed today: most recent rx for Vicodin 7.5/325 was filled 03/06/2023, # 30, rx by me. Most recent clonazepam 0.5 mg prescription filled 03/06/2023, #60, prescription by me. No red flags.  ROS as above, plus--> no fevers, no CP, no SOB, no wheezing, no cough, no dizziness, no HAs, no rashes, no melena/hematochezia.  No polyuria or polydipsia.  No myalgias or arthralgias.  No focal weakness, no tremors.  No acute vision or hearing abnormalities.  No dysuria or unusual/new urinary urgency or frequency.  No recent changes in lower legs. No vomiting, constipation, diarrhea, or abdominal pain. no palpitations.     Past Medical History:  Diagnosis Date   Anxiety    BPH (benign prostatic hypertrophy)    Cataract    artifiicial lenses in both eyes   Cervical spondylosis    with radiculopathy not responsive to conservative therapies:  [facility] sc[facility]osis center 02/2015: ACDF surgery C5-C7 performed 04/05/15   Chronic pain syndrome    cervicalgia, heredetary PN, LBP   COVID-19 virus infection 11/2020   Depression    Difficulty controlling anger    + GAD with OCD tendencies + memory changes---consider neuropsych testing   DISH (diffuse idiopathic skeletal hyperostosis)    Dysphagia    Dysplastic nevus 10/2015   left mid back: Dr. Jorja Loa following   GERD (gastroesophageal reflux disease)    +LPR (ENT 09/2022)   Glaucoma  History of adenomatous polyp of colon    IBS (irritable bowel syndrome)    +constipation   Microscopic colitis 06/2019   colonoscopy + 06/2019   Neuropathic pain    Peripheral neuropathy    Idiopathic, inherited per pt's neurologist, Dr. Antonietta Barcelona. Various labs normal 07/2016 except vitamin B6 was siginificantly elevated.  Allergist eval/allergy testing (including food) ALL NORMAL/NEG 07/2016.   Restless legs syndrome 09/27/2014   Throat irritation    chronic (since  cervical spine surgery 2016).  Dr. Lazarus Salines 2017->laryngoscopy normal, CT sinuses normal, barium swallow normal, trial of protonix no help.     Past Surgical History:  Procedure Laterality Date   Ant cerv decompression/discectomy w/fusion C 5-7  04/05/15   C5-7 ACDF   CATARACT EXTRACTION Bilateral    COLONOSCOPY  06/29/2019   FOR EVAL OF UNEXPLAINED DIARRHEA-->normal, bx c/w microscopic colitis->uceris and budesonide rx'd by Dr. Lavon Paganini 06/2019   COLONOSCOPY W/ POLYPECTOMY  08/2012; 05/23/16   2017, adenomatous polyp--recall 5 yrs.   EYE SURGERY Right 09/06/2020   TRABECULECTOMY Bilateral    for uncontrolled glaucoma    TRANSURETHRAL RESECTION OF PROSTATE  2008   due to enlarged prostate   VASECTOMY  2001   Social History   Socioeconomic History   Marital status: Married    Spouse name: Not on file   Number of children: 2   Years of education: Master D.   Highest education level: Not on file  Occupational History   Occupation: Real Architectural technologist: Production assistant, radio FOR SELF EMPLOYED    Comment: [employer] Estate [employer]   Occupation: handyman  Tobacco Use   Smoking status: Former    Current packs/day: 0.00    Average packs/day: 0.8 packs/day for 15.0 years (11.3 ttl pk-yrs)    Types: Cigarettes    Start date: 06/25/1983    Quit date: 06/24/1998    Years since quitting: 25.0   Smokeless tobacco: Never   Tobacco comments:    2 pack per week  Vaping Use   Vaping status: Never Used  Substance and Sexual Activity   Alcohol use: Yes    Alcohol/week: 12.0 standard drinks of alcohol    Types: 12 Cans of beer per week    Comment: Patient drinks 12 beers a week   Drug use: No   Sexual activity: Not on file  Other Topics Concern   Not on file  Social History Narrative   Married.   Educ: MBA   Occupation: Naval architect   No tob, occ alc, no drugs.   Exercises regularly.   Patient is right handed.   Enjoys fishing and woodworking.   Social Drivers of Manufacturing engineer Strain: Not on file  Food Insecurity: No Food Insecurity (03/03/2023)   Hunger Vital Sign    Worried About Running Out of Food in the Last Year: Never true    Ran Out of Food in the Last Year: Never true  Transportation Needs: No Transportation Needs (03/03/2023)   PRAPARE - Administrator, Civil Service (Medical): No    Lack of Transportation (Non-Medical): No  Physical Activity: Sufficiently Active (03/03/2023)   Exercise Vital Sign    Days of Exercise per Week: 5 days    Minutes of Exercise per Session: 50 min  Stress: Not on file  Social Connections: Not on file   Family History  Problem Relation Age of Onset   Glaucoma Father    Heart disease Father  Neuropathy Father        Peripheral neuropathy   Arthritis Father    Parkinson's disease Father    Schizophrenia Daughter    Colon cancer Neg Hx    Rectal cancer Neg Hx    Stomach cancer Neg Hx    Esophageal cancer Neg Hx     Outpatient Medications Prior to Visit  Medication Sig Dispense Refill   Alpha-Lipoic Acid 300 MG CAPS Take 1 capsule by mouth 2 (two) times daily.     busPIRone (BUSPAR) 15 MG tablet 1/2 to 1 tab po qhs 90 tablet 1   clonazePAM (KLONOPIN) 0.5 MG tablet Take 1 tablet (0.5 mg total) by mouth 2 (two) times daily as needed for anxiety. 120 tablet 0   dorzolamide-timolol (COSOPT) 22.3-6.8 MG/ML ophthalmic solution Place 1 drop into the right eye 2 (two) times daily.     famotidine (PEPCID) 10 MG tablet Take 10 mg by mouth 2 (two) times daily.     gabapentin (NEURONTIN) 600 MG tablet Take 600 mg by mouth. Take 2 tablets in the morning & 2 in the evening     HYDROcodone-acetaminophen (NORCO) 7.5-325 MG tablet Take 1 tablet by mouth every 6 (six) hours as needed for moderate pain. 30 tablet 0   latanoprost (XALATAN) 0.005 % ophthalmic solution Place 1 drop into both eyes at bedtime.     Omega-3 Fatty Acids (OMEGA-3 FISH OIL) 300 MG CAPS Take 1 capsule by mouth daily.      polyethylene glycol powder (GLYCOLAX/MIRALAX) 17 GM/SCOOP powder Take by mouth.     sildenafil (REVATIO) 20 MG tablet 1-5 tabs po qd prn intercourse 30 tablet 11   No facility-administered medications prior to visit.    No Known Allergies    PE:    07/15/2023    9:24 AM 06/12/2023   10:14 AM 06/12/2023   10:04 AM  Vitals with BMI  Height 6\' 1"     Weight 170 lbs    BMI 22.43    Systolic 101 105 91  Diastolic 67 72 50  Pulse 66 61 52     Physical Exam  Gen: Alert, well appearing.  Patient is oriented to person, place, time, and situation. AFFECT: pleasant, lucid thought and speech. No further exam today  LABS:  Last CBC Lab Results  Component Value Date   WBC 6.1 03/14/2023   HGB 15.8 03/14/2023   HCT 45.6 03/14/2023   MCV 89.4 03/14/2023   MCH 31.0 03/14/2023   RDW 13.1 03/14/2023   PLT 146 (L) 03/14/2023   Last metabolic panel Lab Results  Component Value Date   GLUCOSE 72 03/14/2023   NA 141 03/14/2023   K 4.3 03/14/2023   CL 105 03/14/2023   CO2 30 03/14/2023   BUN 17 03/14/2023   CREATININE 1.01 03/14/2023   GFRNONAA >60 03/14/2023   CALCIUM 9.6 03/14/2023   PROT 6.7 03/14/2023   ALBUMIN 4.4 03/14/2023   LABGLOB 2.0 03/03/2023   BILITOT 0.7 03/14/2023   ALKPHOS 69 03/14/2023   AST 16 03/14/2023   ALT 14 03/14/2023   ANIONGAP 6 03/14/2023   Last lipids Lab Results  Component Value Date   CHOL 186 10/02/2021   HDL 65 10/02/2021   LDLCALC 88 10/02/2021   TRIG 248 (H) 10/02/2021   CHOLHDL 2.9 10/02/2021   Last thyroid functions Lab Results  Component Value Date   TSH 2.11 03/15/2022    Last vitamin B12 and Folate Lab Results  Component Value Date   VITAMINB12  668 08/06/2016   Lab Results  Component Value Date   PSA 0.93 10/02/2021   PSA 0.99 09/27/2020   PSA 1.2 08/30/2019   Lab Results  Component Value Date   TESTOSTERONE 534 04/18/2023    IMPRESSION AND PLAN:  #1 anxiety, difficulty controlling anger, insomnia.   He  does get some benefit from 30 mg of BuSpar twice a day and clonazepam 0.5 mg nightly. Discussed the general difficulty in getting a clear diagnosis of ADD in the context of his overall chronic psychologic symptoms. Mentioned the possibility of referral to [facility] versus trial of stimulant medication. He chose the latter. We will do trial of Adderall XR 10 mg every morning. Therapeutic expectations and side effect profile of medication discussed today.  Patient's questions answered.  #2 chronic pain syndrome.  History of low back pain plus he has idiopathic peripheral neuropathy.  His neurologist prescribes gabapentin. He takes occasional Vicodin 7.5/325, which I prescribed for him.  He did not need a new prescription today.  #3 chronic nausea. Unknown etiology. History of fairly extensive workup unrevealing. He will get back in touch with his gastroenterologist to see if there is a next step. In the meantime he will continue Pepcid 10 mg twice a day.  #4 preventative health care: Vaccines: all UTD. Labs:  PSA, lipid-->he'll return for lab visit when fasting  CBC and CMET normal 03/14/23. Prostate ca screening: PSA today Colon ca screening: recall 2031  An After Visit Summary was printed and given to the patient.  FOLLOW UP: No follow-ups on file.  Signed:  Santiago Bumpers, MD           07/15/2023

## 2023-07-16 ENCOUNTER — Other Ambulatory Visit: Payer: Self-pay | Admitting: Family Medicine

## 2023-07-28 ENCOUNTER — Other Ambulatory Visit: Payer: 59

## 2023-07-28 DIAGNOSIS — Z125 Encounter for screening for malignant neoplasm of prostate: Secondary | ICD-10-CM

## 2023-07-28 DIAGNOSIS — Z13220 Encounter for screening for lipoid disorders: Secondary | ICD-10-CM

## 2023-07-29 ENCOUNTER — Ambulatory Visit: Payer: 59 | Admitting: Family Medicine

## 2023-07-29 LAB — PSA: PSA: 1.21 ng/mL (ref <=4.00)

## 2023-07-29 LAB — LIPID PANEL
Cholesterol: 154 mg/dL (ref <200)
HDL: 67 mg/dL (ref >=40)
LDL Cholesterol (Calc): 62 mg/dL
Non-HDL Cholesterol (Calc): 87 mg/dL (ref <130)
Total CHOL/HDL Ratio: 2.3 (calc) (ref <5.0)
Triglycerides: 186 mg/dL — ABNORMAL HIGH (ref <150)

## 2023-07-30 ENCOUNTER — Encounter: Payer: Self-pay | Admitting: Family Medicine

## 2023-09-10 ENCOUNTER — Encounter: Payer: Self-pay | Admitting: Urgent Care

## 2023-09-10 ENCOUNTER — Ambulatory Visit (HOSPITAL_BASED_OUTPATIENT_CLINIC_OR_DEPARTMENT_OTHER)
Admission: RE | Admit: 2023-09-10 | Discharge: 2023-09-10 | Disposition: A | Discharge status: Home / Self Care | Source: Ambulatory Visit | Attending: Urgent Care | Admitting: Urgent Care

## 2023-09-10 ENCOUNTER — Ambulatory Visit (INDEPENDENT_AMBULATORY_CARE_PROVIDER_SITE_OTHER): Admitting: Urgent Care

## 2023-09-10 VITALS — BP 128/85 | HR 55 | Wt 169.8 lb

## 2023-09-10 DIAGNOSIS — M5442 Lumbago with sciatica, left side: Secondary | ICD-10-CM | POA: Insufficient documentation

## 2023-09-10 NOTE — Patient Instructions (Addendum)
 [facility] Address: [address], [place], [place] [address] Phone: [phone]   Please go to obtain xrays today. I have concern about a herniated disc on your left.  Continue your pain medication as ordered for your arm. We will be in contact with the results and make further recommendations based upon xray report.

## 2023-09-10 NOTE — Progress Notes (Signed)
 Established Patient Office Visit  Subjective:  Patient ID: Keith Mckee, male    DOB: 1960/07/24  Age: 63 y.o. MRN: [id number]  Chief Complaint  Patient presents with   Back Pain    Back pain that has been radiating to his groin for about a week now. He states its not bothering him today but last night it did. The pain is on the left side of his back and goes to the left testicle.    Back Pain    Discussed the use of AI scribe software for clinical note transcription with the patient, who gave verbal consent to proceed.  History of Present Illness   Keith Mckee "Trey Paula" is a 63 year old male with chronic back pain who presents with acute exacerbation of back pain radiating to the groin and testicle on the L.  He developed back pain two weeks ago, which radiates into his groin and testicle. He has a history of intermittent back pain throughout his life. The pain is primarily located in the lower back, more on the left side, away from the spine, and radiates into the left testicle. He describes the pain as a 'dull ache' rather than sharp or stabbing.  The pain does not bother him when he is up and moving or walking, but sitting and lying on his back in bed exacerbate the discomfort. The pain is particularly severe when waking up in the morning after lying on his back.  He has been seeing a chiropractor, but the treatments have not alleviated his symptoms. He has not had any x-rays done yet.Marland Kitchen  He is currently taking over-the-counter medications, including 600 mg of Advil and 1000 mg of Tylenol, which he alternates. He also has a prescription for oxycodone, which he has not yet taken. He recently underwent distal bicep surgery (yesterday) and was given oxycodone post-operatively, which, along with anesthesia, may have contributed to a temporary relief of his back pain. He is in no pain today.  No new changes in urination, although he has a history of an enlarged prostate causing  occasional difficulty urinating. No changes in bowel movements, no fever, no difficulty walking, and no swelling or redness in the testicular area. He has not experienced any radiation of pain into the thigh or buttocks, and there is no tenderness upon self-examination of the testicles.       Patient Active Problem List   Diagnosis Date Noted   Dysphagia 09/26/2022   Hypertropia of right eye 06/12/2021   Cystoid macular edema of right eye 12/05/2020   Irregular astigmatism of right eye 01/26/2019   S/P eye surgery 01/26/2019   Monocular diplopia of right eye 01/14/2019   Pseudophakia of both eyes 08/04/2018   Age-related nuclear cataract of left eye 02/17/2018   Pseudophakia of right eye 02/17/2018   Glaucoma 12/24/2017   Health maintenance examination 12/12/2015   Idiopathic peripheral neuropathy 11/28/2015   Low back pain 11/28/2015   Lumbar facet arthropathy 11/28/2015   Neck pain 11/28/2015   Deviated nasal septum 10/24/2015   Gastroesophageal reflux disease 10/24/2015   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 04/05/2015   Cervical spinal stenosis 04/05/2015   Cervical spondylosis with radiculopathy 04/05/2015   Spondylosis 03/24/2015   Restless legs syndrome 09/27/2014   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   Primary open-angle glaucoma 12/21/2013   Abnormal cardiovascular stress test 10/21/2013   Dyspnea 06/04/2013   Disturbance of skin sensation 06/11/2012   Polyneuropathy in other diseases classified elsewhere (  HCC) 06/11/2012   Cervical spondylosis without myelopathy 06/11/2012   Past Medical History:  Diagnosis Date   Anxiety    BPH (benign prostatic hypertrophy)    Cataract    artifiicial lenses in both eyes   Cervical spondylosis    with radiculopathy not responsive to conservative therapies:  [facility]: ACDF surgery C5-C7 performed 04/05/15   Chronic pain syndrome    cervicalgia, heredetary PN, LBP   COVID-19  virus infection 11/2020   Depression    Difficulty controlling anger    + GAD with OCD tendencies + memory changes---consider neuropsych testing   DISH (diffuse idiopathic skeletal hyperostosis)    Dysphagia    Dysplastic nevus 10/2015   left mid back: Dr. Jorja Loa following   GERD (gastroesophageal reflux disease)    +LPR (ENT 09/2022)   Glaucoma    History of adenomatous polyp of colon    IBS (irritable bowel syndrome)    +constipation   Microscopic colitis 06/2019   colonoscopy + 06/2019   Neuropathic pain    Peripheral neuropathy    Idiopathic, inherited per pt's neurologist, Dr. Antonietta Barcelona. Various labs normal 07/2016 except vitamin B6 was siginificantly elevated.  Allergist eval/allergy testing (including food) ALL NORMAL/NEG 07/2016.   Restless legs syndrome 09/27/2014   Throat irritation    chronic (since cervical spine surgery 2016).  Dr. Lazarus Salines 2017->laryngoscopy normal, CT sinuses normal, barium swallow normal, trial of protonix no help.    Past Surgical History:  Procedure Laterality Date   Ant cerv decompression/discectomy w/fusion C 5-7  04/05/15   C5-7 ACDF   CATARACT EXTRACTION Bilateral    COLONOSCOPY  06/29/2019   FOR EVAL OF UNEXPLAINED DIARRHEA-->normal, bx c/w microscopic colitis->uceris and budesonide rx'd by Dr. Lavon Paganini 06/2019   COLONOSCOPY W/ POLYPECTOMY  08/2012; 05/23/16   2017, adenomatous polyp--recall 5 yrs.   EYE SURGERY Right 09/06/2020   TRABECULECTOMY Bilateral    for uncontrolled glaucoma    TRANSURETHRAL RESECTION OF PROSTATE  2008   due to enlarged prostate   VASECTOMY  2001   Social History   Tobacco Use   Smoking status: Former    Current packs/day: 0.00    Average packs/day: 0.8 packs/day for 15.0 years (11.3 ttl pk-yrs)    Types: Cigarettes    Start date: 06/25/1983    Quit date: 06/24/1998    Years since quitting: 25.2   Smokeless tobacco: Never   Tobacco comments:    2 pack per week  Vaping Use   Vaping status: Never Used  Substance  Use Topics   Alcohol use: Yes    Alcohol/week: 12.0 standard drinks of alcohol    Types: 12 Cans of beer per week    Comment: Patient drinks 12 beers a week   Drug use: No      ROS: as noted in HPI  Objective:     BP 128/85   Pulse (!) 55   Wt 169 lb 12.8 oz (77 kg)   SpO2 98%   BMI 22.40 kg/m  BP Readings from Last 3 Encounters:  09/10/23 128/85  07/15/23 101/67  06/12/23 105/72   Wt Readings from Last 3 Encounters:  09/10/23 169 lb 12.8 oz (77 kg)  07/15/23 170 lb (77.1 kg)  06/12/23 168 lb (76.2 kg)      Physical Exam Vitals and nursing note reviewed. Exam conducted with a chaperone present.  Constitutional:      General: He is not in acute distress.    Appearance: Normal appearance.  He is normal weight. He is not ill-appearing, toxic-appearing or diaphoretic.  Genitourinary:    Penis: Circumcised.      Testes:        Left: Tenderness (mild) present. Mass, swelling, testicular hydrocele or varicocele not present. Left testis is descended. Cremasteric reflex is present.      Epididymis:     Left: Normal. Not inflamed or enlarged. No mass or tenderness.  Musculoskeletal:     Lumbar back: Tenderness present. No swelling, spasms or bony tenderness. Negative right straight leg raise test and negative left straight leg raise test.       Back:     Comments: R arm is in sling Normal rectal exam - normal sphincter tone Pt able to stand on heels and toes normally Limited exam due to patient in sling and surgery to R bicep yesterday  Neurological:     Mental Status: He is alert.      No results found for any visits on 09/10/23.  Last CBC Lab Results  Component Value Date   WBC 6.1 03/14/2023   HGB 15.8 03/14/2023   HCT 45.6 03/14/2023   MCV 89.4 03/14/2023   MCH 31.0 03/14/2023   RDW 13.1 03/14/2023   PLT 146 (L) 03/14/2023   Last metabolic panel Lab Results  Component Value Date   GLUCOSE 72 03/14/2023   NA 141 03/14/2023   K 4.3 03/14/2023   CL  105 03/14/2023   CO2 30 03/14/2023   BUN 17 03/14/2023   CREATININE 1.01 03/14/2023   GFRNONAA >60 03/14/2023   CALCIUM 9.6 03/14/2023   PROT 6.7 03/14/2023   ALBUMIN 4.4 03/14/2023   LABGLOB 2.0 03/03/2023   BILITOT 0.7 03/14/2023   ALKPHOS 69 03/14/2023   AST 16 03/14/2023   ALT 14 03/14/2023   ANIONGAP 6 03/14/2023      The 10-year ASCVD risk score (Arnett DK, et al., 2019) is: 6.1%  Assessment & Plan:  Acute right-sided low back pain with left-sided sciatica -     DG Lumbar Spine Complete; Future  Assessment and Plan    Low back pain with radiation to groin Two-week history of low back pain radiating to left groin and testicle. Differential includes herniated disc at L5-S1. Cauda equina syndrome ruled out. X-rays needed to evaluate condition. - Order lumbar spine x-ray with flexion-extension views. - Advise to go to outpatient imaging center for x-ray without appointment. - Post x-ray results and recommendations to MyChart. - Discuss treatment options based on x-ray results, including physical therapy for herniated disc. - continue meds prescribed post operatively for arm  Osteoarthritis History of neck surgery due to degeneration and fusion. Possible degenerative changes in lower back contributing to symptoms.       If you develop red flag signs or symptoms, head to the ER - urinary or bowel incontinence, radiation down legs, or numbness in groin    No follow-ups on file.   Maretta Bees, PA

## 2023-09-11 ENCOUNTER — Encounter: Payer: Self-pay | Admitting: Urgent Care

## 2023-09-11 DIAGNOSIS — M5442 Lumbago with sciatica, left side: Secondary | ICD-10-CM

## 2023-09-11 MED ORDER — METAXALONE 800 MG PO TABS: 800.0000 mg | ORAL_TABLET | Freq: Three times a day (TID) | ORAL | 0 refills | Status: DC | PRN

## 2023-09-11 NOTE — Telephone Encounter (Signed)
 Patient is returning a call concerning his xray results and to have a better understanding . Called the cal line and everyone was busy with patients . Please give patient a call back concerning this issue . [phone] [place] cell phone number to call on

## 2023-10-14 NOTE — Therapy (Unsigned)
 OUTPATIENT PHYSICAL THERAPY THORACOLUMBAR EVALUATION   Patient Name: Keith Mckee MRN: [id number] DOB:1961/02/12, 63 y.o., male Today's Date: 10/15/2023  END OF SESSION:  PT End of Session - 10/15/23 0927     Visit Number 1    Number of Visits 16    Date for PT Re-Evaluation 01/07/24    PT Start Time 0930    PT Stop Time 1010    PT Time Calculation (min) 40 min    Activity Tolerance No increased pain;Patient tolerated treatment well    Behavior During Therapy [facility] for tasks assessed/performed             Past Medical History:  Diagnosis Date   Anxiety    BPH (benign prostatic hypertrophy)    Cataract    artifiicial lenses in both eyes   Cervical spondylosis    with radiculopathy not responsive to conservative therapies:  [facility]: ACDF surgery C5-C7 performed 04/05/15   Chronic pain syndrome    cervicalgia, heredetary PN, LBP   COVID-19 virus infection 11/2020   Depression    Difficulty controlling anger    + GAD with OCD tendencies + memory changes---consider neuropsych testing   DISH (diffuse idiopathic skeletal hyperostosis)    Dysphagia    Dysplastic nevus 10/2015   left mid back: Dr. Steen Eden following   GERD (gastroesophageal reflux disease)    +LPR (ENT 09/2022)   Glaucoma    History of adenomatous polyp of colon    IBS (irritable bowel syndrome)    +constipation   Microscopic colitis 06/2019   colonoscopy + 06/2019   Neuropathic pain    Peripheral neuropathy    Idiopathic, inherited per pt's neurologist, Dr. Tonuzi. Various labs normal 07/2016 except vitamin B6 was siginificantly elevated.  Allergist eval/allergy testing (including food) ALL NORMAL/NEG 07/2016.   Restless legs syndrome 09/27/2014   Throat irritation    chronic (since cervical spine surgery 2016).  Dr. Archer Kobs 2017->laryngoscopy normal, CT sinuses normal, barium swallow normal, trial of protonix no help.    Past Surgical History:  Procedure Laterality Date   Ant  cerv decompression/discectomy w/fusion C 5-7  04/05/15   C5-7 ACDF   CATARACT EXTRACTION Bilateral    COLONOSCOPY  06/29/2019   FOR EVAL OF UNEXPLAINED DIARRHEA-->normal, bx c/w microscopic colitis->uceris  and budesonide  rx'd by Dr. Leonia Raman 06/2019   COLONOSCOPY W/ POLYPECTOMY  08/2012; 05/23/16   2017, adenomatous polyp--recall 5 yrs.   EYE SURGERY Right 09/06/2020   TRABECULECTOMY Bilateral    for uncontrolled glaucoma    TRANSURETHRAL RESECTION OF PROSTATE  2008   due to enlarged prostate   VASECTOMY  2001   Patient Active Problem List   Diagnosis Date Noted   Dysphagia 09/26/2022   Hypertropia of right eye 06/12/2021   Cystoid macular edema of right eye 12/05/2020   Irregular astigmatism of right eye 01/26/2019   S/P eye surgery 01/26/2019   Monocular diplopia of right eye 01/14/2019   Pseudophakia of both eyes 08/04/2018   Age-related nuclear cataract of left eye 02/17/2018   Pseudophakia of right eye 02/17/2018   Glaucoma 12/24/2017   Health maintenance examination 12/12/2015   Idiopathic peripheral neuropathy 11/28/2015   Low back pain 11/28/2015   Lumbar facet arthropathy 11/28/2015   Neck pain 11/28/2015   Deviated nasal septum 10/24/2015   Gastroesophageal reflux disease 10/24/2015   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 04/05/2015   Cervical spinal stenosis 04/05/2015   Cervical spondylosis with radiculopathy 04/05/2015   Spondylosis 03/24/2015  Restless legs syndrome 09/27/2014   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   Primary open-angle glaucoma 12/21/2013   Abnormal cardiovascular stress test 10/21/2013   Dyspnea 06/04/2013   Disturbance of skin sensation 06/11/2012   Polyneuropathy in other diseases classified elsewhere (HCC) 06/11/2012   Cervical spondylosis without myelopathy 06/11/2012    PCP: Mandy Second, PA  REFERRING PROVIDER: Mandy Second, PA  REFERRING DIAG: [phone] (ICD-10-CM) - Acute right-sided low back pain  with left-sided sciatica  Rationale for Evaluation and Treatment: Rehabilitation  THERAPY DIAG:  Other low back pain  Muscle weakness (generalized)  ONSET DATE: a while ago  SUBJECTIVE:                                                                                                                                                                                           SUBJECTIVE STATEMENT: States he has had this pain for a while. States [facility] to [facility] and his treatments didn't help States that pain was radiate to his left testicle.  Patient also has a chronic low back pain that radiates into his right leg causing his right leg to go numb after standing for any amount of time.  Reports the mornings are worse and he tosses and turns and cannot sleep.  Reports he historically was working out 5 days a week with hiking and strength training as well as some yoga exercises but has had to put some of this on hold with his recent biceps repair surgery.   PERTINENT HISTORY  Recent distal biceps repair  09/09/23, neuropathy, IBS, DISH, GERD, C5-7 fusion 04/08/15  PAIN:  Are you having pain? Yes: NPRS scale: 4/10 Pain location: left side and left testicle Pain description: constant ache  Aggravating factors: sitting, AM Relieving factors: standing and walking   PRECAUTIONS: None  RED FLAGS: None   WEIGHT BEARING RESTRICTIONS: No  FALLS:  Has patient fallen in last 6 months? 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  DIAGNOSTIC FINDINGS:  09/10/23 lumbar xray FINDINGS: There is no evidence of lumbar spine fracture. Alignment is normal. Moderate degenerative disc disease is noted at L1-2, L2-3 and L5-S1. Mild degenerative disc disease is noted at L3-4 and L4-5 with anterior osteophyte  formation.   IMPRESSION: Multilevel degenerative changes as noted above. No acute abnormality seen.    PATIENT SURVEYS:  Patient-specific activity functional scoring scheme (Point to one number):  "0" represents "unable to  perform." "10" represents "able to perform at prior level. 0 1 2 3 4 5 6 7 8 9  10 (Date and Score) Activity Initial  Activity Eval     Standing a couple minutes   7    Sleeping in AM   5    sitting 7    Additional Additional Total score = sum of the activity scores/number of activities Minimum detectable change (90%CI) for average score = 2 points Minimum detectable change (90%CI) for single activity score = 3 points PSFS developed by: [place] Spitz., & Binkley, J. (1995). Assessing disability and change on individual  patients: a report of a patient specific measure. Physiotherapy [place], [phone], [phone]. Reproduced with the permission of the authors  Score: 19/30=6.33   COGNITION: Overall cognitive status: Within functional limits for tasks assessed     SENSATION: Not tested    POSTURE: forward head and decreased lumbar lordosis  PALPATION: NA  LUMBAR ROM:   AROM eval  Flexion 75% limited  Extension 90% limited (felt good)  Right lateral flexion 75% limited * pain in left leg  Left lateral flexion 75% limited * pain in left leg  Right rotation   Left rotation    (Blank rows = not tested)    LE Measurements - will assess in future sessions Lower Extremity Right EVAL Left EVAL   A/PROM MMT A/PROM MMT  Hip Flexion      Hip Extension      Hip Abduction      Hip Adduction      Hip Internal rotation      Hip External rotation      Knee Flexion      Knee Extension      Ankle Dorsiflexion      Ankle Plantarflexion      Ankle Inversion      Ankle Eversion       (Blank rows = not tested) * pain   LUMBAR SPECIAL TESTS:  Slump neg B    TREATMENT DATE:                                                                                                                                10/15/2023  Therapeutic Exercise:  Aerobic: Supine: self traction with bed sheets 5 minutes Prone:  Seated:  Standing: Neuromuscular Re-education: Manual Therapy:lumbar traction with ball 8 minutes Therapeutic Activity: Self Care: Trigger Point Dry Needling:  Modalities: thermotherapy to lumbar spine during supine interventions     PATIENT EDUCATION:  Education details: on current presentation, on HEP, on clinical outcomes score and POC, on decompression and posture and how this relates to intra-abdominal pressure and pain Person educated: Patient Education method: Programmer, multimedia, Demonstration, and Handouts Education comprehension: verbalized understanding   HOME EXERCISE PROGRAM: No medbridge Self traction with bed sheets  ASSESSMENT:  CLINICAL IMPRESSION: Patient presents to physical therapy with complaints of chronic low back pain that radiates into the  left leg into left scrotum and also into right leg.  Pain is worse in the morning as well with sitting and standing.  Movement and walking tends to help with his pain.  Patient presents with limitations in range of motion and posture that is likely contributing to current presentation.  Patient greatly benefit from skilled PT to address physical impairments to return patient to optimal function.  OBJECTIVE IMPAIRMENTS: decreased activity tolerance, decreased ROM, decreased strength, postural dysfunction, and pain.   ACTIVITY LIMITATIONS: sitting and standing  PARTICIPATION LIMITATIONS: community activity  PERSONAL FACTORS: Fitness and 1-2 comorbidities: cervical fusion, right biceps repair  are also affecting patient's functional outcome.   REHAB POTENTIAL: Good  CLINICAL DECISION MAKING: Stable/uncomplicated  EVALUATION COMPLEXITY: Low   GOALS: Goals reviewed with patient? yes  SHORT TERM GOALS: Target date: 11/26/2023   Patient will be independent  in self management strategies to improve quality of life and functional outcomes. Baseline: New Program Goal status: INITIAL  2.  Patient will report at least 50% improvement in overall symptoms and/or function to demonstrate improved functional mobility Baseline: 0% better Goal status: INITIAL  3.  Patient will be able to stand for 10 minutes at a time without right leg going numb Baseline: Unable Goal status: INITIAL      LONG TERM GOALS: Target date: 01/07/2024    Patient will report at least 75% improvement in overall symptoms and/or function to demonstrate improved functional mobility Baseline: 0% better Goal status: INITIAL  2.  Patient will score at least 2 points higher on PSFS average to demonstrate change in overall function. Baseline: see above Goal status: INITIAL  3.  Patient will be able to report sleeping in the morning without waking up due to pain to improve sleep quality Baseline: Unable painful Goal status: INITIAL   PLAN:  PT FREQUENCY: 1-2x/week  PT DURATION: 12 weeks  PLANNED INTERVENTIONS: 97110-Therapeutic exercises, 97530- Therapeutic activity, 97112- Neuromuscular re-education, 97535- Self Care, 16109- Manual therapy, [phone]- Gait training, [phone]- Orthotic Fit/training, [phone]- Canalith repositioning, [id number]- Aquatic Therapy, [id number]- Electrical stimulation (unattended), [phone]- Ionotophoresis 4mg /ml Dexamethasone, Patient/Family education, Balance training, Stair training, Taping, Dry Needling, Joint mobilization, Joint manipulation, Spinal manipulation, Spinal mobilization, Cryotherapy, and Moist heat   PLAN FOR NEXT SESSION: traction, self traction techniques, decompressive exercises, breathing exercises   11:08 AM, 10/15/23 Tabitha Ewings, DPT [facility]

## 2023-10-15 ENCOUNTER — Ambulatory Visit (INDEPENDENT_AMBULATORY_CARE_PROVIDER_SITE_OTHER): Admitting: Physical Therapy

## 2023-10-15 ENCOUNTER — Encounter: Payer: Self-pay | Admitting: Physical Therapy

## 2023-10-15 DIAGNOSIS — M5459 Other low back pain: Secondary | ICD-10-CM

## 2023-10-15 DIAGNOSIS — M6281 Muscle weakness (generalized): Secondary | ICD-10-CM

## 2023-10-17 ENCOUNTER — Other Ambulatory Visit: Payer: Self-pay | Admitting: Family Medicine

## 2023-10-28 ENCOUNTER — Encounter: Payer: Self-pay | Admitting: Physical Therapy

## 2023-10-28 ENCOUNTER — Ambulatory Visit (INDEPENDENT_AMBULATORY_CARE_PROVIDER_SITE_OTHER): Admitting: Physical Therapy

## 2023-10-28 DIAGNOSIS — M5459 Other low back pain: Secondary | ICD-10-CM | POA: Diagnosis not present

## 2023-10-28 DIAGNOSIS — M6281 Muscle weakness (generalized): Secondary | ICD-10-CM

## 2023-10-28 NOTE — Therapy (Signed)
 OUTPATIENT PHYSICAL THERAPY THORACOLUMBAR TREATMENT   Patient Name: Keith Mckee MRN: [id number] DOB:11/23/1960, 63 y.o., male Today's Date: 10/28/2023  END OF SESSION:  PT End of Session - 10/28/23 1342     Visit Number 2    Number of Visits 16    Date for PT Re-Evaluation 01/07/24    PT Start Time 1345    PT Stop Time 1425    PT Time Calculation (min) 40 min    Activity Tolerance No increased pain;Patient tolerated treatment well    Behavior During Therapy [facility] for tasks assessed/performed             Past Medical History:  Diagnosis Date   Anxiety    BPH (benign prostatic hypertrophy)    Cataract    artifiicial lenses in both eyes   Cervical spondylosis    with radiculopathy not responsive to conservative therapies:  [facility] 02/2015: ACDF surgery C5-C7 performed 04/05/15   Chronic pain syndrome    cervicalgia, heredetary PN, LBP   COVID-19 virus infection 11/2020   Depression    Difficulty controlling anger    + GAD with OCD tendencies + memory changes---consider neuropsych testing   DISH (diffuse idiopathic skeletal hyperostosis)    Dysphagia    Dysplastic nevus 10/2015   left mid back: Dr. Steen Eden following   GERD (gastroesophageal reflux disease)    +LPR (ENT 09/2022)   Glaucoma    History of adenomatous polyp of colon    IBS (irritable bowel syndrome)    +constipation   Microscopic colitis 06/2019   colonoscopy + 06/2019   Neuropathic pain    Peripheral neuropathy    Idiopathic, inherited per pt's neurologist, Dr. Tonuzi. Various labs normal 07/2016 except vitamin B6 was siginificantly elevated.  Allergist eval/allergy testing (including food) ALL NORMAL/NEG 07/2016.   Restless legs syndrome 09/27/2014   Throat irritation    chronic (since cervical spine surgery 2016).  Dr. Archer Kobs 2017->laryngoscopy normal, CT sinuses normal, barium swallow normal, trial of protonix no help.    Past Surgical History:  Procedure Laterality Date   Ant  cerv decompression/discectomy w/fusion C 5-7  04/05/15   C5-7 ACDF   CATARACT EXTRACTION Bilateral    COLONOSCOPY  06/29/2019   FOR EVAL OF UNEXPLAINED DIARRHEA-->normal, bx c/w microscopic colitis->uceris  and budesonide  rx'd by Dr. Leonia Raman 06/2019   COLONOSCOPY W/ POLYPECTOMY  08/2012; 05/23/16   2017, adenomatous polyp--recall 5 yrs.   EYE SURGERY Right 09/06/2020   TRABECULECTOMY Bilateral    for uncontrolled glaucoma    TRANSURETHRAL RESECTION OF PROSTATE  2008   due to enlarged prostate   VASECTOMY  2001   Patient Active Problem List   Diagnosis Date Noted   Dysphagia 09/26/2022   Hypertropia of right eye 06/12/2021   Cystoid macular edema of right eye 12/05/2020   Irregular astigmatism of right eye 01/26/2019   S/P eye surgery 01/26/2019   Monocular diplopia of right eye 01/14/2019   Pseudophakia of both eyes 08/04/2018   Age-related nuclear cataract of left eye 02/17/2018   Pseudophakia of right eye 02/17/2018   Glaucoma 12/24/2017   Health maintenance examination 12/12/2015   Idiopathic peripheral neuropathy 11/28/2015   Low back pain 11/28/2015   Lumbar facet arthropathy 11/28/2015   Neck pain 11/28/2015   Deviated nasal septum 10/24/2015   Gastroesophageal reflux disease 10/24/2015   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 04/05/2015   Cervical spinal stenosis 04/05/2015   Cervical spondylosis with radiculopathy 04/05/2015   Spondylosis 03/24/2015  Restless legs syndrome 09/27/2014   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   Primary open-angle glaucoma 12/21/2013   Abnormal cardiovascular stress test 10/21/2013   Dyspnea 06/04/2013   Disturbance of skin sensation 06/11/2012   Polyneuropathy in other diseases classified elsewhere (HCC) 06/11/2012   Cervical spondylosis without myelopathy 06/11/2012    PCP: Mandy Second, PA  REFERRING PROVIDER: Mandy Second, PA  REFERRING DIAG: [phone] (ICD-10-CM) - Acute right-sided low back pain  with left-sided sciatica  Rationale for Evaluation and Treatment: Rehabilitation  THERAPY DIAG:  Other low back pain  Muscle weakness (generalized)  ONSET DATE: a while ago  SUBJECTIVE:                                                                                                                                                                                           SUBJECTIVE STATEMENT: 10/28/2023 States he was doing well for the last couple of days but yesterday he bent a certain way and had a lower pop. States that he feels better after the stretch.    Eval: States he has had this pain for a while. States [facility] to [facility] and his treatments didn't help States that pain was radiate to his left testicle.  Patient also has a chronic low back pain that radiates into his right leg causing his right leg to go numb after standing for any amount of time.  Reports the mornings are worse and he tosses and turns and cannot sleep.  Reports he historically was working out 5 days a week with hiking and strength training as well as some yoga exercises but has had to put some of this on hold with his recent biceps repair surgery.   PERTINENT HISTORY  Recent distal biceps repair  09/09/23, neuropathy, IBS, DISH, GERD, C5-7 fusion 04/08/15  PAIN:  Are you having pain? Yes: NPRS scale: 4/10 Pain location: left side and left testicle Pain description: constant ache  Aggravating factors: sitting, AM Relieving factors: standing and walking   PRECAUTIONS: None  RED FLAGS: None   WEIGHT BEARING RESTRICTIONS: No  FALLS:  Has patient fallen in last 6 months? 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  DIAGNOSTIC FINDINGS:  09/10/23 lumbar xray FINDINGS: There is no evidence of lumbar spine  fracture. Alignment is normal. Moderate degenerative disc disease is noted at L1-2, L2-3 and L5-S1. Mild degenerative disc disease is noted at L3-4 and L4-5 with  anterior osteophyte formation.   IMPRESSION: Multilevel degenerative changes as noted above. No acute abnormality seen.    PATIENT SURVEYS:  Patient-specific activity functional scoring scheme (Point to one number):  "0" represents "unable to perform." "10" represents "able to perform at prior level. 0 1 2 3 4 5 6 7 8 9  10 (Date and Score) Activity Initial  Activity Eval     Standing a couple minutes   7    Sleeping in AM   5    sitting 7    Additional Additional Total score = sum of the activity scores/number of activities Minimum detectable change (90%CI) for average score = 2 points Minimum detectable change (90%CI) for single activity score = 3 points PSFS developed by: [place] Spitz., & Binkley, J. (1995). Assessing disability and change on individual  patients: a report of a patient specific measure. Physiotherapy [place], [phone], [phone]. Reproduced with the permission of the authors  Score: 19/30=6.33   COGNITION: Overall cognitive status: Within functional limits for tasks assessed     SENSATION: Not tested    POSTURE: forward head and decreased lumbar lordosis  PALPATION: NA  LUMBAR ROM:   AROM eval  Flexion 75% limited  Extension 90% limited (felt good)  Right lateral flexion 75% limited * pain in left leg  Left lateral flexion 75% limited * pain in left leg  Right rotation   Left rotation    (Blank rows = not tested)    LE Measurements - will assess in future sessions Lower Extremity Right EVAL Left EVAL   A/PROM MMT A/PROM MMT  Hip Flexion      Hip Extension      Hip Abduction      Hip Adduction      Hip Internal rotation 10  30   Hip External rotation 70  40   Knee Flexion      Knee Extension      Ankle Dorsiflexion      Ankle Plantarflexion      Ankle  Inversion      Ankle Eversion       (Blank rows = not tested) * pain   LUMBAR SPECIAL TESTS:  Slump neg B Ely's test    TREATMENT DATE:                                                                                                                               10/28/2023  Therapeutic Exercise:  Aerobic: Supine: hip IR 3 minutes alternating Prone: lying 3 minutes, hamstring curls 2x10 5" hlds B  Seated:  Standing: Neuromuscular Re-education:pelvic tilts 10 minutes supine- then quad with ball for trunk/right U support 8 minutes, hip hinges education, posture and sitting on sit bones 8 minutes,  Manual Therapy:  Therapeutic Activity: Self Care: Trigger Point Dry Needling:  Modalities:      PATIENT EDUCATION:  Education details: on HEP Person educated: Patient Education method: Programmer, multimedia, Demonstration,  and Handouts Education comprehension: verbalized understanding   HOME EXERCISE PROGRAM: [id number] Self traction with bed sheets  ASSESSMENT:  CLINICAL IMPRESSION: Tolerated session well.  No increase in pain, focus was on posture as well as pelvic mobility.  Very difficult for patient to perform anterior pelvic tilts.  Trialed in different positions including quadruped with ball support so no stress was placed on right upper extremity.  Checked in with patient to ensure that right upper extremity was not in any pain or discomfort during or after exercises.  Added new exercises to home program.  Will continue with current plan of  EVAL: Patient presents to physical therapy with complaints of chronic low back pain that radiates into the left leg into left scrotum and also into right leg.  Pain is worse in the morning as well with sitting and standing.  Movement and walking tends to help with his pain.  Patient presents with limitations in range of motion and posture that is likely contributing to current presentation.  Patient greatly benefit from skilled PT to address physical  impairments to return patient to optimal function.  OBJECTIVE IMPAIRMENTS: decreased activity tolerance, decreased ROM, decreased strength, postural dysfunction, and pain.   ACTIVITY LIMITATIONS: sitting and standing  PARTICIPATION LIMITATIONS: community activity  PERSONAL FACTORS: Fitness and 1-2 comorbidities: cervical fusion, right biceps repair  are also affecting patient's functional outcome.   REHAB POTENTIAL: Good  CLINICAL DECISION MAKING: Stable/uncomplicated  EVALUATION COMPLEXITY: Low   GOALS: Goals reviewed with patient? yes  SHORT TERM GOALS: Target date: 11/26/2023   Patient will be independent in self management strategies to improve quality of life and functional outcomes. Baseline: New Program Goal status: INITIAL  2.  Patient will report at least 50% improvement in overall symptoms and/or function to demonstrate improved functional mobility Baseline: 0% better Goal status: INITIAL  3.  Patient will be able to stand for 10 minutes at a time without right leg going numb Baseline: Unable Goal status: INITIAL      LONG TERM GOALS: Target date: 01/07/2024    Patient will report at least 75% improvement in overall symptoms and/or function to demonstrate improved functional mobility Baseline: 0% better Goal status: INITIAL  2.  Patient will score at least 2 points higher on PSFS average to demonstrate change in overall function. Baseline: see above Goal status: INITIAL  3.  Patient will be able to report sleeping in the morning without waking up due to pain to improve sleep quality Baseline: Unable painful Goal status: INITIAL   PLAN:  PT FREQUENCY: 1-2x/week  PT DURATION: 12 weeks  PLANNED INTERVENTIONS: 97110-Therapeutic exercises, 97530- Therapeutic activity, 97112- Neuromuscular re-education, 97535- Self Care, [id number]- Manual therapy, [phone]- Gait training, [phone]- Orthotic Fit/training, [phone]- Canalith repositioning, [id number]- Aquatic Therapy, [id number]-  Electrical stimulation (unattended), [phone]- Ionotophoresis 4mg /ml Dexamethasone, Patient/Family education, Balance training, Stair training, Taping, Dry Needling, Joint mobilization, Joint manipulation, Spinal manipulation, Spinal mobilization, Cryotherapy, and Moist heat   PLAN FOR NEXT SESSION: traction, self traction techniques, decompressive exercises, breathing exercises   5:08 PM, 10/28/23 Tabitha Ewings, DPT Physical Therapy with [place]

## 2023-11-04 ENCOUNTER — Encounter: Payer: Self-pay | Admitting: Physical Therapy

## 2023-11-04 ENCOUNTER — Ambulatory Visit (INDEPENDENT_AMBULATORY_CARE_PROVIDER_SITE_OTHER): Admitting: Physical Therapy

## 2023-11-04 DIAGNOSIS — M6281 Muscle weakness (generalized): Secondary | ICD-10-CM

## 2023-11-04 DIAGNOSIS — M5459 Other low back pain: Secondary | ICD-10-CM | POA: Diagnosis not present

## 2023-11-04 NOTE — Therapy (Signed)
 OUTPATIENT PHYSICAL THERAPY THORACOLUMBAR TREATMENT   Patient Name: Keith Mckee MRN: [id number] DOB:1960-08-27, 63 y.o., male Today's Date: 11/04/2023  END OF SESSION:  PT End of Session - 11/04/23 1344     Visit Number 3    Number of Visits 16    Date for PT Re-Evaluation 01/07/24    PT Start Time 1345    PT Stop Time 1431    PT Time Calculation (min) 46 min    Activity Tolerance No increased pain;Patient tolerated treatment well    Behavior During Therapy [facility] for tasks assessed/performed             Past Medical History:  Diagnosis Date   Anxiety    BPH (benign prostatic hypertrophy)    Cataract    artifiicial lenses in both eyes   Cervical spondylosis    with radiculopathy not responsive to conservative therapies:  [facility] 02/2015: ACDF surgery C5-C7 performed 04/05/15   Chronic pain syndrome    cervicalgia, heredetary PN, LBP   COVID-19 virus infection 11/2020   Depression    Difficulty controlling anger    + GAD with OCD tendencies + memory changes---consider neuropsych testing   DISH (diffuse idiopathic skeletal hyperostosis)    Dysphagia    Dysplastic nevus 10/2015   left mid back: Dr. Steen Eden following   GERD (gastroesophageal reflux disease)    +LPR (ENT 09/2022)   Glaucoma    History of adenomatous polyp of colon    IBS (irritable bowel syndrome)    +constipation   Microscopic colitis 06/2019   colonoscopy + 06/2019   Neuropathic pain    Peripheral neuropathy    Idiopathic, inherited per pt's neurologist, Dr. Tonuzi. Various labs normal 07/2016 except vitamin B6 was siginificantly elevated.  Allergist eval/allergy testing (including food) ALL NORMAL/NEG 07/2016.   Restless legs syndrome 09/27/2014   Throat irritation    chronic (since cervical spine surgery 2016).  Dr. Archer Kobs 2017->laryngoscopy normal, CT sinuses normal, barium swallow normal, trial of protonix no help.    Past Surgical History:  Procedure Laterality Date   Ant  cerv decompression/discectomy w/fusion C 5-7  04/05/15   C5-7 ACDF   CATARACT EXTRACTION Bilateral    COLONOSCOPY  06/29/2019   FOR EVAL OF UNEXPLAINED DIARRHEA-->normal, bx c/w microscopic colitis->uceris  and budesonide  rx'd by Dr. Leonia Raman 06/2019   COLONOSCOPY W/ POLYPECTOMY  08/2012; 05/23/16   2017, adenomatous polyp--recall 5 yrs.   EYE SURGERY Right 09/06/2020   TRABECULECTOMY Bilateral    for uncontrolled glaucoma    TRANSURETHRAL RESECTION OF PROSTATE  2008   due to enlarged prostate   VASECTOMY  2001   Patient Active Problem List   Diagnosis Date Noted   Dysphagia 09/26/2022   Hypertropia of right eye 06/12/2021   Cystoid macular edema of right eye 12/05/2020   Irregular astigmatism of right eye 01/26/2019   S/P eye surgery 01/26/2019   Monocular diplopia of right eye 01/14/2019   Pseudophakia of both eyes 08/04/2018   Age-related nuclear cataract of left eye 02/17/2018   Pseudophakia of right eye 02/17/2018   Glaucoma 12/24/2017   Health maintenance examination 12/12/2015   Idiopathic peripheral neuropathy 11/28/2015   Low back pain 11/28/2015   Lumbar facet arthropathy 11/28/2015   Neck pain 11/28/2015   Deviated nasal septum 10/24/2015   Gastroesophageal reflux disease 10/24/2015   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 04/05/2015   Cervical spinal stenosis 04/05/2015   Cervical spondylosis with radiculopathy 04/05/2015   Spondylosis 03/24/2015  Restless legs syndrome 09/27/2014   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   Primary open-angle glaucoma 12/21/2013   Abnormal cardiovascular stress test 10/21/2013   Dyspnea 06/04/2013   Disturbance of skin sensation 06/11/2012   Polyneuropathy in other diseases classified elsewhere (HCC) 06/11/2012   Cervical spondylosis without myelopathy 06/11/2012    PCP: Mandy Second, PA  REFERRING PROVIDER: Mandy Second, PA  REFERRING DIAG: [phone] (ICD-10-CM) - Acute right-sided low back pain  with left-sided sciatica  Rationale for Evaluation and Treatment: Rehabilitation  THERAPY DIAG:  Other low back pain  Muscle weakness (generalized)  ONSET DATE: a while ago  SUBJECTIVE:                                                                                                                                                                                           SUBJECTIVE STATEMENT: 11/04/2023 States he woke up early to bring his wife to the airport and he bent over and had intense back pain in his mid back. States he has had an issue with the thread the needle exercise he tried previously. Some of the exercises (prone lying) causes that pain (right at the thoracolumbar junction). Low back is off and on.    Eval: States he has had this pain for a while. States [place] to [place] and his treatments didn't help States that pain was radiate to his left testicle.  Patient also has a chronic low back pain that radiates into his right leg causing his right leg to go numb after standing for any amount of time.  Reports the mornings are worse and he tosses and turns and cannot sleep.  Reports he historically was working out 5 days a week with hiking and strength training as well as some yoga exercises but has had to put some of this on hold with his recent biceps repair surgery.   PERTINENT HISTORY  Recent distal biceps repair  09/09/23, neuropathy, IBS, DISH, GERD, C5-7 fusion 04/08/15  PAIN:  Are you having pain? Yes: NPRS scale: 2/10 Pain location: low and mid back Pain description: constant ache  Aggravating factors: sitting, AM Relieving factors: standing and walking   PRECAUTIONS: None  RED FLAGS: None   WEIGHT BEARING RESTRICTIONS: No  FALLS:  Has patient fallen in last 6 months? 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  DIAGNOSTIC FINDINGS:  09/10/23 lumbar xray FINDINGS: There is no evidence of  lumbar spine fracture. Alignment is normal. Moderate degenerative disc disease is noted at L1-2, L2-3 and L5-S1. Mild degenerative disc disease is noted at L3-4 and L4-5 with anterior osteophyte formation.   IMPRESSION: Multilevel degenerative changes as noted above. No acute abnormality seen.    PATIENT SURVEYS:  Patient-specific activity functional scoring scheme (Point to one number):  "0" represents "unable to perform." "10" represents "able to perform at prior level. 0 1 2 3 4 5 6 7 8 9  10 (Date and Score) Activity Initial  Activity Eval     Standing a couple minutes   7    Sleeping in AM   5    sitting 7    Additional Additional Total score = sum of the activity scores/number of activities Minimum detectable change (90%CI) for average score = 2 points Minimum detectable change (90%CI) for single activity score = 3 points PSFS developed by: [place] Spitz., & Binkley, J. (1995). Assessing disability and change on individual  patients: a report of a patient specific measure. Physiotherapy [place], 47, [phone]. Reproduced with the permission of the authors  Score: 19/30=6.33   COGNITION: Overall cognitive status: Within functional limits for tasks assessed     SENSATION: Not tested    POSTURE: forward head and decreased lumbar lordosis  PALPATION: NA  LUMBAR ROM:   AROM eval  Flexion 75% limited  Extension 90% limited (felt good)  Right lateral flexion 75% limited * pain in left leg  Left lateral flexion 75% limited * pain in left leg  Right rotation   Left rotation    (Blank rows = not tested)    LE Measurements - will assess in future sessions Lower Extremity Right EVAL Left EVAL   A/PROM MMT A/PROM MMT  Hip Flexion      Hip Extension      Hip Abduction      Hip Adduction      Hip Internal  rotation 10  30   Hip External rotation 70  40   Knee Flexion      Knee Extension      Ankle Dorsiflexion      Ankle Plantarflexion      Ankle Inversion      Ankle Eversion       (Blank rows = not tested) * pain   LUMBAR SPECIAL TESTS:  Slump neg B Ely's test    TREATMENT DATE:                                                                                                                               11/04/2023  Therapeutic Exercise:  Aerobic: Supine:   Prone:   Seated:  Standing: Neuromuscular Re-education: Long exhale breathing with focus on the lower rib decompression and approximation 10 minutes, lateral costal breathing and slightly flexed posture and completely flat in hook lying 10 minutes, long exhale breathing in seated position focused on posture and position 5  minutes,  Manual Therapy: STM and IASTM to thoracic and lumbar paraspinals, right intercostals, right obliques.  PA to lower thoracic spine and right ribs grade II/III tolerated well  Therapeutic Activity: Self Care: Trigger Point Dry Needling:  Modalities:  thermotherapy to lumbar and thoracic muscles in prone    PATIENT EDUCATION:  Education details: on HEP Person educated: Patient Education method: Programmer, multimedia, Facilities manager, and Handouts Education comprehension: verbalized understanding   HOME EXERCISE PROGRAM: [id number] Self traction with bed sheets  ASSESSMENT:  CLINICAL IMPRESSION: Session focused on mid back pain.  Manual interventions tolerated well with reduced pain noted afterwards.  Added breathing exercises to open posterior ribs and improve thoracic and rib mobility as well as reduce intra-abdominal pressures.  Tolerated this well.  Educated patient and anatomy as well as posture and importance of breathing properly to reduce stress on the back.  Overall patient performed well and reported reduced pain and tightness send of session.  Added new exercises to home program.  EVAL: Patient  presents to physical therapy with complaints of chronic low back pain that radiates into the left leg into left scrotum and also into right leg.  Pain is worse in the morning as well with sitting and standing.  Movement and walking tends to help with his pain.  Patient presents with limitations in range of motion and posture that is likely contributing to current presentation.  Patient greatly benefit from skilled PT to address physical impairments to return patient to optimal function.  OBJECTIVE IMPAIRMENTS: decreased activity tolerance, decreased ROM, decreased strength, postural dysfunction, and pain.   ACTIVITY LIMITATIONS: sitting and standing  PARTICIPATION LIMITATIONS: community activity  PERSONAL FACTORS: Fitness and 1-2 comorbidities: cervical fusion, right biceps repair are also affecting patient's functional outcome.   REHAB POTENTIAL: Good  CLINICAL DECISION MAKING: Stable/uncomplicated  EVALUATION COMPLEXITY: Low   GOALS: Goals reviewed with patient? yes  SHORT TERM GOALS: Target date: 11/26/2023   Patient will be independent in self management strategies to improve quality of life and functional outcomes. Baseline: New Program Goal status: INITIAL  2.  Patient will report at least 50% improvement in overall symptoms and/or function to demonstrate improved functional mobility Baseline: 0% better Goal status: INITIAL  3.  Patient will be able to stand for 10 minutes at a time without right leg going numb Baseline: Unable Goal status: INITIAL      LONG TERM GOALS: Target date: 01/07/2024    Patient will report at least 75% improvement in overall symptoms and/or function to demonstrate improved functional mobility Baseline: 0% better Goal status: INITIAL  2.  Patient will score at least 2 points higher on PSFS average to demonstrate change in overall function. Baseline: see above Goal status: INITIAL  3.  Patient will be able to report sleeping in the morning  without waking up due to pain to improve sleep quality Baseline: Unable painful Goal status: INITIAL   PLAN:  PT FREQUENCY: 1-2x/week  PT DURATION: 12 weeks  PLANNED INTERVENTIONS: 97110-Therapeutic exercises, 97530- Therapeutic activity, 97112- Neuromuscular re-education, 97535- Self Care, [id number]- Manual therapy, [phone]- Gait training, [phone]- Orthotic Fit/training, [phone]- Canalith repositioning, [id number]- Aquatic Therapy, [id number]- Electrical stimulation (unattended), [phone]- Ionotophoresis 4mg /ml Dexamethasone, Patient/Family education, Balance training, Stair training, Taping, Dry Needling, Joint mobilization, Joint manipulation, Spinal manipulation, Spinal mobilization, Cryotherapy, and Moist heat   PLAN FOR NEXT SESSION: traction, self traction techniques, decompressive exercises, breathing exercises   2:40 PM, 11/04/23 Tabitha Ewings, DPT Physical Therapy with [facility]

## 2023-11-05 ENCOUNTER — Other Ambulatory Visit: Payer: Self-pay | Admitting: Family Medicine

## 2023-11-05 MED ORDER — HYDROCODONE-ACETAMINOPHEN 7.5-325 MG PO TABS: 1.0000 | ORAL_TABLET | Freq: Four times a day (QID) | ORAL | 0 refills | Status: DC | PRN

## 2023-11-05 MED ORDER — CLONAZEPAM 0.5 MG PO TABS: 0.5000 mg | ORAL_TABLET | Freq: Two times a day (BID) | ORAL | 0 refills | Status: AC | PRN

## 2023-11-09 ENCOUNTER — Other Ambulatory Visit: Payer: Self-pay | Admitting: Family Medicine

## 2023-11-11 ENCOUNTER — Ambulatory Visit (INDEPENDENT_AMBULATORY_CARE_PROVIDER_SITE_OTHER): Admitting: Physical Therapy

## 2023-11-11 ENCOUNTER — Encounter: Payer: Self-pay | Admitting: Physical Therapy

## 2023-11-11 DIAGNOSIS — M5459 Other low back pain: Secondary | ICD-10-CM

## 2023-11-11 DIAGNOSIS — M6281 Muscle weakness (generalized): Secondary | ICD-10-CM

## 2023-11-11 NOTE — Therapy (Signed)
 OUTPATIENT PHYSICAL THERAPY THORACOLUMBAR TREATMENT   Patient Name: Melo Stauber MRN: [id number] DOB:09-17-1960, 63 y.o., male Today's Date: 11/11/2023  END OF SESSION:  PT End of Session - 11/11/23 1348     Visit Number 4    Number of Visits 16    Date for PT Re-Evaluation 01/07/24    PT Start Time 1348    PT Stop Time 1426    PT Time Calculation (min) 38 min    Activity Tolerance No increased pain;Patient tolerated treatment well    Behavior During Therapy [facility] for tasks assessed/performed             Past Medical History:  Diagnosis Date   Anxiety    BPH (benign prostatic hypertrophy)    Cataract    artifiicial lenses in both eyes   Cervical spondylosis    with radiculopathy not responsive to conservative therapies:  [facility] 02/2015: ACDF surgery C5-C7 performed 04/05/15   Chronic pain syndrome    cervicalgia, heredetary PN, LBP   COVID-19 virus infection 11/2020   Depression    Difficulty controlling anger    + GAD with OCD tendencies + memory changes---consider neuropsych testing   DISH (diffuse idiopathic skeletal hyperostosis)    Dysphagia    Dysplastic nevus 10/2015   left mid back: Dr. Steen Eden following   GERD (gastroesophageal reflux disease)    +LPR (ENT 09/2022)   Glaucoma    History of adenomatous polyp of colon    IBS (irritable bowel syndrome)    +constipation   Microscopic colitis 06/2019   colonoscopy + 06/2019   Neuropathic pain    Peripheral neuropathy    Idiopathic, inherited per pt's neurologist, Dr. Tonuzi. Various labs normal 07/2016 except vitamin B6 was siginificantly elevated.  Allergist eval/allergy testing (including food) ALL NORMAL/NEG 07/2016.   Restless legs syndrome 09/27/2014   Throat irritation    chronic (since cervical spine surgery 2016).  Dr. Archer Kobs 2017->laryngoscopy normal, CT sinuses normal, barium swallow normal, trial of protonix no help.    Past Surgical History:  Procedure Laterality Date   Ant  cerv decompression/discectomy w/fusion C 5-7  04/05/15   C5-7 ACDF   CATARACT EXTRACTION Bilateral    COLONOSCOPY  06/29/2019   FOR EVAL OF UNEXPLAINED DIARRHEA-->normal, bx c/w microscopic colitis->uceris  and budesonide  rx'd by Dr. Leonia Raman 06/2019   COLONOSCOPY W/ POLYPECTOMY  08/2012; 05/23/16   2017, adenomatous polyp--recall 5 yrs.   EYE SURGERY Right 09/06/2020   TRABECULECTOMY Bilateral    for uncontrolled glaucoma    TRANSURETHRAL RESECTION OF PROSTATE  2008   due to enlarged prostate   VASECTOMY  2001   Patient Active Problem List   Diagnosis Date Noted   Dysphagia 09/26/2022   Hypertropia of right eye 06/12/2021   Cystoid macular edema of right eye 12/05/2020   Irregular astigmatism of right eye 01/26/2019   S/P eye surgery 01/26/2019   Monocular diplopia of right eye 01/14/2019   Pseudophakia of both eyes 08/04/2018   Age-related nuclear cataract of left eye 02/17/2018   Pseudophakia of right eye 02/17/2018   Glaucoma 12/24/2017   Health maintenance examination 12/12/2015   Idiopathic peripheral neuropathy 11/28/2015   Low back pain 11/28/2015   Lumbar facet arthropathy 11/28/2015   Neck pain 11/28/2015   Deviated nasal septum 10/24/2015   Gastroesophageal reflux disease 10/24/2015   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 04/05/2015   Cervical spinal stenosis 04/05/2015   Cervical spondylosis with radiculopathy 04/05/2015   Spondylosis 03/24/2015  Restless legs syndrome 09/27/2014   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   Primary open-angle glaucoma 12/21/2013   Abnormal cardiovascular stress test 10/21/2013   Dyspnea 06/04/2013   Disturbance of skin sensation 06/11/2012   Polyneuropathy in other diseases classified elsewhere (HCC) 06/11/2012   Cervical spondylosis without myelopathy 06/11/2012    PCP: Mandy Second, PA  REFERRING PROVIDER: Mandy Second, PA  REFERRING DIAG: [phone] (ICD-10-CM) - Acute right-sided low back pain  with left-sided sciatica  Rationale for Evaluation and Treatment: Rehabilitation  THERAPY DIAG:  Other low back pain  Muscle weakness (generalized)  ONSET DATE: a while ago  SUBJECTIVE:                                                                                                                                                                                           SUBJECTIVE STATEMENT: 11/11/2023 States now every time he gets on his back his middle back hurts. Rolling over also gets his mid back to bother him. Bridge and pelvic rotation bother it. Traction stretch is bothering his mid back.   Eval: States he has had this pain for a while. States he [facility] to [facility] and his treatments didn't help States that pain was radiate to his left testicle.  Patient also has a chronic low back pain that radiates into his right leg causing his right leg to go numb after standing for any amount of time.  Reports the mornings are worse and he tosses and turns and cannot sleep.  Reports he historically was working out 5 days a week with hiking and strength training as well as some yoga exercises but has had to put some of this on hold with his recent biceps repair surgery.   PERTINENT HISTORY  Recent distal biceps repair  09/09/23, neuropathy, IBS, DISH, GERD, C5-7 fusion 04/08/15  PAIN:  Are you having pain? Yes: NPRS scale: 2/10 Pain location: low and mid back Pain description: constant ache  Aggravating factors: sitting, AM Relieving factors: standing and walking   PRECAUTIONS: None  RED FLAGS: None   WEIGHT BEARING RESTRICTIONS: No  FALLS:  Has patient fallen in last 6 months? 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  DIAGNOSTIC FINDINGS:  09/10/23 lumbar xray FINDINGS: There is no  evidence of lumbar spine fracture. Alignment is normal. Moderate degenerative disc disease is noted at L1-2, L2-3 and L5-S1. Mild degenerative disc disease is noted at L3-4  and L4-5 with anterior osteophyte formation.   IMPRESSION: Multilevel degenerative changes as noted above. No acute abnormality seen.    PATIENT SURVEYS:  Patient-specific activity functional scoring scheme (Point to one number):  "0" represents "unable to perform." "10" represents "able to perform at prior level. 0 1 2 3 4 5 6 7 8 9  10 (Date and Score) Activity Initial  Activity Eval     Standing a couple minutes   7    Sleeping in AM   5    sitting 7    Additional Additional Total score = sum of the activity scores/number of activities Minimum detectable change (90%CI) for average score = 2 points Minimum detectable change (90%CI) for single activity score = 3 points PSFS developed by: [place] Spitz., & Binkley, J. (1995). Assessing disability and change on individual  patients: a report of a patient specific measure. Physiotherapy [place], [phone], [phone]. Reproduced with the permission of the authors  Score: 19/30=6.33   COGNITION: Overall cognitive status: Within functional limits for tasks assessed     SENSATION: Not tested    POSTURE: forward head and decreased lumbar lordosis  PALPATION: NA  LUMBAR ROM:   AROM eval  Flexion 75% limited  Extension 90% limited (felt good)  Right lateral flexion 75% limited * pain in left leg  Left lateral flexion 75% limited * pain in left leg  Right rotation   Left rotation    (Blank rows = not tested)    LE Measurements - will assess in future sessions Lower Extremity Right EVAL Left EVAL   A/PROM MMT A/PROM MMT  Hip Flexion      Hip Extension      Hip Abduction      Hip Adduction      Hip Internal rotation 10  30   Hip External rotation 70  40   Knee Flexion      Knee Extension      Ankle Dorsiflexion      Ankle  Plantarflexion      Ankle Inversion      Ankle Eversion       (Blank rows = not tested) * pain   LUMBAR SPECIAL TESTS:  Slump neg B Ely's test    TREATMENT DATE:                                                                                                                               11/11/2023  Therapeutic Exercise:  Aerobic: Supine:  pelvic tilt with hips elevated and long exhale breathing - 8 minutes Prone:   Seated:  Standing: lat stretch at wall with breathing 5 minutes total, self mobilization with tennis ball to thoracic paraspinals - 5 minutes Neuromuscular Re-education: Long exhale breathing with focus on the lower rib decompression paired with lateral costal breathing in prone - tolerated well 8 minutes Manual Therapy: STM and IASTM to thoracic and lumbar paraspinals, right intercostals, RIGHT  LAT - tolerated well, cupping to mid back with PT manipulation of cups   Therapeutic Activity: Self Care: Trigger Point Dry Needling:  Modalities:  thermotherapy to lumbar and thoracic muscles in prone    PATIENT EDUCATION:  Education details: on HEP Person educated: Patient Education method: Programmer, multimedia, Facilities manager, and Handouts Education comprehension: verbalized understanding   HOME EXERCISE PROGRAM: [id number] Self traction with bed sheets  ASSESSMENT:  CLINICAL IMPRESSION: Continued with focus on mid back as this was primary limiting factor at this time.  Tolerated manual interventions well and reduced pain noted with pelvic tilts afterwards.  Educated patient in self mobilization as well as LAT stretch to address mid back symptoms.  Less symptoms noted end of session.  Discussed putting prone exercises on hold if continues to flareup mid back but did discuss use of pillows under pelvis to reduce lumbar extension in hopes this would reduce post exercise symptoms.  Will continue to follow-up with mid and low back symptoms and continue with current plan of care as  tolerated.  EVAL: Patient presents to physical therapy with complaints of chronic low back pain that radiates into the left leg into left scrotum and also into right leg.  Pain is worse in the morning as well with sitting and standing.  Movement and walking tends to help with his pain.  Patient presents with limitations in range of motion and posture that is likely contributing to current presentation.  Patient greatly benefit from skilled PT to address physical impairments to return patient to optimal function.  OBJECTIVE IMPAIRMENTS: decreased activity tolerance, decreased ROM, decreased strength, postural dysfunction, and pain.   ACTIVITY LIMITATIONS: sitting and standing  PARTICIPATION LIMITATIONS: community activity  PERSONAL FACTORS: Fitness and 1-2 comorbidities: cervical fusion, right biceps repair are also affecting patient's functional outcome.   REHAB POTENTIAL: Good  CLINICAL DECISION MAKING: Stable/uncomplicated  EVALUATION COMPLEXITY: Low   GOALS: Goals reviewed with patient? yes  SHORT TERM GOALS: Target date: 11/26/2023   Patient will be independent in self management strategies to improve quality of life and functional outcomes. Baseline: New Program Goal status: INITIAL  2.  Patient will report at least 50% improvement in overall symptoms and/or function to demonstrate improved functional mobility Baseline: 0% better Goal status: INITIAL  3.  Patient will be able to stand for 10 minutes at a time without right leg going numb Baseline: Unable Goal status: INITIAL      LONG TERM GOALS: Target date: 01/07/2024    Patient will report at least 75% improvement in overall symptoms and/or function to demonstrate improved functional mobility Baseline: 0% better Goal status: INITIAL  2.  Patient will score at least 2 points higher on PSFS average to demonstrate change in overall function. Baseline: see above Goal status: INITIAL  3.  Patient will be able to  report sleeping in the morning without waking up due to pain to improve sleep quality Baseline: Unable painful Goal status: INITIAL   PLAN:  PT FREQUENCY: 1-2x/week  PT DURATION: 12 weeks  PLANNED INTERVENTIONS: 97110-Therapeutic exercises, 97530- Therapeutic activity, 97112- Neuromuscular re-education, 97535- Self Care, 54098- Manual therapy, [phone]- Gait training, [phone]- Orthotic Fit/training, [phone]- Canalith repositioning, [id number]- Aquatic Therapy, [id number]- Electrical stimulation (unattended), [phone]- Ionotophoresis 4mg /ml Dexamethasone, Patient/Family education, Balance training, Stair training, Taping, Dry Needling, Joint mobilization, Joint manipulation, Spinal manipulation, Spinal mobilization, Cryotherapy, and Moist heat   PLAN FOR NEXT SESSION: traction, self traction techniques, decompressive exercises, breathing exercises   2:35 PM, 11/11/23 Tabitha Ewings, DPT Physical Therapy with  [facility]

## 2023-11-19 ENCOUNTER — Encounter: Payer: Self-pay | Admitting: Physical Therapy

## 2023-11-19 ENCOUNTER — Ambulatory Visit (INDEPENDENT_AMBULATORY_CARE_PROVIDER_SITE_OTHER): Admitting: Physical Therapy

## 2023-11-19 DIAGNOSIS — M6281 Muscle weakness (generalized): Secondary | ICD-10-CM

## 2023-11-19 DIAGNOSIS — M5459 Other low back pain: Secondary | ICD-10-CM | POA: Diagnosis not present

## 2023-11-19 NOTE — Therapy (Signed)
 OUTPATIENT PHYSICAL THERAPY THORACOLUMBAR TREATMENT   Patient Name: Keith Mckee MRN: [id number] DOB:[birth year], 63 y.o., male Today's Date: 11/19/2023  END OF SESSION:  PT End of Session - 11/19/23 0933     Visit Number 5    Number of Visits 16    Date for PT Re-Evaluation 01/07/24    PT Start Time 0933    PT Stop Time 1012    PT Time Calculation (min) 39 min    Activity Tolerance No increased pain;Patient tolerated treatment well    Behavior During Therapy [facility] for tasks assessed/performed             Past Medical History:  Diagnosis Date   Anxiety    BPH (benign prostatic hypertrophy)    Cataract    artifiicial lenses in both eyes   Cervical spondylosis    with radiculopathy not responsive to conservative therapies:  [facility] 02/2015: ACDF surgery C5-C7 performed 04/05/15   Chronic pain syndrome    cervicalgia, heredetary PN, LBP   COVID-19 virus infection 11/2020   Depression    Difficulty controlling anger    + GAD with OCD tendencies + memory changes---consider neuropsych testing   DISH (diffuse idiopathic skeletal hyperostosis)    Dysphagia    Dysplastic nevus 10/2015   left mid back: Dr. Steen Eden following   GERD (gastroesophageal reflux disease)    +LPR (ENT 09/2022)   Glaucoma    History of adenomatous polyp of colon    IBS (irritable bowel syndrome)    +constipation   Microscopic colitis 06/2019   colonoscopy + 06/2019   Neuropathic pain    Peripheral neuropathy    Idiopathic, inherited per pt's neurologist, Dr. Tonuzi. Various labs normal 07/2016 except vitamin B6 was siginificantly elevated.  Allergist eval/allergy testing (including food) ALL NORMAL/NEG 07/2016.   Restless legs syndrome 09/27/2014   Throat irritation    chronic (since cervical spine surgery 2016).  Dr. Archer Kobs 2017->laryngoscopy normal, CT sinuses normal, barium swallow normal, trial of protonix no help.    Past Surgical History:  Procedure Laterality Date   Ant  cerv decompression/discectomy w/fusion C 5-7  04/05/15   C5-7 ACDF   CATARACT EXTRACTION Bilateral    COLONOSCOPY  06/29/2019   FOR EVAL OF UNEXPLAINED DIARRHEA-->normal, bx c/w microscopic colitis->uceris  and budesonide  rx'd by Dr. Leonia Raman 06/2019   COLONOSCOPY W/ POLYPECTOMY  08/2012; 05/23/16   2017, adenomatous polyp--recall 5 yrs.   EYE SURGERY Right 09/06/2020   TRABECULECTOMY Bilateral    for uncontrolled glaucoma    TRANSURETHRAL RESECTION OF PROSTATE  2008   due to enlarged prostate   VASECTOMY  2001   Patient Active Problem List   Diagnosis Date Noted   Dysphagia 09/26/2022   Hypertropia of right eye 06/12/2021   Cystoid macular edema of right eye 12/05/2020   Irregular astigmatism of right eye 01/26/2019   S/P eye surgery 01/26/2019   Monocular diplopia of right eye 01/14/2019   Pseudophakia of both eyes 08/04/2018   Age-related nuclear cataract of left eye 02/17/2018   Pseudophakia of right eye 02/17/2018   Glaucoma 12/24/2017   Health maintenance examination 12/12/2015   Idiopathic peripheral neuropathy 11/28/2015   Low back pain 11/28/2015   Lumbar facet arthropathy 11/28/2015   Neck pain 11/28/2015   Deviated nasal septum 10/24/2015   Gastroesophageal reflux disease 10/24/2015   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 04/05/2015   Cervical spinal stenosis 04/05/2015   Cervical spondylosis with radiculopathy 04/05/2015   Spondylosis 03/24/2015  Restless legs syndrome 09/27/2014   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   Primary open-angle glaucoma 12/21/2013   Abnormal cardiovascular stress test 10/21/2013   Dyspnea 06/04/2013   Disturbance of skin sensation 06/11/2012   Polyneuropathy in other diseases classified elsewhere (HCC) 06/11/2012   Cervical spondylosis without myelopathy 06/11/2012    PCP: Mandy Second, PA  REFERRING PROVIDER: Mandy Second, PA  REFERRING DIAG: [phone] (ICD-10-CM) - Acute right-sided low back pain  with left-sided sciatica  Rationale for Evaluation and Treatment: Rehabilitation  THERAPY DIAG:  Other low back pain  Muscle weakness (generalized)  ONSET DATE: a while ago  SUBJECTIVE:                                                                                                                                                                                           SUBJECTIVE STATEMENT: 11/19/2023 States his back pain comes and goes. States that he is using the tennis ball and there are three spots that are really tender. States he did his stretches.    Eval: States he has had this pain for a while. States [facility] and his treatments didn't help States that pain was radiate to his left testicle.  Patient also has a chronic low back pain that radiates into his right leg causing his right leg to go numb after standing for any amount of time.  Reports the mornings are worse and he tosses and turns and cannot sleep.  Reports he historically was working out 5 days a week with hiking and strength training as well as some yoga exercises but has had to put some of this on hold with his recent biceps repair surgery.   PERTINENT HISTORY  Recent distal biceps repair  09/09/23, neuropathy, IBS, DISH, GERD, C5-7 fusion 04/08/15  PAIN:  Are you having pain? Yes: NPRS scale: 2/10 Pain location: low and mid back Pain description: constant ache  Aggravating factors: sitting, AM Relieving factors: standing and walking   PRECAUTIONS: None  RED FLAGS: None   WEIGHT BEARING RESTRICTIONS: No  FALLS:  Has patient fallen in last 6 months? 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  DIAGNOSTIC FINDINGS:  09/10/23 lumbar xray FINDINGS: There is no evidence of lumbar spine fracture. Alignment  is normal. Moderate degenerative disc disease is noted at L1-2, L2-3 and L5-S1. Mild degenerative disc disease is noted at L3-4 and L4-5 with anterior osteophyte formation.  IMPRESSION: Multilevel degenerative changes as noted above. No acute abnormality seen.    PATIENT SURVEYS:  Patient-specific activity functional scoring scheme (Point to one number):  "0" represents "unable to perform." "10" represents "able to perform at prior level. 0 1 2 3 4 5 6 7 8 9  10 (Date and Score) Activity Initial  Activity Eval     Standing a couple minutes   7    Sleeping in AM   5    sitting 7    Additional Additional Total score = sum of the activity scores/number of activities Minimum detectable change (90%CI) for average score = 2 points Minimum detectable change (90%CI) for single activity score = 3 points PSFS developed by: [place] Spitz., & Binkley, J. (1995). Assessing disability and change on individual  patients: a report of a patient specific measure. Physiotherapy [place], 47, [phone]. Reproduced with the permission of the authors  Score: 19/30=6.33   COGNITION: Overall cognitive status: Within functional limits for tasks assessed     SENSATION: Not tested    POSTURE: forward head and decreased lumbar lordosis  PALPATION: NA  LUMBAR ROM:   AROM eval  Flexion 75% limited  Extension 90% limited (felt good)  Right lateral flexion 75% limited * pain in left leg  Left lateral flexion 75% limited * pain in left leg  Right rotation   Left rotation    (Blank rows = not tested)    LE Measurements - will assess in future sessions Lower Extremity Right EVAL Left EVAL   A/PROM MMT A/PROM MMT  Hip Flexion      Hip Extension      Hip Abduction      Hip Adduction      Hip Internal rotation 10  30   Hip External rotation 70  40   Knee Flexion      Knee Extension      Ankle Dorsiflexion      Ankle Plantarflexion      Ankle Inversion       Ankle Eversion       (Blank rows = not tested) * pain   LUMBAR SPECIAL TESTS:  Slump neg B Ely's test    TREATMENT DATE:                                                                                                                               11/19/2023  Therapeutic Exercise:  Review of HEP Supine:   hook lying hip add iso with ball x5 30" holds  Prone:   Seated:  Standing self mobilization with tennis ball to thoracic paraspinals - 5 minutes Neuromuscular Re-education: posture with knees in neutral position - typically sits with legs abd/ER- practiced in clinic, boat pose with support and verbal cues for posture and body awareness- feet in hook lying focus on hip add activation with holds, reach towards ceiling for initiation of sit up with no motion x  510-second holds- 15 minutes with education Manual Therapy:    Therapeutic Activity: log rolling with hips in neutral - reach with top arm and hip add activation with ball 10 minutes education and practice and rationale behind interventions Self Care: Trigger Point Dry Needling:  Modalities:  thermotherapy to lumbar and thoracic muscles in prone    PATIENT EDUCATION:  Education details: on HEP Person educated: Patient Education method: Programmer, multimedia, Facilities manager, and Handouts Education comprehension: verbalized understanding   HOME EXERCISE PROGRAM: [id number] Self traction with bed sheets Boat pose, hip add iso Hip add iso with log roll Sitting posture with hips in neutral  ASSESSMENT:  CLINICAL IMPRESSION: Session focused on mid back pain as well as rational behind interventions and exercises.  Patient with limited hip adduction activation and static postures.  Focused on activating these muscles as well as lower core activation and modified boat pose.  Tolerated well with no reports of pain with tactile cues.  Fatigue and core and legs noted.  Continued discomfort with rolling but this reduced with activation of  hip adductors.  Educated patient current presentation as well as plan moving forward.  Instructed patient to focus on this over the next week to determine if more active core will help reduce overactive thoracic paraspinals.  Will continue with current plan of care as tolerated.   EVAL: Patient presents to physical therapy with complaints of chronic low back pain that radiates into the left leg into left scrotum and also into right leg.  Pain is worse in the morning as well with sitting and standing.  Movement and walking tends to help with his pain.  Patient presents with limitations in range of motion and posture that is likely contributing to current presentation.  Patient greatly benefit from skilled PT to address physical impairments to return patient to optimal function.  OBJECTIVE IMPAIRMENTS: decreased activity tolerance, decreased ROM, decreased strength, postural dysfunction, and pain.   ACTIVITY LIMITATIONS: sitting and standing  PARTICIPATION LIMITATIONS: community activity  PERSONAL FACTORS: Fitness and 1-2 comorbidities: cervical fusion, right biceps repair are also affecting patient's functional outcome.   REHAB POTENTIAL: Good  CLINICAL DECISION MAKING: Stable/uncomplicated  EVALUATION COMPLEXITY: Low   GOALS: Goals reviewed with patient? yes  SHORT TERM GOALS: Target date: 11/26/2023   Patient will be independent in self management strategies to improve quality of life and functional outcomes. Baseline: New Program Goal status: INITIAL  2.  Patient will report at least 50% improvement in overall symptoms and/or function to demonstrate improved functional mobility Baseline: 0% better Goal status: INITIAL  3.  Patient will be able to stand for 10 minutes at a time without right leg going numb Baseline: Unable Goal status: INITIAL      LONG TERM GOALS: Target date: 01/07/2024    Patient will report at least 75% improvement in overall symptoms and/or function to  demonstrate improved functional mobility Baseline: 0% better Goal status: INITIAL  2.  Patient will score at least 2 points higher on PSFS average to demonstrate change in overall function. Baseline: see above Goal status: INITIAL  3.  Patient will be able to report sleeping in the morning without waking up due to pain to improve sleep quality Baseline: Unable painful Goal status: INITIAL   PLAN:  PT FREQUENCY: 1-2x/week  PT DURATION: 12 weeks  PLANNED INTERVENTIONS: 97110-Therapeutic exercises, 97530- Therapeutic activity, [id number]- Neuromuscular re-education, 97535- Self Care, [id number]- Manual therapy, [id number]- Gait training, [phone]- Orthotic Fit/training, [phone]- Canalith repositioning, [id number]- Aquatic Therapy, [id number]- Electrical stimulation (  unattended), [phone]- Ionotophoresis 4mg /ml Dexamethasone, Patient/Family education, Balance training, Stair training, Taping, Dry Needling, Joint mobilization, Joint manipulation, Spinal manipulation, Spinal mobilization, Cryotherapy, and Moist heat   PLAN FOR NEXT SESSION: traction, self traction techniques, decompressive exercises, breathing exercises   10:38 AM, 11/19/23 Tabitha Ewings, DPT Physical Therapy with [place]

## 2023-11-25 ENCOUNTER — Encounter: Payer: Self-pay | Admitting: Physical Therapy

## 2023-11-25 ENCOUNTER — Ambulatory Visit (INDEPENDENT_AMBULATORY_CARE_PROVIDER_SITE_OTHER): Admitting: Physical Therapy

## 2023-11-25 DIAGNOSIS — M5459 Other low back pain: Secondary | ICD-10-CM | POA: Diagnosis not present

## 2023-11-25 DIAGNOSIS — M6281 Muscle weakness (generalized): Secondary | ICD-10-CM

## 2023-11-25 NOTE — Therapy (Signed)
 OUTPATIENT PHYSICAL THERAPY THORACOLUMBAR TREATMENT   Patient Name: Keith Mckee MRN: [id number] DOB:26-May-1961, 63 y.o., male Today's Date: 11/25/2023  END OF SESSION:  PT End of Session - 11/25/23 0936     Visit Number 6    Number of Visits 16    Date for PT Re-Evaluation 01/07/24    PT Start Time 0936    PT Stop Time 1014    PT Time Calculation (min) 38 min    Activity Tolerance No increased pain;Patient tolerated treatment well    Behavior During Therapy [facility] for tasks assessed/performed              Past Medical History:  Diagnosis Date   Anxiety    BPH (benign prostatic hypertrophy)    Cataract    artifiicial lenses in both eyes   Cervical spondylosis    with radiculopathy not responsive to conservative therapies:  [facility]: ACDF surgery C5-C7 performed 04/05/15   Chronic pain syndrome    cervicalgia, heredetary PN, LBP   COVID-19 virus infection 11/2020   Depression    Difficulty controlling anger    + GAD with OCD tendencies + memory changes---consider neuropsych testing   DISH (diffuse idiopathic skeletal hyperostosis)    Dysphagia    Dysplastic nevus 10/2015   left mid back: Dr. Steen Eden following   GERD (gastroesophageal reflux disease)    +LPR (ENT 09/2022)   Glaucoma    History of adenomatous polyp of colon    IBS (irritable bowel syndrome)    +constipation   Microscopic colitis 06/2019   colonoscopy + 06/2019   Neuropathic pain    Peripheral neuropathy    Idiopathic, inherited per pt's neurologist, Dr. Tonuzi. Various labs normal 07/2016 except vitamin B6 was siginificantly elevated.  Allergist eval/allergy testing (including food) ALL NORMAL/NEG 07/2016.   Restless legs syndrome 09/27/2014   Throat irritation    chronic (since cervical spine surgery 2016).  Dr. Archer Kobs 2017->laryngoscopy normal, CT sinuses normal, barium swallow normal, trial of protonix no help.    Past Surgical History:  Procedure Laterality Date   Ant  cerv decompression/discectomy w/fusion C 5-7  04/05/15   C5-7 ACDF   CATARACT EXTRACTION Bilateral    COLONOSCOPY  06/29/2019   FOR EVAL OF UNEXPLAINED DIARRHEA-->normal, bx c/w microscopic colitis->uceris  and budesonide  rx'd by Dr. Leonia Raman 06/2019   COLONOSCOPY W/ POLYPECTOMY  08/2012; 05/23/16   2017, adenomatous polyp--recall 5 yrs.   EYE SURGERY Right 09/06/2020   TRABECULECTOMY Bilateral    for uncontrolled glaucoma    TRANSURETHRAL RESECTION OF PROSTATE  2008   due to enlarged prostate   VASECTOMY  2001   Patient Active Problem List   Diagnosis Date Noted   Dysphagia 09/26/2022   Hypertropia of right eye 06/12/2021   Cystoid macular edema of right eye 12/05/2020   Irregular astigmatism of right eye 01/26/2019   S/P eye surgery 01/26/2019   Monocular diplopia of right eye 01/14/2019   Pseudophakia of both eyes 08/04/2018   Age-related nuclear cataract of left eye 02/17/2018   Pseudophakia of right eye 02/17/2018   Glaucoma 12/24/2017   Health maintenance examination 12/12/2015   Idiopathic peripheral neuropathy 11/28/2015   Low back pain 11/28/2015   Lumbar facet arthropathy 11/28/2015   Neck pain 11/28/2015   Deviated nasal septum 10/24/2015   Gastroesophageal reflux disease 10/24/2015   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 04/05/2015   Cervical spinal stenosis 04/05/2015   Cervical spondylosis with radiculopathy 04/05/2015   Spondylosis  03/24/2015   Restless legs syndrome 09/27/2014   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   Primary open-angle glaucoma 12/21/2013   Abnormal cardiovascular stress test 10/21/2013   Dyspnea 06/04/2013   Disturbance of skin sensation 06/11/2012   Polyneuropathy in other diseases classified elsewhere (HCC) 06/11/2012   Cervical spondylosis without myelopathy 06/11/2012    PCP: Mandy Second, PA  REFERRING PROVIDER: Mandy Second, PA  REFERRING DIAG: [phone] (ICD-10-CM) - Acute right-sided low back pain  with left-sided sciatica  Rationale for Evaluation and Treatment: Rehabilitation  THERAPY DIAG:  Other low back pain  Muscle weakness (generalized)  ONSET DATE: a while ago  SUBJECTIVE:                                                                                                                                                                                           SUBJECTIVE STATEMENT: 11/25/2023 States he is feeling the same. States every time he gets flat on his back he has the pain is there and it doesn't seem to get any better. States he is now starting to feel his back in bed which is new.      Eval: States he has had this pain for a while. States [facility] and his treatments didn't help States that pain was radiate to his left testicle.  Patient also has a chronic low back pain that radiates into his right leg causing his right leg to go numb after standing for any amount of time.  Reports the mornings are worse and he tosses and turns and cannot sleep.  Reports he historically was working out 5 days a week with hiking and strength training as well as some yoga exercises but has had to put some of this on hold with his recent biceps repair surgery.   PERTINENT HISTORY  Recent distal biceps repair  09/09/23, neuropathy, IBS, DISH, GERD, C5-7 fusion 04/08/15  PAIN:  Are you having pain? Yes: NPRS scale: 3/10 Pain location: low and mid back Pain description: constant ache  Aggravating factors: sitting, AM Relieving factors: standing and walking   PRECAUTIONS: None  RED FLAGS: None   WEIGHT BEARING RESTRICTIONS: No  FALLS:  Has patient fallen in last 6 months? 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  DIAGNOSTIC FINDINGS:  09/10/23 lumbar xray FINDINGS: There  is no evidence of lumbar spine fracture. Alignment is normal. Moderate degenerative disc disease is noted  at L1-2, L2-3 and L5-S1. Mild degenerative disc disease is noted at L3-4 and L4-5 with anterior osteophyte formation.   IMPRESSION: Multilevel degenerative changes as noted above. No acute abnormality seen.    PATIENT SURVEYS:  Patient-specific activity functional scoring scheme (Point to one number):  "0" represents "unable to perform." "10" represents "able to perform at prior level. 0 1 2 3 4 5 6 7 8 9  10 (Date and Score) Activity Initial  Activity Eval     Standing a couple minutes   7    Sleeping in AM   5    sitting 7    Additional Additional Total score = sum of the activity scores/number of activities Minimum detectable change (90%CI) for average score = 2 points Minimum detectable change (90%CI) for single activity score = 3 points PSFS developed by: [place] Spitz., & Binkley, J. (1995). Assessing disability and change on individual  patients: a report of a patient specific measure. Physiotherapy [place], [phone], [phone]. Reproduced with the permission of the authors  Score: 19/30=6.33   COGNITION: Overall cognitive status: Within functional limits for tasks assessed     SENSATION: Not tested    POSTURE: forward head and decreased lumbar lordosis  PALPATION: NA  LUMBAR ROM:   AROM eval  Flexion 75% limited  Extension 90% limited (felt good)  Right lateral flexion 75% limited * pain in left leg  Left lateral flexion 75% limited * pain in left leg  Right rotation   Left rotation    (Blank rows = not tested)    LE Measurements - will assess in future sessions Lower Extremity Right EVAL Left EVAL   A/PROM MMT A/PROM MMT  Hip Flexion      Hip Extension      Hip Abduction      Hip Adduction      Hip Internal rotation 10  30   Hip External rotation 70  40   Knee Flexion      Knee Extension      Ankle Dorsiflexion       Ankle Plantarflexion      Ankle Inversion      Ankle Eversion       (Blank rows = not tested) * pain   LUMBAR SPECIAL TESTS:  Slump neg B Ely's test    TREATMENT DATE:                                                                                                                               11/25/2023  Therapeutic Exercise:  Review of HEP, assessment of cervical spine and supine lying without support Supine:   neck rotation with holds for stretching B 5 minutes total, neck flexion/extension 5 minutes total on ball  Prone:   Seated:  Standing  Neuromuscular Re-education:on postures/ positions/neck position and support - not going into end range extension supine without cervical support and how pillow ball helps.  Manual  Therapy:  STM to SCM, anterior scalenes, medial glides of cervical spine grade 2 focused bilateral knee STM to upper traps and levator  Therapeutic Activity:  Self Care: Trigger Point Dry Needling:  Modalities:      PATIENT EDUCATION:  Education details: on HEP Person educated: Patient Education method: Programmer, multimedia, Facilities manager, and Handouts Education comprehension: verbalized understanding   HOME EXERCISE PROGRAM: [id number] Self traction with bed sheets Boat pose, hip add iso Hip add iso with log roll Sitting posture with hips in neutral  ASSESSMENT:  CLINICAL IMPRESSION: Assessment of cervical spine on this date secondary to continued mid thoracic pain not improving with thoracic interventions.  Noted patient posture and neck position in supine when symptoms are at their worst in their mid back.  Educated patient on anatomy and posture as well as current presentation.  Responded well to cervical interventions reporting reduced symptoms in mid back upon laying down and upon rolling up to sitting.  Will continue with gentle cervical exercises to address mid back pain.  If symptoms do not improve or continue to worsen will follow-up with  MD.   EVAL: Patient presents to physical therapy with complaints of chronic low back pain that radiates into the left leg into left scrotum and also into right leg.  Pain is worse in the morning as well with sitting and standing.  Movement and walking tends to help with his pain.  Patient presents with limitations in range of motion and posture that is likely contributing to current presentation.  Patient greatly benefit from skilled PT to address physical impairments to return patient to optimal function.  OBJECTIVE IMPAIRMENTS: decreased activity tolerance, decreased ROM, decreased strength, postural dysfunction, and pain.   ACTIVITY LIMITATIONS: sitting and standing  PARTICIPATION LIMITATIONS: community activity  PERSONAL FACTORS: Fitness and 1-2 comorbidities: cervical fusion, right biceps repair are also affecting patient's functional outcome.   REHAB POTENTIAL: Good  CLINICAL DECISION MAKING: Stable/uncomplicated  EVALUATION COMPLEXITY: Low   GOALS: Goals reviewed with patient? yes  SHORT TERM GOALS: Target date: 11/26/2023   Patient will be independent in self management strategies to improve quality of life and functional outcomes. Baseline: New Program Goal status: INITIAL  2.  Patient will report at least 50% improvement in overall symptoms and/or function to demonstrate improved functional mobility Baseline: 0% better Goal status: INITIAL  3.  Patient will be able to stand for 10 minutes at a time without right leg going numb Baseline: Unable Goal status: INITIAL      LONG TERM GOALS: Target date: 01/07/2024    Patient will report at least 75% improvement in overall symptoms and/or function to demonstrate improved functional mobility Baseline: 0% better Goal status: INITIAL  2.  Patient will score at least 2 points higher on PSFS average to demonstrate change in overall function. Baseline: see above Goal status: INITIAL  3.  Patient will be able to report  sleeping in the morning without waking up due to pain to improve sleep quality Baseline: Unable painful Goal status: INITIAL   PLAN:  PT FREQUENCY: 1-2x/week  PT DURATION: 12 weeks  PLANNED INTERVENTIONS: 97110-Therapeutic exercises, 97530- Therapeutic activity, [id number]- Neuromuscular re-education, 97535- Self Care, 54098- Manual therapy, [id number]- Gait training, [id number]- Orthotic Fit/training, [phone]- Canalith repositioning, [id number]- Aquatic Therapy, [id number]- Electrical stimulation (unattended), [phone]- Ionotophoresis 4mg /ml Dexamethasone, Patient/Family education, Balance training, Stair training, Taping, Dry Needling, Joint mobilization, Joint manipulation, Spinal manipulation, Spinal mobilization, Cryotherapy, and Moist heat   PLAN FOR NEXT SESSION: Cervical spine as well as cervical  position in regards to mid back pain  traction, self traction techniques, decompressive exercises, breathing exercises   11:46 AM, 11/25/23 Tabitha Ewings, DPT Physical Therapy with [facility]

## 2023-11-28 ENCOUNTER — Encounter: Payer: Self-pay | Admitting: Urgent Care

## 2023-11-28 ENCOUNTER — Ambulatory Visit (INDEPENDENT_AMBULATORY_CARE_PROVIDER_SITE_OTHER): Admitting: Urgent Care

## 2023-11-28 VITALS — BP 138/80 | HR 58 | Wt 167.7 lb

## 2023-11-28 DIAGNOSIS — K120 Recurrent oral aphthae: Secondary | ICD-10-CM | POA: Diagnosis not present

## 2023-11-28 MED ORDER — VALACYCLOVIR HCL 1 G PO TABS: 1000.0000 mg | ORAL_TABLET | Freq: Two times a day (BID) | ORAL | 0 refills | Status: DC

## 2023-11-28 NOTE — Progress Notes (Signed)
 Established Patient Office Visit  Subjective:  Patient ID: Keith Mckee, male    DOB: 08-17-1960  Age: 63 y.o. MRN: [id number]  Chief Complaint  Patient presents with   Rash    Pt has developed a rash on his lower lip a few days ago that has now spread to his top lip. The rash does itch and burn.    Pt presents for complaints of abnormality to external lips. Noted a "weird sensation like it was chapped" on the lower R lip a few days ago. States it has since spread to the upper and lower lip, worse on R but affects entire lip. Nothing on the gums or palate. No rash anywhere else on his body. States that using chapstick causes it to burn and itch. Wife has hx of cold sore, to date pt does not. No pain with swallowing or facial rash. Does not affect corners of mouth.  Rash    Patient Active Problem List   Diagnosis Date Noted   Dysphagia 09/26/2022   Hypertropia of right eye 06/12/2021   Cystoid macular edema of right eye 12/05/2020   Irregular astigmatism of right eye 01/26/2019   S/P eye surgery 01/26/2019   Monocular diplopia of right eye 01/14/2019   Pseudophakia of both eyes 08/04/2018   Age-related nuclear cataract of left eye 02/17/2018   Pseudophakia of right eye 02/17/2018   Glaucoma 12/24/2017   Health maintenance examination 12/12/2015   Idiopathic peripheral neuropathy 11/28/2015   Low back pain 11/28/2015   Lumbar facet arthropathy 11/28/2015   Neck pain 11/28/2015   Deviated nasal septum 10/24/2015   Gastroesophageal reflux disease 10/24/2015   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 04/05/2015   Cervical spinal stenosis 04/05/2015   Cervical spondylosis with radiculopathy 04/05/2015   Spondylosis 03/24/2015   Restless legs syndrome 09/27/2014   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   Primary open-angle glaucoma 12/21/2013   Abnormal cardiovascular stress test 10/21/2013   Dyspnea 06/04/2013   Disturbance of skin sensation 06/11/2012    Polyneuropathy in other diseases classified elsewhere (HCC) 06/11/2012   Cervical spondylosis without myelopathy 06/11/2012   Past Medical History:  Diagnosis Date   Anxiety    BPH (benign prostatic hypertrophy)    Cataract    artifiicial lenses in both eyes   Cervical spondylosis    with radiculopathy not responsive to conservative therapies:  [facility] 02/2015: ACDF surgery C5-C7 performed 04/05/15   Chronic pain syndrome    cervicalgia, heredetary PN, LBP   COVID-19 virus infection 11/2020   Depression    Difficulty controlling anger    + GAD with OCD tendencies + memory changes---consider neuropsych testing   DISH (diffuse idiopathic skeletal hyperostosis)    Dysphagia    Dysplastic nevus 10/2015   left mid back: Dr. Steen Eden following   GERD (gastroesophageal reflux disease)    +LPR (ENT 09/2022)   Glaucoma    History of adenomatous polyp of colon    IBS (irritable bowel syndrome)    +constipation   Microscopic colitis 06/2019   colonoscopy + 06/2019   Neuropathic pain    Peripheral neuropathy    Idiopathic, inherited per pt's neurologist, Dr. Tonuzi. Various labs normal 07/2016 except vitamin B6 was siginificantly elevated.  Allergist eval/allergy testing (including food) ALL NORMAL/NEG 07/2016.   Restless legs syndrome 09/27/2014   Throat irritation    chronic (since cervical spine surgery 2016).  Dr. Archer Kobs 2017->laryngoscopy normal, CT sinuses normal, barium swallow normal, trial of  protonix no help.    Past Surgical History:  Procedure Laterality Date   Ant cerv decompression/discectomy w/fusion C 5-7  04/05/15   C5-7 ACDF   CATARACT EXTRACTION Bilateral    COLONOSCOPY  06/29/2019   FOR EVAL OF UNEXPLAINED DIARRHEA-->normal, bx c/w microscopic colitis->uceris  and budesonide  rx'd by Dr. Leonia Raman 06/2019   COLONOSCOPY W/ POLYPECTOMY  08/2012; 05/23/16   2017, adenomatous polyp--recall 5 yrs.   EYE SURGERY Right 09/06/2020   TRABECULECTOMY Bilateral     for uncontrolled glaucoma    TRANSURETHRAL RESECTION OF PROSTATE  2008   due to enlarged prostate   VASECTOMY  2001      ROS: as noted in HPI  Objective:     BP 138/80   Pulse (!) 58   Wt 167 lb 11.2 oz (76.1 kg)   SpO2 100%   BMI 22.13 kg/m  BP Readings from Last 3 Encounters:  11/28/23 138/80  09/10/23 128/85  07/15/23 101/67   Wt Readings from Last 3 Encounters:  11/28/23 167 lb 11.2 oz (76.1 kg)  09/10/23 169 lb 12.8 oz (77 kg)  07/15/23 170 lb (77.1 kg)      Physical Exam Vitals and nursing note reviewed.  Constitutional:      General: He is not in acute distress.    Appearance: Normal appearance. He is normal weight. He is not ill-appearing, toxic-appearing or diaphoretic.  HENT:     Head: Normocephalic and atraumatic.     Mouth/Throat:     Lips: Pink. Lesions (numerous vesicles noted to external lip, several with ulcerations) present.     Mouth: Mucous membranes are moist. No oral lesions or angioedema.     Dentition: Abnormal dentition.     Pharynx: Oropharynx is clear. Uvula midline. No pharyngeal swelling, oropharyngeal exudate, posterior oropharyngeal erythema or uvula swelling.  Cardiovascular:     Rate and Rhythm: Bradycardia present.  Pulmonary:     Effort: Pulmonary effort is normal. No respiratory distress.  Lymphadenopathy:     Cervical: No cervical adenopathy.  Neurological:     Mental Status: He is alert and oriented to person, place, and time.  Psychiatric:        Mood and Affect: Mood normal.      No results found for any visits on 11/28/23.  Last CBC Lab Results  Component Value Date   WBC 6.1 03/14/2023   HGB 15.8 03/14/2023   HCT 45.6 03/14/2023   MCV 89.4 03/14/2023   MCH 31.0 03/14/2023   RDW 13.1 03/14/2023   PLT 146 (L) 03/14/2023   Last metabolic panel Lab Results  Component Value Date   GLUCOSE 72 03/14/2023   NA 141 03/14/2023   K 4.3 03/14/2023   CL 105 03/14/2023   CO2 30 03/14/2023   BUN 17 03/14/2023    CREATININE 1.01 03/14/2023   GFRNONAA >60 03/14/2023   CALCIUM 9.6 03/14/2023   PROT 6.7 03/14/2023   ALBUMIN 4.4 03/14/2023   LABGLOB 2.0 03/03/2023   BILITOT 0.7 03/14/2023   ALKPHOS 69 03/14/2023   AST 16 03/14/2023   ALT 14 03/14/2023   ANIONGAP 6 03/14/2023      The 10-year ASCVD risk score (Arnett DK, et al., 2019) is: 6.9%  Assessment & Plan:  Aphthous stomatitis -     valACYclovir HCl; Take 1 tablet (1,000 mg total) by mouth 2 (two) times daily.  Dispense: 14 tablet; Refill: 0  Tx for stomatitis, likely induced by recent stressors. Vaseline at night, topical abreva during the day,  BID valtrex x 7 days.   No follow-ups on file.   Mandy Second, PA

## 2023-11-28 NOTE — Patient Instructions (Signed)
 Please take valtrex twice daily x 7 days. Apply vaseline to the lips at night time. Purchase OTC abreva. Notify our office if not resolved within one week.

## 2023-12-02 ENCOUNTER — Encounter: Payer: Self-pay | Admitting: Urgent Care

## 2023-12-02 MED ORDER — CLOTRIMAZOLE-BETAMETHASONE 1-0.05 % EX CREA: 1.0000 | TOPICAL_CREAM | Freq: Two times a day (BID) | CUTANEOUS | 0 refills | Status: AC

## 2023-12-03 ENCOUNTER — Ambulatory Visit (INDEPENDENT_AMBULATORY_CARE_PROVIDER_SITE_OTHER): Admitting: Physical Therapy

## 2023-12-03 ENCOUNTER — Encounter: Payer: Self-pay | Admitting: Physical Therapy

## 2023-12-03 DIAGNOSIS — M6281 Muscle weakness (generalized): Secondary | ICD-10-CM

## 2023-12-03 DIAGNOSIS — M5459 Other low back pain: Secondary | ICD-10-CM | POA: Diagnosis not present

## 2023-12-03 NOTE — Therapy (Signed)
 OUTPATIENT PHYSICAL THERAPY THORACOLUMBAR TREATMENT   Patient Name: Keith Mckee MRN: [id number] DOB:10/14/1960, 63 y.o., male Today's Date: 12/03/2023  END OF SESSION:  PT End of Session - 12/03/23 0928     Visit Number 7    Number of Visits 16    Date for PT Re-Evaluation 01/07/24    PT Start Time 0930    PT Stop Time 1010    PT Time Calculation (min) 40 min    Activity Tolerance No increased pain;Patient tolerated treatment well    Behavior During Therapy [facility] for tasks assessed/performed              Past Medical History:  Diagnosis Date   Anxiety    BPH (benign prostatic hypertrophy)    Cataract    artifiicial lenses in both eyes   Cervical spondylosis    with radiculopathy not responsive to conservative therapies:  [facility] 02/2015: ACDF surgery C5-C7 performed 04/05/15   Chronic pain syndrome    cervicalgia, heredetary PN, LBP   COVID-19 virus infection 11/2020   Depression    Difficulty controlling anger    + GAD with OCD tendencies + memory changes---consider neuropsych testing   DISH (diffuse idiopathic skeletal hyperostosis)    Dysphagia    Dysplastic nevus 10/2015   left mid back: Dr. Steen Eden following   GERD (gastroesophageal reflux disease)    +LPR (ENT 09/2022)   Glaucoma    History of adenomatous polyp of colon    IBS (irritable bowel syndrome)    +constipation   Microscopic colitis 06/2019   colonoscopy + 06/2019   Neuropathic pain    Peripheral neuropathy    Idiopathic, inherited per pt's neurologist, Dr. Tonuzi. Various labs normal 07/2016 except vitamin B6 was siginificantly elevated.  Allergist eval/allergy testing (including food) ALL NORMAL/NEG 07/2016.   Restless legs syndrome 09/27/2014   Throat irritation    chronic (since cervical spine surgery 2016).  Dr. Archer Kobs 2017->laryngoscopy normal, CT sinuses normal, barium swallow normal, trial of protonix no help.    Past Surgical History:  Procedure Laterality Date   Ant  cerv decompression/discectomy w/fusion C 5-7  04/05/15   C5-7 ACDF   CATARACT EXTRACTION Bilateral    COLONOSCOPY  06/29/2019   FOR EVAL OF UNEXPLAINED DIARRHEA-->normal, bx c/w microscopic colitis->uceris  and budesonide  rx'd by Dr. Leonia Raman 06/2019   COLONOSCOPY W/ POLYPECTOMY  08/2012; 05/23/16   2017, adenomatous polyp--recall 5 yrs.   EYE SURGERY Right 09/06/2020   TRABECULECTOMY Bilateral    for uncontrolled glaucoma    TRANSURETHRAL RESECTION OF PROSTATE  2008   due to enlarged prostate   VASECTOMY  2001   Patient Active Problem List   Diagnosis Date Noted   Dysphagia 09/26/2022   Hypertropia of right eye 06/12/2021   Cystoid macular edema of right eye 12/05/2020   Irregular astigmatism of right eye 01/26/2019   S/P eye surgery 01/26/2019   Monocular diplopia of right eye 01/14/2019   Pseudophakia of both eyes 08/04/2018   Age-related nuclear cataract of left eye 02/17/2018   Pseudophakia of right eye 02/17/2018   Glaucoma 12/24/2017   Health maintenance examination 12/12/2015   Idiopathic peripheral neuropathy 11/28/2015   Low back pain 11/28/2015   Lumbar facet arthropathy 11/28/2015   Neck pain 11/28/2015   Deviated nasal septum 10/24/2015   Gastroesophageal reflux disease 10/24/2015   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 04/05/2015   Cervical spinal stenosis 04/05/2015   Cervical spondylosis with radiculopathy 04/05/2015   Spondylosis  03/24/2015   Restless legs syndrome 09/27/2014   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   Primary open-angle glaucoma 12/21/2013   Abnormal cardiovascular stress test 10/21/2013   Dyspnea 06/04/2013   Disturbance of skin sensation 06/11/2012   Polyneuropathy in other diseases classified elsewhere (HCC) 06/11/2012   Cervical spondylosis without myelopathy 06/11/2012    PCP: Mandy Second, PA  REFERRING PROVIDER: Mandy Second, PA  REFERRING DIAG: [phone] (ICD-10-CM) - Acute right-sided low back pain  with left-sided sciatica  Rationale for Evaluation and Treatment: Rehabilitation  THERAPY DIAG:  Other low back pain  Muscle weakness (generalized)  ONSET DATE: a while ago  SUBJECTIVE:                                                                                                                                                                                           SUBJECTIVE STATEMENT: 12/03/2023 States he has been doing the neck exercise on the ball. States he had a slight kink in his neck and it is a little tender. States rolling on the tennis ball is not as painful. States mid back pain comes and goes. States he took a walk he was focusing on his ribs expanding is getting pain.      Eval: States he has had this pain for a while. States [facility] to [facility] and his treatments didn't help States that pain was radiate to his left testicle.  Patient also has a chronic low back pain that radiates into his right leg causing his right leg to go numb after standing for any amount of time.  Reports the mornings are worse and he tosses and turns and cannot sleep.  Reports he historically was working out 5 days a week with hiking and strength training as well as some yoga exercises but has had to put some of this on hold with his recent biceps repair surgery.   PERTINENT HISTORY  Recent distal biceps repair  09/09/23, neuropathy, IBS, DISH, GERD, C5-7 fusion 04/08/15  PAIN:  Are you having pain? Yes: NPRS scale: 3/10 Pain location: low and mid back Pain description: constant ache  Aggravating factors: sitting, AM Relieving factors: standing and walking   PRECAUTIONS: None  RED FLAGS: None   WEIGHT BEARING RESTRICTIONS: No  FALLS:  Has patient fallen in last 6 months? 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  DIAGNOSTIC FINDINGS:  09/10/23 lumbar xray FINDINGS: There is  no evidence of lumbar spine fracture. Alignment is normal. Moderate degenerative disc disease is noted at L1-2, L2-3 and L5-S1. Mild degenerative disc disease is noted at L3-4 and L4-5 with anterior osteophyte formation.   IMPRESSION: Multilevel degenerative changes as noted above. No acute abnormality seen.    PATIENT SURVEYS:  Patient-specific activity functional scoring scheme (Point to one number):  0 represents "unable to perform." 10 represents "able to perform at prior level. 0 1 2 3 4 5 6 7 8 9  10 (Date and Score) Activity Initial  Activity Eval     Standing a couple minutes   7    Sleeping in AM   5    sitting 7    Additional Additional Total score = sum of the activity scores/number of activities Minimum detectable change (90%CI) for average score = 2 points Minimum detectable change (90%CI) for single activity score = 3 points PSFS developed by: [place] Spitz., & Binkley, J. (1995). Assessing disability and change on individual  patients: a report of a patient specific measure. Physiotherapy [place], [phone], [phone]. Reproduced with the permission of the authors  Score: 19/30=6.33   COGNITION: Overall cognitive status: Within functional limits for tasks assessed     SENSATION: Not tested    POSTURE: forward head and decreased lumbar lordosis  PALPATION: NA  LUMBAR ROM:   AROM eval  Flexion 75% limited  Extension 90% limited (felt good)  Right lateral flexion 75% limited * pain in left leg  Left lateral flexion 75% limited * pain in left leg  Right rotation   Left rotation    (Blank rows = not tested)    LE Measurements - will assess in future sessions Lower Extremity Right EVAL Left EVAL   A/PROM MMT A/PROM MMT  Hip Flexion      Hip Extension      Hip Abduction      Hip Adduction      Hip Internal rotation 10  30   Hip  External rotation 70  40   Knee Flexion      Knee Extension      Ankle Dorsiflexion      Ankle Plantarflexion      Ankle Inversion      Ankle Eversion       (Blank rows = not tested) * pain   LUMBAR SPECIAL TESTS:  Slump neg B Ely's test    TREATMENT DATE:                                                                                                                               12/03/2023  Therapeutic Exercise:  Review of HEP Supine:   neck chin minutes total on ball with neck extensions, then with scap protraction., book stretch - pain, laying on towel roll unilateral for rib mobilization  Prone:   Seated:  Standing  Neuromuscular Re-education  Manual Therapy:  STM to SCM, anterior scalenes,  medial glides of cervical spine grade 2 focused bilateral, STM to upper traps and levator, gentle cervical traction, UPS to ribs with rotation grade II/III  Therapeutic Activity:  Self Care: Trigger Point Dry Needling:  Modalities:      PATIENT EDUCATION:  Education details: on HEP Person educated: Patient Education method: Programmer, multimedia, Facilities manager, and Handouts Education comprehension: verbalized understanding   HOME EXERCISE PROGRAM: [id number] Self traction with bed sheets Boat pose, hip add iso Hip add iso with log roll Sitting posture with hips in neutral Laying on towel on half of spine  ASSESSMENT:  CLINICAL IMPRESSION: Continued to focus on neck. Tolerated session well. Continued mid back pain. Noted limited thoracic rotation with rolling over. Tolerated manual work well with reduced pain and improved mobility noted afterwards. Added self mobilization with towel to right right. Will continue with current POC as tolerated.    EVAL: Patient presents to physical therapy with complaints of chronic low back pain that radiates into the left leg into left scrotum and also into right leg.  Pain is worse in the morning as well with sitting and standing.  Movement and  walking tends to help with his pain.  Patient presents with limitations in range of motion and posture that is likely contributing to current presentation.  Patient greatly benefit from skilled PT to address physical impairments to return patient to optimal function.  OBJECTIVE IMPAIRMENTS: decreased activity tolerance, decreased ROM, decreased strength, postural dysfunction, and pain.   ACTIVITY LIMITATIONS: sitting and standing  PARTICIPATION LIMITATIONS: community activity  PERSONAL FACTORS: Fitness and 1-2 comorbidities: cervical fusion, right biceps repair are also affecting patient's functional outcome.   REHAB POTENTIAL: Good  CLINICAL DECISION MAKING: Stable/uncomplicated  EVALUATION COMPLEXITY: Low   GOALS: Goals reviewed with patient? yes  SHORT TERM GOALS: Target date: 11/26/2023   Patient will be independent in self management strategies to improve quality of life and functional outcomes. Baseline: New Program Goal status: INITIAL  2.  Patient will report at least 50% improvement in overall symptoms and/or function to demonstrate improved functional mobility Baseline: 0% better Goal status: INITIAL  3.  Patient will be able to stand for 10 minutes at a time without right leg going numb Baseline: Unable Goal status: INITIAL      LONG TERM GOALS: Target date: 01/07/2024    Patient will report at least 75% improvement in overall symptoms and/or function to demonstrate improved functional mobility Baseline: 0% better Goal status: INITIAL  2.  Patient will score at least 2 points higher on PSFS average to demonstrate change in overall function. Baseline: see above Goal status: INITIAL  3.  Patient will be able to report sleeping in the morning without waking up due to pain to improve sleep quality Baseline: Unable painful Goal status: INITIAL   PLAN:  PT FREQUENCY: 1-2x/week  PT DURATION: 12 weeks  PLANNED INTERVENTIONS: 97110-Therapeutic exercises,  97530- Therapeutic activity, 97112- Neuromuscular re-education, 97535- Self Care, 78295- Manual therapy, [phone]- Gait training, [phone]- Orthotic Fit/training, [phone]- Canalith repositioning, [id number]- Aquatic Therapy, [id number]- Electrical stimulation (unattended), [phone]- Ionotophoresis 4mg /ml Dexamethasone, Patient/Family education, Balance training, Stair training, Taping, Dry Needling, Joint mobilization, Joint manipulation, Spinal manipulation, Spinal mobilization, Cryotherapy, and Moist heat   PLAN FOR NEXT SESSION: Cervical spine as well as cervical position in regards to mid back pain  traction, self traction techniques, decompressive exercises, breathing exercises   10:45 AM, 12/03/23 Tabitha Ewings, DPT Physical Therapy with [place]

## 2023-12-09 ENCOUNTER — Encounter: Payer: Self-pay | Admitting: Physical Therapy

## 2023-12-09 ENCOUNTER — Ambulatory Visit (INDEPENDENT_AMBULATORY_CARE_PROVIDER_SITE_OTHER): Admitting: Physical Therapy

## 2023-12-09 DIAGNOSIS — M5459 Other low back pain: Secondary | ICD-10-CM | POA: Diagnosis not present

## 2023-12-09 DIAGNOSIS — M6281 Muscle weakness (generalized): Secondary | ICD-10-CM | POA: Diagnosis not present

## 2023-12-09 NOTE — Therapy (Signed)
 OUTPATIENT PHYSICAL THERAPY THORACOLUMBAR TREATMENT   Patient Name: Keith Mckee MRN: [id number] DOB:1960/06/28, 63 y.o., male Today's Date: 12/09/2023  END OF SESSION:  PT End of Session - 12/09/23 0931     Visit Number 8    Number of Visits 16    Date for PT Re-Evaluation 01/07/24    PT Start Time 0932    PT Stop Time 1010    PT Time Calculation (min) 38 min    Activity Tolerance No increased pain;Patient tolerated treatment well    Behavior During Therapy [facility] for tasks assessed/performed           Past Medical History:  Diagnosis Date   Anxiety    BPH (benign prostatic hypertrophy)    Cataract    artifiicial lenses in both eyes   Cervical spondylosis    with radiculopathy not responsive to conservative therapies:  [facility] 02/[facility]: ACDF surgery C5-C7 performed 04/05/15   Chronic pain syndrome    cervicalgia, heredetary PN, LBP   COVID-19 virus infection 11/2020   Depression    Difficulty controlling anger    + GAD with OCD tendencies + memory changes---consider neuropsych testing   DISH (diffuse idiopathic skeletal hyperostosis)    Dysphagia    Dysplastic nevus 10/2015   left mid back: Dr. Steen Eden following   GERD (gastroesophageal reflux disease)    +LPR (ENT 09/2022)   Glaucoma    History of adenomatous polyp of colon    IBS (irritable bowel syndrome)    +constipation   Microscopic colitis 06/2019   colonoscopy + 06/2019   Neuropathic pain    Peripheral neuropathy    Idiopathic, inherited per pt's neurologist, Dr. Tonuzi. Various labs normal 07/2016 except vitamin B6 was siginificantly elevated.  Allergist eval/allergy testing (including food) ALL NORMAL/NEG 07/2016.   Restless legs syndrome 09/27/2014   Throat irritation    chronic (since cervical spine surgery 2016).  Dr. Archer Kobs 2017->laryngoscopy normal, CT sinuses normal, barium swallow normal, trial of protonix no help.    Past Surgical History:  Procedure Laterality Date   Ant cerv  decompression/discectomy w/fusion C 5-7  04/05/15   C5-7 ACDF   CATARACT EXTRACTION Bilateral    COLONOSCOPY  06/29/2019   FOR EVAL OF UNEXPLAINED DIARRHEA-->normal, bx c/w microscopic colitis->uceris  and budesonide  rx'd by Dr. Leonia Raman 06/2019   COLONOSCOPY W/ POLYPECTOMY  08/2012; 05/23/16   2017, adenomatous polyp--recall 5 yrs.   EYE SURGERY Right 09/06/2020   TRABECULECTOMY Bilateral    for uncontrolled glaucoma    TRANSURETHRAL RESECTION OF PROSTATE  2008   due to enlarged prostate   VASECTOMY  2001   Patient Active Problem List   Diagnosis Date Noted   Dysphagia 09/26/2022   Hypertropia of right eye 06/12/2021   Cystoid macular edema of right eye 12/05/2020   Irregular astigmatism of right eye 01/26/2019   S/P eye surgery 01/26/2019   Monocular diplopia of right eye 01/14/2019   Pseudophakia of both eyes 08/04/2018   Age-related nuclear cataract of left eye 02/17/2018   Pseudophakia of right eye 02/17/2018   Glaucoma 12/24/2017   Health maintenance examination 12/12/2015   Idiopathic peripheral neuropathy 11/28/2015   Low back pain 11/28/2015   Lumbar facet arthropathy 11/28/2015   Neck pain 11/28/2015   Deviated nasal septum 10/24/2015   Gastroesophageal reflux disease 10/24/2015   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 04/05/2015   Cervical spinal stenosis 04/05/2015   Cervical spondylosis with radiculopathy 04/05/2015   Spondylosis 03/24/2015  Restless legs syndrome 09/27/2014   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   Primary open-angle glaucoma 12/21/2013   Abnormal cardiovascular stress test 10/21/2013   Dyspnea 06/04/2013   Disturbance of skin sensation 06/11/2012   Polyneuropathy in other diseases classified elsewhere (HCC) 06/11/2012   Cervical spondylosis without myelopathy 06/11/2012    PCP: Mandy Second, PA  REFERRING PROVIDER: Mandy Second, PA  REFERRING DIAG: [phone] (ICD-10-CM) - Acute right-sided low back pain with  left-sided sciatica  Rationale for Evaluation and Treatment: Rehabilitation  THERAPY DIAG:  Other low back pain  Muscle weakness (generalized)  ONSET DATE: a while ago  SUBJECTIVE:                                                                                                                                                                                           SUBJECTIVE STATEMENT: 12/09/2023 States he continues to have sharp intense of pain in his back. States that he hasn't done too much the last couple of days. Reduced neck tension except on the right side. States that the low back was bothering him yesterday and this morning, drove a little bit more. States he has not radicular symptoms. In regards to his low back he feels 50% better but in general whole body 20% better (neck, mid back and right LE).    Eval: States he has had this pain for a while. States [facility] to [facility] and his treatments didn't help States that pain was radiate to his left testicle.  Patient also has a chronic low back pain that radiates into his right leg causing his right leg to go numb after standing for any amount of time.  Reports the mornings are worse and he tosses and turns and cannot sleep.  Reports he historically was working out 5 days a week with hiking and strength training as well as some yoga exercises but has had to put some of this on hold with his recent biceps repair surgery.   PERTINENT HISTORY  Recent distal biceps repair  09/09/23, neuropathy, IBS, DISH, GERD, C5-7 fusion 04/08/15  PAIN:  Are you having pain? Yes: NPRS scale: 2/10 Pain location: low and mid back Pain description: constant ache  Aggravating factors: sitting, AM Relieving factors: standing and walking   PRECAUTIONS: None  RED FLAGS: None   WEIGHT BEARING RESTRICTIONS: No  FALLS:  Has patient fallen in last 6 months? 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  DIAGNOSTIC FINDINGS:  09/10/23 lumbar xray FINDINGS: There is no evidence of lumbar spine fracture. Alignment is normal. Moderate degenerative disc disease is noted at L1-2, L2-3 and L5-S1. Mild degenerative disc disease is noted at L3-4 and L4-5 with anterior osteophyte formation.   IMPRESSION: Multilevel degenerative changes as noted above. No acute abnormality seen.    PATIENT SURVEYS:  Patient-specific activity functional scoring scheme (Point to one number):  0 represents "unable to perform." 10 represents "able to perform at prior level. 0 1 2 3 4 5 6 7 8 9  10 (Date and Score) Activity Initial  Activity Eval  6/17   Standing a couple minutes   7  7  Sleeping in AM   5  8  sitting 7 9   Additional Additional Total score = sum of the activity scores/number of activities Minimum detectable change (90%CI) for average score = 2 points Minimum detectable change (90%CI) for single activity score = 3 points PSFS developed by: [place] Spitz., & Binkley, J. (1995). Assessing disability and change on individual  patients: a report of a patient specific measure. Physiotherapy [place], [phone], [phone]. Reproduced with the permission of the authors  Score: EVAL: 19/30=6.33,  6/17 24/3 =8   COGNITION: Overall cognitive status: Within functional limits for tasks assessed     SENSATION: Not tested    POSTURE: forward head and decreased lumbar lordosis  PALPATION: NA  LUMBAR ROM:   AROM 6/17  Flexion 75% limited  Extension 90% limited (felt good)  Right lateral flexion 60% limited    Left lateral flexion 75% limited * pain in right low back  Right rotation   Left rotation    (Blank rows = not tested)    LE Measurements - will assess in future sessions Lower Extremity Right 6/17 Left 6/17    A/PROM MMT A/PROM MMT  Hip Flexion      Hip Extension      Hip Abduction      Hip Adduction      Hip Internal rotation 30  40   Hip External rotation 70  60   Knee Flexion      Knee Extension      Ankle Dorsiflexion      Ankle Plantarflexion      Ankle Inversion      Ankle Eversion       (Blank rows = not tested) * pain       TREATMENT DATE:                                                                                                                               12/09/2023  Therapeutic Exercise:  Review of HEP, objective measures updated, reviewed HEP Supine:  Prone:   Seated:  Standing: self mobilization with theracane- PT guided - 8 minutes to B QL and paraspinals Neuromuscular Re-education  Manual Therapy:  STM to B thoracic  paraspinals and QL, UPA to ribs B grade II/III  Therapeutic Activity:  Self Care: Trigger Point Dry Needling:  Modalities:      PATIENT EDUCATION:  Education details: on HEP, on progress made and plan moving forward. Person educated: Patient Education method: Explanation, Demonstration, and Handouts Education comprehension: verbalized understanding   HOME EXERCISE PROGRAM: [id number] Self traction with bed sheets Boat pose, hip add iso Hip add iso with log roll Sitting posture with hips in neutral Laying on towel on half of spine  ASSESSMENT:  CLINICAL IMPRESSION: Overall patient is doing better in regards to left LE and low back pain. Has some continued back pain after traveling but not as bad and not into leg. Improvement in overall symptoms and ROM. Discussed current presentation as well as plan moving forward. Will continue to address mid back symptoms as tolerated.    EVAL: Patient presents to physical therapy with complaints of chronic low back pain that radiates into the left leg into left scrotum and also into right leg.  Pain is worse in the morning as well with sitting and standing.  Movement and walking tends to help with  his pain.  Patient presents with limitations in range of motion and posture that is likely contributing to current presentation.  Patient greatly benefit from skilled PT to address physical impairments to return patient to optimal function.  OBJECTIVE IMPAIRMENTS: decreased activity tolerance, decreased ROM, decreased strength, postural dysfunction, and pain.   ACTIVITY LIMITATIONS: sitting and standing  PARTICIPATION LIMITATIONS: community activity  PERSONAL FACTORS: Fitness and 1-2 comorbidities: cervical fusion, right biceps repair are also affecting patient's functional outcome.   REHAB POTENTIAL: Good  CLINICAL DECISION MAKING: Stable/uncomplicated  EVALUATION COMPLEXITY: Low   GOALS: Goals reviewed with patient? yes  SHORT TERM GOALS: Target date: 11/26/2023   Patient will be independent in self management strategies to improve quality of life and functional outcomes. Baseline: New Program Goal status: MET  2.  Patient will report at least 50% improvement in overall symptoms and/or function to demonstrate improved functional mobility Baseline: 0% better Goal status: MET  3.  Patient will be able to stand for 10 minutes at a time without right leg going numb Baseline: Unable Goal status: PROGRESSING/NOT MET      LONG TERM GOALS: Target date: 01/07/2024    Patient will report at least 75% improvement in overall symptoms and/or function to demonstrate improved functional mobility Baseline: 0% better Goal status: PROGRESSING  2.  Patient will score at least 2 points higher on PSFS average to demonstrate change in overall function. Baseline: see above Goal status: PROGRESSING  3.  Patient will be able to report sleeping in the morning without waking up due to pain to improve sleep quality Baseline: Unable painful Goal status: MET   PLAN:  PT FREQUENCY: 1-2x/week  PT DURATION: 12 weeks  PLANNED INTERVENTIONS: 97110-Therapeutic exercises, 97530- Therapeutic  activity, [id number]- Neuromuscular re-education, 97535- Self Care, [id number]- Manual therapy, [phone]- Gait training, [phone]- Orthotic Fit/training, [phone]- Canalith repositioning, [id number]- Aquatic Therapy, [id number]- Electrical stimulation (unattended), [phone]- Ionotophoresis 4mg /ml Dexamethasone, Patient/Family education, Balance training, Stair training, Taping, Dry Needling, Joint mobilization, Joint manipulation, Spinal manipulation, Spinal mobilization, Cryotherapy, and Moist heat   PLAN FOR NEXT SESSION: focus on thoracic mobilization . Cervical spine as well as cervical position in regards to mid back pain  traction, self traction techniques, decompressive exercises, breathing exercises   10:16 AM, 12/09/23 Tabitha Ewings, DPT Physical Therapy with [place]

## 2023-12-17 ENCOUNTER — Ambulatory Visit: Payer: Self-pay | Admitting: Family Medicine

## 2023-12-22 ENCOUNTER — Encounter: Payer: Self-pay | Admitting: Family Medicine

## 2023-12-22 ENCOUNTER — Ambulatory Visit (INDEPENDENT_AMBULATORY_CARE_PROVIDER_SITE_OTHER): Admitting: Family Medicine

## 2023-12-22 VITALS — BP 126/69 | HR 51 | Temp 98.0°F | Ht 73.0 in | Wt 167.4 lb

## 2023-12-22 DIAGNOSIS — F411 Generalized anxiety disorder: Secondary | ICD-10-CM

## 2023-12-22 DIAGNOSIS — F5104 Psychophysiologic insomnia: Secondary | ICD-10-CM

## 2023-12-22 DIAGNOSIS — R110 Nausea: Secondary | ICD-10-CM | POA: Diagnosis not present

## 2023-12-22 DIAGNOSIS — G894 Chronic pain syndrome: Secondary | ICD-10-CM

## 2023-12-22 DIAGNOSIS — R454 Irritability and anger: Secondary | ICD-10-CM

## 2023-12-22 NOTE — Progress Notes (Signed)
 OFFICE VISIT  12/22/2023  CC:  Chief Complaint  Patient presents with   Abdominal Discomfort    Nausea; denies vomiting or diarrhea, taking Famotidine 20mg  bid    Patient is a 63 y.o. male who presents for stomach problems. I last saw him 07/15/23. A/P as of that visit: #1 anxiety, difficulty controlling anger, insomnia.   He does get some benefit from 30 mg of BuSpar  twice a day and clonazepam  0.5 mg nightly. Discussed the general difficulty in getting a clear diagnosis of ADD in the context of his overall chronic psychologic symptoms. Mentioned the possibility of referral to [facility] versus trial of stimulant medication. He chose the latter. We will do trial of Adderall XR 10 mg every morning. Therapeutic expectations and side effect profile of medication discussed today.  Patient's questions answered.   #2 chronic pain syndrome.  History of low back pain plus he has idiopathic peripheral neuropathy.  His neurologist prescribes gabapentin . He takes occasional Vicodin 7.5/325, which I prescribed for him.  He did not need a new prescription today.   #3 chronic nausea. Unknown etiology. History of fairly extensive workup unrevealing. He will get back in touch with his gastroenterologist to see if there is a next step. In the meantime he will continue Pepcid 10 mg twice a day.   #4 preventative health care: Vaccines: all UTD. Labs:  PSA, lipid-->he'll return for lab visit when fasting  CBC and CMET normal 03/14/23. Prostate ca screening: PSA today Colon ca screening: recall 2031  HPI: Still struggling with chronic nausea.  Waxes and wanes in intensity.  No clear trigger. Recently was on vacation, under no stress at all and got a severe case of nausea. He does not have abdominal pain.  No vomiting.  No fever.  No diarrhea or blood in stool. He takes Pepcid 20 mg twice a day lately. He has been on doses as low as 10 mg a day when his symptoms were not as  bad.  He does not take NSAIDs. He has been very hesitant to use PPI due to fear of long-term side effects/nutritional concerns. He is unable to get in to see his gastroenterologist until 01/23/2024. He read an article about vitamin B12 possibly causing chronic nausea so he wanted to ask about that today.  He only occasionally uses his Vicodin for peripheral neuropathic pain, neck pain, and back pain. Anxiety is well-controlled on BuSpar  30 mg twice a day and as needed use of clonazepam . PMP AWARE reviewed today: most recent rx for clonazepam  was filled 11/05/2023, # 60, rx by me.  Most recent Vicodin 7.5 mg prescription was filled 11/05/2023, #30, prescription by me. No red flags.  Past Medical History:  Diagnosis Date   Anxiety    BPH (benign prostatic hypertrophy)    Cataract    artifiicial lenses in both eyes   Cervical spondylosis    with radiculopathy not responsive to conservative therapies:  [facility] 02/2015: ACDF surgery C5-C7 performed 04/05/15   Chronic pain syndrome    cervicalgia, heredetary PN, LBP   COVID-19 virus infection 11/2020   Depression    Difficulty controlling anger    + GAD with OCD tendencies + memory changes---consider neuropsych testing   DISH (diffuse idiopathic skeletal hyperostosis)    Dysphagia    Dysplastic nevus 10/2015   left mid back: Dr. Livingston following   GERD (gastroesophageal reflux disease)    +LPR (ENT 09/2022)   Glaucoma    History of  adenomatous polyp of colon    IBS (irritable bowel syndrome)    +constipation   Microscopic colitis 06/2019   colonoscopy + 06/2019   Neuropathic pain    Peripheral neuropathy    Idiopathic, inherited per pt's neurologist, Dr. Tonuzi. Various labs normal 07/2016 except vitamin B6 was siginificantly elevated.  Allergist eval/allergy testing (including food) ALL NORMAL/NEG 07/2016.   Restless legs syndrome 09/27/2014   Throat irritation    chronic (since cervical spine surgery 2016).  Dr.  Arlana 2017->laryngoscopy normal, CT sinuses normal, barium swallow normal, trial of protonix no help.     Past Surgical History:  Procedure Laterality Date   Ant cerv decompression/discectomy w/fusion C 5-7  04/05/15   C5-7 ACDF   CATARACT EXTRACTION Bilateral    COLONOSCOPY  06/29/2019   FOR EVAL OF UNEXPLAINED DIARRHEA-->normal, bx c/w microscopic colitis->uceris  and budesonide  rx'd by Dr. Shila 06/2019   COLONOSCOPY W/ POLYPECTOMY  08/2012; 05/23/16   2017, adenomatous polyp--recall 5 yrs.   EYE SURGERY Right 09/06/2020   TRABECULECTOMY Bilateral    for uncontrolled glaucoma    TRANSURETHRAL RESECTION OF PROSTATE  2008   due to enlarged prostate   VASECTOMY  2001    Outpatient Medications Prior to Visit  Medication Sig Dispense Refill   Alpha-Lipoic Acid 300 MG CAPS Take 1 capsule by mouth 2 (two) times daily.     busPIRone  (BUSPAR ) 15 MG tablet TAKE 2 TABLETS BY MOUTH TWICE A DAY 120 tablet 2   clonazePAM  (KLONOPIN ) 0.5 MG tablet Take 1 tablet (0.5 mg total) by mouth 2 (two) times daily as needed for anxiety. 120 tablet 0   dorzolamide-timolol (COSOPT) 22.3-6.8 MG/ML ophthalmic solution Place 1 drop into the right eye 2 (two) times daily.     famotidine (PEPCID) 10 MG tablet Take 10 mg by mouth 2 (two) times daily.     gabapentin  (NEURONTIN ) 600 MG tablet Take 600 mg by mouth. Take 2 tablets in the morning & 2 in the evening     HYDROcodone -acetaminophen  (NORCO) 7.5-325 MG tablet Take 1 tablet by mouth every 6 (six) hours as needed for moderate pain (pain score 4-6). 30 tablet 0   latanoprost (XALATAN) 0.005 % ophthalmic solution Place 1 drop into both eyes at bedtime.     metaxalone  (SKELAXIN ) 800 MG tablet Take 1 tablet (800 mg total) by mouth 3 (three) times daily as needed for muscle spasms (back pain). 30 tablet 0   Omega-3 Fatty Acids (OMEGA-3 FISH OIL) 300 MG CAPS Take 1 capsule by mouth daily.     polyethylene glycol powder (GLYCOLAX/MIRALAX) 17 GM/SCOOP powder Take by  mouth.     sildenafil  (REVATIO ) 20 MG tablet 1-5 tabs po qd prn intercourse 30 tablet 11   valACYclovir  (VALTREX ) 1000 MG tablet Take 1 tablet (1,000 mg total) by mouth 2 (two) times daily. 14 tablet 0   amphetamine -dextroamphetamine (ADDERALL XR) 10 MG 24 hr capsule Take 1 capsule (10 mg total) by mouth daily. (Patient not taking: Reported on 12/22/2023) 30 capsule 0   No facility-administered medications prior to visit.    No Known Allergies  Review of Systems  As per HPI  PE:    12/22/2023    3:54 PM 11/28/2023    1:33 PM 09/10/2023   11:37 AM  Vitals with BMI  Height 6' 1    Weight 167 lbs 6 oz 167 lbs 11 oz 169 lbs 13 oz  BMI 22.09    Systolic 126 138 871  Diastolic 69 80 85  Pulse 51 58 55   Physical Exam  Gen: Alert, well appearing.  Patient is oriented to person, place, time, and situation. AFFECT: pleasant, lucid thought and speech. No further exam today  LABS:  Last CBC Lab Results  Component Value Date   WBC 6.1 03/14/2023   HGB 15.8 03/14/2023   HCT 45.6 03/14/2023   MCV 89.4 03/14/2023   MCH 31.0 03/14/2023   RDW 13.1 03/14/2023   PLT 146 (L) 03/14/2023   Last metabolic panel Lab Results  Component Value Date   GLUCOSE 72 03/14/2023   NA 141 03/14/2023   K 4.3 03/14/2023   CL 105 03/14/2023   CO2 30 03/14/2023   BUN 17 03/14/2023   CREATININE 1.01 03/14/2023   GFRNONAA >60 03/14/2023   CALCIUM 9.6 03/14/2023   PROT 6.7 03/14/2023   ALBUMIN 4.4 03/14/2023   LABGLOB 2.0 03/03/2023   BILITOT 0.7 03/14/2023   ALKPHOS 69 03/14/2023   AST 16 03/14/2023   ALT 14 03/14/2023   ANIONGAP 6 03/14/2023   Lab Results  Component Value Date   LIPASE 43 03/20/2023   Lab Results  Component Value Date   VITAMINB12 668 08/06/2016   IMPRESSION AND PLAN:  #1 chronic nausea. Unknown etiology. History of fairly extensive workup-->unrevealing (blood, ultrasound, EGD). Historically has responded to H2 blocker but not nearly as well lately.  Antinausea  medication has not worked. I encouraged him to try over-the-counter omeprazole 20 mg in the morning and continue Pepcid 20 mg every evening as needed.  Checking baseline iron and vitamin B12 levels today. He has an appointment with his gastroenterologist on 01/23/2024.  2 chronic pain syndrome.  History of low back pain plus he has idiopathic peripheral neuropathy.  His neurologist prescribes gabapentin . He takes occasional Vicodin 7.5/325, which I prescribed for him.  He did not need a new prescription today.  3 anxiety, difficulty controlling anger, insomnia.   He does get some benefit from 30 mg of BuSpar  twice a day and clonazepam  0.5 mg nightly. We had some question of ADD in the past and a trial of Adderall XR 10 mg in the morning did not help so he stopped it after approximately 1 week. No medication changes today. No new prescriptions needed today.  An After Visit Summary was printed and given to the patient.  FOLLOW UP: Return if symptoms worsen or fail to improve.  Signed:  Gerlene Hockey, MD           12/22/2023

## 2023-12-23 ENCOUNTER — Ambulatory Visit: Payer: Self-pay | Admitting: Family Medicine

## 2023-12-23 LAB — IRON,TIBC AND FERRITIN PANEL
%SAT: 30 % (ref 20–48)
Ferritin: 66 ng/mL (ref 24–380)
Iron: 95 ug/dL (ref 50–180)
TIBC: 318 ug/dL (ref 250–425)

## 2023-12-23 LAB — VITAMIN B12: Vitamin B-12: 298 pg/mL (ref 200–1100)

## 2023-12-29 ENCOUNTER — Ambulatory Visit (INDEPENDENT_AMBULATORY_CARE_PROVIDER_SITE_OTHER): Admitting: Physical Therapy

## 2023-12-29 ENCOUNTER — Encounter: Payer: Self-pay | Admitting: Physical Therapy

## 2023-12-29 DIAGNOSIS — M5459 Other low back pain: Secondary | ICD-10-CM

## 2023-12-29 DIAGNOSIS — M6281 Muscle weakness (generalized): Secondary | ICD-10-CM | POA: Diagnosis not present

## 2023-12-29 NOTE — Therapy (Signed)
 OUTPATIENT PHYSICAL THERAPY THORACOLUMBAR TREATMENT   Patient Name: Derold Dorsch MRN: [id number] DOB:May 03, 1961, 63 y.o., male Today's Date: 12/29/2023  END OF SESSION:  PT End of Session - 12/29/23 0929     Visit Number 9    Number of Visits 16    Date for PT Re-Evaluation 01/07/24    PT Start Time 0935    PT Stop Time 1016    PT Time Calculation (min) 41 min    Activity Tolerance No increased pain;Patient tolerated treatment well    Behavior During Therapy [facility] for tasks assessed/performed           Past Medical History:  Diagnosis Date   Anxiety    BPH (benign prostatic hypertrophy)    Cataract    artifiicial lenses in both eyes   Cervical spondylosis    with radiculopathy not responsive to conservative therapies:  [facility] 02/2015: ACDF surgery C5-C7 performed 04/05/15   Chronic pain syndrome    cervicalgia, heredetary PN, LBP   COVID-19 virus infection 11/2020   Depression    Difficulty controlling anger    + GAD with OCD tendencies + memory changes---consider neuropsych testing   DISH (diffuse idiopathic skeletal hyperostosis)    Dysphagia    Dysplastic nevus 10/2015   left mid back: Dr. Livingston following   GERD (gastroesophageal reflux disease)    +LPR (ENT 09/2022)   Glaucoma    History of adenomatous polyp of colon    IBS (irritable bowel syndrome)    +constipation   Microscopic colitis 06/2019   colonoscopy + 06/2019   Neuropathic pain    Peripheral neuropathy    Idiopathic, inherited per pt's neurologist, Dr. Tonuzi. Various labs normal 07/2016 except vitamin B6 was siginificantly elevated.  Allergist eval/allergy testing (including food) ALL NORMAL/NEG 07/2016.   Restless legs syndrome 09/27/2014   Throat irritation    chronic (since cervical spine surgery 2016).  Dr. Arlana 2017->laryngoscopy normal, CT sinuses normal, barium swallow normal, trial of protonix no help.    Past Surgical History:  Procedure Laterality Date   Ant cerv  decompression/discectomy w/fusion C 5-7  04/05/2015   C5-7 ACDF   CATARACT EXTRACTION Bilateral    COLONOSCOPY  06/29/2019   FOR EVAL OF UNEXPLAINED DIARRHEA-->normal, bx c/w microscopic colitis->uceris  and budesonide  rx'd by Dr. Shila 06/2019   COLONOSCOPY W/ POLYPECTOMY  08/2012; 05/23/16   2017, adenomatous polyp--recall 5 yrs.   ESOPHAGOGASTRODUODENOSCOPY     05/2023.  Some gastritis was seen.  No stricture but dilation was done anyway.  Biopsies were benign.  No H. pylori.  No intraepithelial eosinophils.   EYE SURGERY Right 09/06/2020   TRABECULECTOMY Bilateral    for uncontrolled glaucoma    TRANSURETHRAL RESECTION OF PROSTATE  2008   due to enlarged prostate   VASECTOMY  2001   Patient Active Problem List   Diagnosis Date Noted   Dysphagia 09/26/2022   Hypertropia of right eye 06/12/2021   Cystoid macular edema of right eye 12/05/2020   Irregular astigmatism of right eye 01/26/2019   S/P eye surgery 01/26/2019   Monocular diplopia of right eye 01/14/2019   Pseudophakia of both eyes 08/04/2018   Age-related nuclear cataract of left eye 02/17/2018   Pseudophakia of right eye 02/17/2018   Glaucoma 12/24/2017   Health maintenance examination 12/12/2015   Idiopathic peripheral neuropathy 11/28/2015   Low back pain 11/28/2015   Lumbar facet arthropathy 11/28/2015   Neck pain 11/28/2015   Deviated nasal septum 10/24/2015   Gastroesophageal  reflux disease 10/24/2015   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 04/05/2015   Cervical spinal stenosis 04/05/2015   Cervical spondylosis with radiculopathy 04/05/2015   Spondylosis 03/24/2015   Restless legs syndrome 09/27/2014   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   Primary open-angle glaucoma 12/21/2013   Abnormal cardiovascular stress test 10/21/2013   Dyspnea 06/04/2013   Disturbance of skin sensation 06/11/2012   Polyneuropathy in other diseases classified elsewhere (HCC) 06/11/2012   Cervical  spondylosis without myelopathy 06/11/2012    PCP: Lowella Benton CROME, PA  REFERRING PROVIDER: Lowella Benton CROME, PA  REFERRING DIAG: [phone] (ICD-10-CM) - Acute right-sided low back pain with left-sided sciatica  Rationale for Evaluation and Treatment: Rehabilitation  THERAPY DIAG:  Other low back pain  Muscle weakness (generalized)  ONSET DATE: a while ago  SUBJECTIVE:                                                                                                                                                                                           SUBJECTIVE STATEMENT: 12/29/2023 States that his middle back is better. He is not getting any sharp pains with getting up off the floor or with rotation. States his back is still very tender to touch with the theracane.   States his lower back is bothering him a little bit.     Eval: States he has had this pain for a while. States [facility] to [facility] and his treatments didn't help States that pain was radiate to his left testicle.  Patient also has a chronic low back pain that radiates into his right leg causing his right leg to go numb after standing for any amount of time.  Reports the mornings are worse and he tosses and turns and cannot sleep.  Reports he historically was working out 5 days a week with hiking and strength training as well as some yoga exercises but has had to put some of this on hold with his recent biceps repair surgery.   PERTINENT HISTORY  Recent distal biceps repair  09/09/23, neuropathy, IBS, DISH, GERD, C5-7 fusion 04/08/15  PAIN:  Are you having pain? Yes: NPRS scale: 1/10 Pain location: low and mid back Pain description: constant ache  Aggravating factors: sitting, AM Relieving factors: standing and walking   PRECAUTIONS: None  RED FLAGS: None   WEIGHT BEARING RESTRICTIONS: No  FALLS:  Has patient fallen in last 6 months? 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  DIAGNOSTIC FINDINGS:  09/10/23 lumbar xray FINDINGS: There is no evidence of lumbar spine fracture. Alignment is normal. Moderate degenerative disc disease is noted at L1-2, L2-3 and L5-S1. Mild degenerative disc disease is noted at L3-4 and L4-5 with anterior osteophyte formation.   IMPRESSION: Multilevel degenerative changes as noted above. No acute abnormality seen.    PATIENT SURVEYS:  Patient-specific activity functional scoring scheme (Point to one number):  0 represents "unable to perform." 10 represents "able to perform at prior level. 0 1 2 3 4 5 6 7 8 9  10 (Date and Score) Activity Initial  Activity Eval  6/17   Standing a couple minutes   7  7  Sleeping in AM   5  8  sitting 7 9   Additional Additional Total score = sum of the activity scores/number of activities Minimum detectable change (90%CI) for average score = 2 points Minimum detectable change (90%CI) for single activity score = 3 points PSFS developed by: Rosalee MYRTIS Marvis KYM Charlet CHRISTELLA., & Binkley, J. (1995). Assessing disability and change on individual  patients: a report of a patient specific measure. Physiotherapy [place], 47, [phone]. Reproduced with the permission of the authors  Score: EVAL: 19/30=6.33,  6/17 24/3 =8   COGNITION: Overall cognitive status: Within functional limits for tasks assessed     SENSATION: Not tested    POSTURE: forward head and decreased lumbar lordosis  PALPATION: NA  LUMBAR ROM:   AROM 6/17  Flexion 75% limited  Extension 90% limited (felt good)  Right lateral flexion 60% limited    Left lateral flexion 75% limited * pain in right low back  Right rotation   Left rotation    (Blank rows = not tested)    LE Measurements - will assess in future sessions Lower Extremity  Right 6/17 Left 6/17   A/PROM MMT A/PROM MMT  Hip Flexion      Hip Extension      Hip Abduction      Hip Adduction      Hip Internal rotation 30  40   Hip External rotation 70  60   Knee Flexion      Knee Extension      Ankle Dorsiflexion      Ankle Plantarflexion      Ankle Inversion      Ankle Eversion       (Blank rows = not tested) * pain       TREATMENT DATE:                                                                                                                               12/29/2023  Therapeutic Exercise:  Review of HEP,  and POC Supine:  Prone:   Seated:  Standing: self mobilization review , palof press with and without rotation. Blue band- reviewed other exercises as well as posture - tends to be  in left SB and right ROT  Neuromuscular Re-education  Manual Therapy:  STM to L QL and paraspinals,  UPA to lumbar spine, traction and long axis traction to left leg - tolerated well   Therapeutic Activity:  Self Care: Trigger Point Dry Needling:  Modalities:      PATIENT EDUCATION:  Education details: on HEP, on progress made and plan moving forward. Discussed plan moving forward and MD f/u Person educated: Patient Education method: Explanation, Demonstration, and Handouts Education comprehension: verbalized understanding   HOME EXERCISE PROGRAM: [id number] Self traction with bed sheets Boat pose, hip add iso Hip add iso with log roll Sitting posture with hips in neutral Laying on towel on half of spine  ASSESSMENT:  CLINICAL IMPRESSION: Continued to focus on manual interventions and update HEP. Patient with limited space along left lower trunk with lumbar spine in left SB and R ROT. Educated on this and how to address this in different postures. Discussed MD f/u since initial MRI was declined secondary to not having tried PT at the time. Will continue with current POC As tolerated.   EVAL: Patient presents to physical therapy with complaints of  chronic low back pain that radiates into the left leg into left scrotum and also into right leg.  Pain is worse in the morning as well with sitting and standing.  Movement and walking tends to help with his pain.  Patient presents with limitations in range of motion and posture that is likely contributing to current presentation.  Patient greatly benefit from skilled PT to address physical impairments to return patient to optimal function.  OBJECTIVE IMPAIRMENTS: decreased activity tolerance, decreased ROM, decreased strength, postural dysfunction, and pain.   ACTIVITY LIMITATIONS: sitting and standing  PARTICIPATION LIMITATIONS: community activity  PERSONAL FACTORS: Fitness and 1-2 comorbidities: cervical fusion, right biceps repair are also affecting patient's functional outcome.   REHAB POTENTIAL: Good  CLINICAL DECISION MAKING: Stable/uncomplicated  EVALUATION COMPLEXITY: Low   GOALS: Goals reviewed with patient? yes  SHORT TERM GOALS: Target date: 11/26/2023   Patient will be independent in self management strategies to improve quality of life and functional outcomes. Baseline: New Program Goal status: MET  2.  Patient will report at least 50% improvement in overall symptoms and/or function to demonstrate improved functional mobility Baseline: 0% better Goal status: MET  3.  Patient will be able to stand for 10 minutes at a time without right leg going numb Baseline: Unable Goal status: PROGRESSING/NOT MET      LONG TERM GOALS: Target date: 01/07/2024    Patient will report at least 75% improvement in overall symptoms and/or function to demonstrate improved functional mobility Baseline: 0% better Goal status: PROGRESSING  2.  Patient will score at least 2 points higher on PSFS average to demonstrate change in overall function. Baseline: see above Goal status: PROGRESSING  3.  Patient will be able to report sleeping in the morning without waking up due to pain to  improve sleep quality Baseline: Unable painful Goal status: MET   PLAN:  PT FREQUENCY: 1-2x/week  PT DURATION: 12 weeks  PLANNED INTERVENTIONS: 97110-Therapeutic exercises, 97530- Therapeutic activity, [id number]- Neuromuscular re-education, 97535- Self Care, [id number]- Manual therapy, [id number]- Gait training, [phone]- Orthotic Fit/training, [phone]- Canalith repositioning, [id number]- Aquatic Therapy, [id number]- Electrical stimulation (unattended), [phone]- Ionotophoresis 4mg /ml Dexamethasone, Patient/Family education, Balance training, Stair training, Taping, Dry Needling, Joint mobilization, Joint manipulation, Spinal manipulation, Spinal mobilization, Cryotherapy, and Moist heat   PLAN FOR NEXT SESSION:  left SB and right ROT -  focus on addressing this with postures and stretches.   focus on thoracic mobilization . Cervical spine as well as cervical position in regards to mid back pain  traction, self traction techniques, decompressive exercises, breathing exercises   11:27 AM, 12/29/23 Olivia Church, DPT Physical [facility] [place]

## 2023-12-31 ENCOUNTER — Encounter: Payer: Self-pay | Admitting: Physical Therapy

## 2023-12-31 ENCOUNTER — Ambulatory Visit (INDEPENDENT_AMBULATORY_CARE_PROVIDER_SITE_OTHER): Admitting: Physical Therapy

## 2023-12-31 DIAGNOSIS — M6281 Muscle weakness (generalized): Secondary | ICD-10-CM | POA: Diagnosis not present

## 2023-12-31 DIAGNOSIS — M5459 Other low back pain: Secondary | ICD-10-CM | POA: Diagnosis not present

## 2023-12-31 NOTE — Therapy (Signed)
 OUTPATIENT PHYSICAL THERAPY THORACOLUMBAR TREATMENT  PHYSICAL THERAPY DISCHARGE SUMMARY  Visits from Start of Care: 10  Current functional level related to goals / functional outcomes: See below   Remaining deficits: See below   Education / Equipment: See below   Patient agrees to discharge. Patient goals were partially met. Patient is being discharged due to the patient's request.  Patient Name: Keith Mckee MRN: [id number] DOB:06-04-1961, 63 y.o., male Today's Date: 12/31/2023  END OF SESSION:  PT End of Session - 12/31/23 0848     Visit Number 10    Number of Visits 16    Date for PT Re-Evaluation 01/07/24    PT Start Time 0849    PT Stop Time 0927    PT Time Calculation (min) 38 min    Activity Tolerance No increased pain;Patient tolerated treatment well    Behavior During Therapy [facility] for tasks assessed/performed           Past Medical History:  Diagnosis Date   Anxiety    BPH (benign prostatic hypertrophy)    Cataract    artifiicial lenses in both eyes   Cervical spondylosis    with radiculopathy not responsive to conservative therapies:  [facility] 02/2015: ACDF surgery C5-C7 performed 04/05/15   Chronic pain syndrome    cervicalgia, heredetary PN, LBP   COVID-19 virus infection 11/2020   Depression    Difficulty controlling anger    + GAD with OCD tendencies + memory changes---consider neuropsych testing   DISH (diffuse idiopathic skeletal hyperostosis)    Dysphagia    Dysplastic nevus 10/2015   left mid back: Dr. Livingston following   GERD (gastroesophageal reflux disease)    +LPR (ENT 09/2022)   Glaucoma    History of adenomatous polyp of colon    IBS (irritable bowel syndrome)    +constipation   Microscopic colitis 06/2019   colonoscopy + 06/2019   Neuropathic pain    Peripheral neuropathy    Idiopathic, inherited per pt's neurologist, Dr. Tonuzi. Various labs normal 07/2016 except vitamin B6 was siginificantly elevated.   Allergist eval/allergy testing (including food) ALL NORMAL/NEG 07/2016.   Restless legs syndrome 09/27/2014   Throat irritation    chronic (since cervical spine surgery 2016).  Dr. Arlana 2017->laryngoscopy normal, CT sinuses normal, barium swallow normal, trial of protonix no help.    Past Surgical History:  Procedure Laterality Date   Ant cerv decompression/discectomy w/fusion C 5-7  04/05/2015   C5-7 ACDF   CATARACT EXTRACTION Bilateral    COLONOSCOPY  06/29/2019   FOR EVAL OF UNEXPLAINED DIARRHEA-->normal, bx c/w microscopic colitis->uceris  and budesonide  rx'd by Dr. Shila 06/2019   COLONOSCOPY W/ POLYPECTOMY  08/2012; 05/23/16   2017, adenomatous polyp--recall 5 yrs.   ESOPHAGOGASTRODUODENOSCOPY     05/2023.  Some gastritis was seen.  No stricture but dilation was done anyway.  Biopsies were benign.  No H. pylori.  No intraepithelial eosinophils.   EYE SURGERY Right 09/06/2020   TRABECULECTOMY Bilateral    for uncontrolled glaucoma    TRANSURETHRAL RESECTION OF PROSTATE  2008   due to enlarged prostate   VASECTOMY  2001   Patient Active Problem List   Diagnosis Date Noted   Dysphagia 09/26/2022   Hypertropia of right eye 06/12/2021   Cystoid macular edema of right eye 12/05/2020   Irregular astigmatism of right eye 01/26/2019   S/P eye surgery 01/26/2019   Monocular diplopia of right eye 01/14/2019   Pseudophakia of both eyes 08/04/2018  Age-related nuclear cataract of left eye 02/17/2018   Pseudophakia of right eye 02/17/2018   Glaucoma 12/24/2017   Health maintenance examination 12/12/2015   Idiopathic peripheral neuropathy 11/28/2015   Low back pain 11/28/2015   Lumbar facet arthropathy 11/28/2015   Neck pain 11/28/2015   Deviated nasal septum 10/24/2015   Gastroesophageal reflux disease 10/24/2015   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 04/05/2015   Cervical spinal stenosis 04/05/2015   Cervical spondylosis with radiculopathy 04/05/2015    Spondylosis 03/24/2015   Restless legs syndrome 09/27/2014   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   Primary open-angle glaucoma 12/21/2013   Abnormal cardiovascular stress test 10/21/2013   Dyspnea 06/04/2013   Disturbance of skin sensation 06/11/2012   Polyneuropathy in other diseases classified elsewhere (HCC) 06/11/2012   Cervical spondylosis without myelopathy 06/11/2012    PCP: Lowella Benton CROME, PA  REFERRING PROVIDER: Lowella Benton CROME, PA  REFERRING DIAG: [phone] (ICD-10-CM) - Acute right-sided low back pain with left-sided sciatica  Rationale for Evaluation and Treatment: Rehabilitation  THERAPY DIAG:  Other low back pain  Muscle weakness (generalized)  ONSET DATE: a while ago  SUBJECTIVE:                                                                                                                                                                                           SUBJECTIVE STATEMENT: 12/31/2023 States that he felt better after last session but then tweaked his back yesterday stretching. States that sleeping on his right side was painful last night. Middle back pain was better yesterday.   Mid and lower back are about 70% better since start of PT      Eval: States he has had this pain for a while. States [facility] to [facility] and his treatments didn't help States that pain was radiate to his left testicle.  Patient also has a chronic low back pain that radiates into his right leg causing his right leg to go numb after standing for any amount of time.  Reports the mornings are worse and he tosses and turns and cannot sleep.  Reports he historically was working out 5 days a week with hiking and strength training as well as some yoga exercises but has had to put some of this on hold with his recent biceps repair surgery.   PERTINENT HISTORY  Recent distal biceps repair  09/09/23, neuropathy, IBS, DISH, GERD, C5-7 fusion 04/08/15  PAIN:  Are you having  pain? Yes: NPRS scale: 2/10 Pain location: low and mid back Pain description: constant ache  Aggravating factors: sitting, AM Relieving factors: standing and walking  PRECAUTIONS: None  RED FLAGS: None   WEIGHT BEARING RESTRICTIONS: No  FALLS:  Has patient fallen in last 6 months? 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  DIAGNOSTIC FINDINGS:  09/10/23 lumbar xray FINDINGS: There is no evidence of lumbar spine fracture. Alignment is normal. Moderate degenerative disc disease is noted at L1-2, L2-3 and L5-S1. Mild degenerative disc disease is noted at L3-4 and L4-5 with anterior osteophyte formation.   IMPRESSION: Multilevel degenerative changes as noted above. No acute abnormality seen.    PATIENT SURVEYS:  Patient-specific activity functional scoring scheme (Point to one number):  0 represents "unable to perform." 10 represents "able to perform at prior level. 0 1 2 3 4 5 6 7 8 9  10 (Date and Score) Activity Initial  Activity Eval  6/17   Standing a couple minutes   7  7  Sleeping in AM   5  8  sitting 7 9   Additional Additional Total score = sum of the activity scores/number of activities Minimum detectable change (90%CI) for average score = 2 points Minimum detectable change (90%CI) for single activity score = 3 points PSFS developed by: Rosalee MYRTIS Marvis KYM Charlet CHRISTELLA., & Binkley, J. (1995). Assessing disability and change on individual  patients: a report of a patient specific measure. Physiotherapy [place], 47, [phone]. Reproduced with the permission of the authors  Score: EVAL: 19/30=6.33,  6/17 24/3 =8   COGNITION: Overall cognitive status: Within functional limits for tasks assessed     SENSATION: Not tested    POSTURE: forward head and decreased  lumbar lordosis  PALPATION: NA  LUMBAR ROM:   AROM 6/17  Flexion 75% limited  Extension 90% limited (felt good)  Right lateral flexion 60% limited    Left lateral flexion 75% limited * pain in right low back  Right rotation   Left rotation    (Blank rows = not tested)    LE Measurements - will assess in future sessions Lower Extremity Right 6/17 Left 6/17   A/PROM MMT A/PROM MMT  Hip Flexion      Hip Extension      Hip Abduction      Hip Adduction      Hip Internal rotation 30  40   Hip External rotation 70  60   Knee Flexion      Knee Extension      Ankle Dorsiflexion      Ankle Plantarflexion      Ankle Inversion      Ankle Eversion       (Blank rows = not tested) * pain       TREATMENT DATE:  12/31/2023  Therapeutic Exercise:  Review of HEP,  and POC Supine:  Prone: child's pose with left lumbar rotation - PT assist  Seated: right fwd reach with left rotation and right SB - tolerated well - left arm in ER to lock out shoulder blade  Standing:  Neuromuscular Re-education  Manual Therapy:  STM to B QL and paraspinals,  UPA to lumbar spine  grade II/III  Therapeutic Activity:  Self Care: Trigger Point Dry Needling:  Modalities:      PATIENT EDUCATION:  Education details: on HEP and current presentation  Person educated: Patient Education method: Programmer, multimedia, Demonstration, and Handouts Education comprehension: verbalized understanding   HOME EXERCISE PROGRAM: [id number] Self traction with bed sheets Boat pose, hip add iso Hip add iso with log roll Sitting posture with hips in neutral Laying on towel on half of spine  ASSESSMENT:  CLINICAL IMPRESSION: Focused on review of HEP and answering all questions. Reduced pain after manual work. Continues to have limitations in lumbar spine and favors right rotation and left side  bending. Discussed MD f/u and possible manipulation. Patient to DC from PT to HEP secondary to independence in HEP.   EVAL: Patient presents to physical therapy with complaints of chronic low back pain that radiates into the left leg into left scrotum and also into right leg.  Pain is worse in the morning as well with sitting and standing.  Movement and walking tends to help with his pain.  Patient presents with limitations in range of motion and posture that is likely contributing to current presentation.  Patient greatly benefit from skilled PT to address physical impairments to return patient to optimal function.  OBJECTIVE IMPAIRMENTS: decreased activity tolerance, decreased ROM, decreased strength, postural dysfunction, and pain.   ACTIVITY LIMITATIONS: sitting and standing  PARTICIPATION LIMITATIONS: community activity  PERSONAL FACTORS: Fitness and 1-2 comorbidities: cervical fusion, right biceps repair are also affecting patient's functional outcome.   REHAB POTENTIAL: Good  CLINICAL DECISION MAKING: Stable/uncomplicated  EVALUATION COMPLEXITY: Low   GOALS: Goals reviewed with patient? yes  SHORT TERM GOALS: Target date: 11/26/2023   Patient will be independent in self management strategies to improve quality of life and functional outcomes. Baseline: New Program Goal status: MET  2.  Patient will report at least 50% improvement in overall symptoms and/or function to demonstrate improved functional mobility Baseline: 0% better Goal status: MET  3.  Patient will be able to stand for 10 minutes at a time without right leg going numb Baseline: Unable Goal status: PROGRESSING/NOT MET      LONG TERM GOALS: Target date: 01/07/2024    Patient will report at least 75% improvement in overall symptoms and/or function to demonstrate improved functional mobility Baseline: 0% better Goal status: PROGRESSING  2.  Patient will score at least 2 points higher on PSFS average to  demonstrate change in overall function. Baseline: see above Goal status: PROGRESSING  3.  Patient will be able to report sleeping in the morning without waking up due to pain to improve sleep quality Baseline: Unable painful Goal status: MET   PLAN:  PT FREQUENCY: 1-2x/week  PT DURATION: 12 weeks  PLANNED INTERVENTIONS: 97110-Therapeutic exercises, 97530- Therapeutic activity, 97112- Neuromuscular re-education, 97535- Self Care, 02859- Manual therapy, [phone]- Gait training, [phone]- Orthotic Fit/training, [phone]- Canalith repositioning, [id number]- Aquatic Therapy, 97014- Electrical stimulation (unattended), [phone]- Ionotophoresis 4mg /ml Dexamethasone, Patient/Family education, Balance training, Stair training, Taping, Dry Needling, Joint mobilization, Joint manipulation, Spinal manipulation, Spinal mobilization, Cryotherapy, and Moist heat  PLAN FOR NEXT SESSION: DC to HEP    9:41 AM, 12/31/23 Olivia Church, DPT Physical Therapy with [facility]

## 2024-01-07 NOTE — Progress Notes (Unsigned)
    Ke[facility] [address] [place] [address] Phone: [phone]   Assessment and Plan:     There are no diagnoses linked to this encounter.  ***   Pertinent previous records reviewed include ***    Follow Up: ***     Subjective:   I, Keith Mckee, am serving as a Neurosurgeon for Doctor Morene Mace  Chief Complaint: low back pain  HPI:   01/08/2024 Patient is a 63 year old male with low back pain. Patient states   Relevant Historical Information: ***  Additional pertinent review of systems negative.   Current Outpatient Medications:    Alpha-Lipoic Acid 300 MG CAPS, Take 1 capsule by mouth 2 (two) times daily., Disp: , Rfl:    amphetamine -dextroamphetamine (ADDERALL XR) 10 MG 24 hr capsule, Take 1 capsule (10 mg total) by mouth daily. (Patient not taking: Reported on 12/22/2023), Disp: 30 capsule, Rfl: 0   busPIRone  (BUSPAR ) 15 MG tablet, TAKE 2 TABLETS BY MOUTH TWICE A DAY, Disp: 120 tablet, Rfl: 2   clonazePAM  (KLONOPIN ) 0.5 MG tablet, Take 1 tablet (0.5 mg total) by mouth 2 (two) times daily as needed for anxiety., Disp: 120 tablet, Rfl: 0   dorzolamide-timolol (COSOPT) 22.3-6.8 MG/ML ophthalmic solution, Place 1 drop into the right eye 2 (two) times daily., Disp: , Rfl:    famotidine (PEPCID) 10 MG tablet, Take 10 mg by mouth 2 (two) times daily., Disp: , Rfl:    gabapentin  (NEURONTIN ) 600 MG tablet, Take 600 mg by mouth. Take 2 tablets in the morning & 2 in the evening, Disp: , Rfl:    HYDROcodone -acetaminophen  (NORCO) 7.5-325 MG tablet, Take 1 tablet by mouth every 6 (six) hours as needed for moderate pain (pain score 4-6)., Disp: 30 tablet, Rfl: 0   latanoprost (XALATAN) 0.005 % ophthalmic solution, Place 1 drop into both eyes at bedtime., Disp: , Rfl:    metaxalone  (SKELAXIN ) 800 MG tablet, Take 1 tablet (800 mg total) by mouth 3 (three) times daily as needed for muscle spasms (back pain)., Disp: 30 tablet,  Rfl: 0   Omega-3 Fatty Acids (OMEGA-3 FISH OIL) 300 MG CAPS, Take 1 capsule by mouth daily., Disp: , Rfl:    polyethylene glycol powder (GLYCOLAX/MIRALAX) 17 GM/SCOOP powder, Take by mouth., Disp: , Rfl:    sildenafil  (REVATIO ) 20 MG tablet, 1-5 tabs po qd prn intercourse, Disp: 30 tablet, Rfl: 11   valACYclovir  (VALTREX ) 1000 MG tablet, Take 1 tablet (1,000 mg total) by mouth 2 (two) times daily., Disp: 14 tablet, Rfl: 0   Objective:     There were no vitals filed for this visit.    There is no height or weight on file to calculate BMI.    Physical Exam:    ***   Electronically signed by:  Odis Mace [facility] 7:36 AM 01/07/24

## 2024-01-08 ENCOUNTER — Ambulatory Visit

## 2024-01-08 ENCOUNTER — Ambulatory Visit: Admitting: Sports Medicine

## 2024-01-08 VITALS — BP 118/84 | Ht 73.0 in | Wt 168.0 lb

## 2024-01-08 DIAGNOSIS — M5134 Other intervertebral disc degeneration, thoracic region: Secondary | ICD-10-CM | POA: Diagnosis not present

## 2024-01-08 DIAGNOSIS — M5136 Other intervertebral disc degeneration, lumbar region with discogenic back pain only: Secondary | ICD-10-CM

## 2024-01-08 DIAGNOSIS — G8929 Other chronic pain: Secondary | ICD-10-CM | POA: Diagnosis not present

## 2024-01-08 DIAGNOSIS — M545 Low back pain, unspecified: Secondary | ICD-10-CM

## 2024-01-08 MED ORDER — MELOXICAM 15 MG PO TABS: 15.0000 mg | ORAL_TABLET | Freq: Every day | ORAL | 0 refills | Status: DC

## 2024-01-08 NOTE — Patient Instructions (Addendum)
-   Start meloxicam  15 mg daily x2 weeks.  If still having pain after 2 weeks, complete 3rd-week of NSAID. May use remaining NSAID as needed once daily for pain control.  Do not to use additional over-the-counter NSAIDs (ibuprofen, naproxen, Advil, Aleve, etc.) while taking prescription NSAIDs.  May use Tylenol  [id number] mg 2 to 3 times a day for breakthrough pain. Low back HEP Xrays on the way out  MRI referral  Follow up 5 days after MRI to discuss results

## 2024-01-10 ENCOUNTER — Other Ambulatory Visit: Payer: Self-pay | Admitting: Family Medicine

## 2024-01-15 ENCOUNTER — Ambulatory Visit: Payer: Self-pay | Admitting: Sports Medicine

## 2024-01-19 ENCOUNTER — Ambulatory Visit: Admitting: Gastroenterology

## 2024-01-22 NOTE — Progress Notes (Signed)
 Keith Mckee [id number] December 11, 1960   Chief Complaint: Nausea, abdominal pain, loose stools  Referring Provider: Candise Aleene DEL, MD Primary GI MD: Dr. Shila  HPI: Keith Mckee is a 63 y.o. male with past medical history of anxiety/depression, BPH, chronic pain syndrome, DISH, dysphagia, GERD, adenomatous colon polyps, IBS, lymphocytic colitis who presents today for a complaint of abdominal pain.    Patient last seen in office 05/29/2023 by Keith Dasen, NP for complaint of nausea without vomiting.  Symptoms improved but not resolved with famotidine.  Reported GERD history, asymptomatic for years off treatment but upper GI series done for dysphagia 10/2022 showed mild reflux.  Dysphagia improved with exercises given by SLP.  Had been having trouble swallowing saliva, not food. History of lymphocytic colitis, possibly related to SSRI use with no recurrence of diarrhea.  Chronic constipation managed with daily MiraLAX. He has history of chronic thrombocytopenia, no evidence of cirrhosis on RUQ US .  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  He underwent EGD 06/12/2023 with finding of gastritis, biopsied.  Esophagus was empirically dilated due to patient's dysphagia but no esophageal abnormality was seen to explain this.  Seen by PCP 12/22/2023 for complaint of abdominal discomfort and nausea.  Noted to have had fairly extensive workup which was unrevealing.  Advised to continue Pepcid 10 mg twice a day. Hesitant to try PPI due to long term side effects/nutritional concerns.    Patient states he has intermittent, severe central abdominal pain which causes nausea and vomiting.  Has been having this problem for about a year now, also following with his PCP.  Has had extensive workup which has so far been unrevealing.  He is currently taking Pepcid at night and omeprazole in the morning, but not noticing much improvement on this regimen.  States he has a  constant feeling of uneasiness, and on pain scale would write this to 2/10.  Does not experience nausea daily.  Certain strong smells seem to bring on nausea.  States stools are generally not formed, but are loose/mushy.  He does have watery diarrhea on occasion.  He does take a fiber supplement daily in the morning.  He does have history of lymphocytic colitis seen on biopsies from colonoscopy in 2021, and was treated with 90 days of budesonide .  The had been having frequent diarrhea on that time, but was also on an antidepressant, and diarrhea resolved after he discontinued that medication.  For a while had normal bowel movements, but in the last year stools have been looser.  Also reports occasional gas cramps which can last a few hours.  He denies any heartburn or acid reflux.  States that he had heartburn years ago which resolved with dietary changes.  Aside from omeprazole, he denies any new medications in the last year.  He is very discouraged by his persistent symptoms and they affect his daily life.  Previous GI Procedures/Imaging   EGD 06/12/2023 - Z- line regular, 40 cm from the incisors.  - No endoscopic esophageal abnormality to explain patient' s dysphagia. Esophagus dilated. Dilated.  - Gastritis. Biopsied.  - Normal examined duodenum.  - Biopsies were taken with a cold forceps for evaluation of eosinophilic esophagitis. Path: 1. Surgical [P], gastric antrum and gastric body :       -  BENIGN GASTRIC MUCOSA       -  NO H. PYLORI, INTESTINAL METAPLASIA OR MALIGNANCY IDENTIFIED        2. Surgical [P], distal esophagus :       -  BENIGN SQUAMOUS MUCOSA       -  NO INCREASED INTRAEPITHELIAL EOSINOPHILS        3. Surgical [P], proximal esophagus :       -  BENIGN SQUAMOUS MUCOSA       -  NO INCREASED INTRAEPITHELIAL EOSINOPHILS   RUQ US  04/04/2023 No cholelithiasis or sonographic evidence for acute cholecystitis.   Colonoscopy 06/29/2019 - Normal mucosa in the entire  examined colon. Biopsied.  - The examination was otherwise normal. - Recall 10 years Path: 1. Surgical [P], right colon - FINDINGS CONSISTENT WITH LYMPHOCYTIC COLITIS. - NO DYSPLASIA OR MALIGNANCY. 2. Surgical [P], left colon - FINDINGS CONSISTENT WITH LYMPHOCYTIC COLITIS. - NO DYSPLASIA OR MALIGNANCY.  Colonoscopy 05/23/2016 - One 2 mm polyp in the ascending colon, removed with a cold biopsy forceps. Resected and retrieved.  - One 6 mm polyp in the rectum, removed with a cold snare. Resected and retrieved.  - Non-bleeding internal hemorrhoids. - Await pathology results.  - Repeat colonoscopy in 5-10 years for surveillance based on pathology results. Path: 1. Surgical [P], ascending, polyp - TUBULAR ADENOMA. - NO HIGH GRADE DYSPLASIA OR MALIGNANCY. 2. Surgical [P], rectum, polyp - HYPERPLASTIC POLYP. - NO DYSPLASIA OR MALIGNANCY.  Past Medical History:  Diagnosis Date   Anxiety    BPH (benign prostatic hypertrophy)    Cataract    artifiicial lenses in both eyes   Cervical spondylosis    with radiculopathy not responsive to conservative therapies:  [facility] 02/2015: ACDF surgery C5-C7 performed 04/05/15   Chronic pain syndrome    cervicalgia, heredetary PN, LBP   COVID-19 virus infection 11/2020   Depression    Difficulty controlling anger    + GAD with OCD tendencies + memory changes---consider neuropsych testing   DISH (diffuse idiopathic skeletal hyperostosis)    Dysphagia    Dysplastic nevus 10/2015   left mid back: Dr. Livingston following   GERD (gastroesophageal reflux disease)    +LPR (ENT 09/2022)   Glaucoma    History of adenomatous polyp of colon    IBS (irritable bowel syndrome)    +constipation   Microscopic colitis 06/2019   colonoscopy + 06/2019   Neuropathic pain    Peripheral neuropathy    Idiopathic, inherited per pt's neurologist, Dr. Tonuzi. Various labs normal 07/2016 except vitamin B6 was siginificantly elevated.  Allergist  eval/allergy testing (including food) ALL NORMAL/NEG 07/2016.   Restless legs syndrome 09/27/2014   Throat irritation    chronic (since cervical spine surgery 2016).  Dr. Arlana 2017->laryngoscopy normal, CT sinuses normal, barium swallow normal, trial of protonix no help.     Past Surgical History:  Procedure Laterality Date   Ant cerv decompression/discectomy w/fusion C 5-7  04/05/2015   C5-7 ACDF   CATARACT EXTRACTION Bilateral    COLONOSCOPY  06/29/2019   FOR EVAL OF UNEXPLAINED DIARRHEA-->normal, bx c/w microscopic colitis->uceris  and budesonide  rx'd by Dr. Shila 06/2019   COLONOSCOPY W/ POLYPECTOMY  08/2012; 05/23/16   2017, adenomatous polyp--recall 5 yrs.   ESOPHAGOGASTRODUODENOSCOPY     05/2023.  Some gastritis was seen.  No stricture but dilation was done anyway.  Biopsies were benign.  No H. pylori.  No intraepithelial eosinophils.   EYE SURGERY Right 09/06/2020   TRABECULECTOMY Bilateral    for uncontrolled glaucoma    TRANSURETHRAL RESECTION OF PROSTATE  2008   due to enlarged prostate   VASECTOMY  2001    Current Outpatient Medications  Medication Sig Dispense Refill  Alpha-Lipoic Acid 300 MG CAPS Take 1 capsule by mouth 2 (two) times daily.     busPIRone  (BUSPAR ) 15 MG tablet TAKE 2 TABLETS BY MOUTH TWICE A DAY 120 tablet 2   clonazePAM  (KLONOPIN ) 0.5 MG tablet Take 1 tablet (0.5 mg total) by mouth 2 (two) times daily as needed for anxiety. 120 tablet 0   dorzolamide-timolol (COSOPT) 22.3-6.8 MG/ML ophthalmic solution Place 1 drop into the right eye 2 (two) times daily.     famotidine (PEPCID) 10 MG tablet Take 10 mg by mouth 2 (two) times daily.     gabapentin  (NEURONTIN ) 600 MG tablet Take 600 mg by mouth. Take 2 tablets in the morning & 2 in the evening     HYDROcodone -acetaminophen  (NORCO) 7.5-325 MG tablet Take 1 tablet by mouth every 6 (six) hours as needed for moderate pain (pain score 4-6). 30 tablet 0   latanoprost (XALATAN) 0.005 % ophthalmic solution  Place 1 drop into both eyes at bedtime.     meloxicam  (MOBIC ) 15 MG tablet Take 1 tablet (15 mg total) by mouth daily. 30 tablet 0   metaxalone  (SKELAXIN ) 800 MG tablet Take 1 tablet (800 mg total) by mouth 3 (three) times daily as needed for muscle spasms (back pain). 30 tablet 0   Omega-3 Fatty Acids (OMEGA-3 FISH OIL) 300 MG CAPS Take 1 capsule by mouth daily.     polyethylene glycol powder (GLYCOLAX/MIRALAX) 17 GM/SCOOP powder Take by mouth.     sildenafil  (REVATIO ) 20 MG tablet 1-5 tabs po qd prn intercourse 30 tablet 11   valACYclovir  (VALTREX ) 1000 MG tablet Take 1 tablet (1,000 mg total) by mouth 2 (two) times daily. 14 tablet 0   No current facility-administered medications for this visit.    Allergies as of 01/23/2024   (No Known Allergies)    Family History  Problem Relation Age of Onset   Glaucoma Father    Heart disease Father    Neuropathy Father        Peripheral neuropathy   Arthritis Father    Parkinson's disease Father    Schizophrenia Daughter    Colon cancer Neg Hx    Rectal cancer Neg Hx    Stomach cancer Neg Hx    Esophageal cancer Neg Hx     Social History   Tobacco Use   Smoking status: Former    Current packs/day: 0.00    Average packs/day: 0.8 packs/day for 15.0 years (11.3 ttl pk-yrs)    Types: Cigarettes    Start date: 06/25/1983    Quit date: 06/24/1998    Years since quitting: 25.5   Smokeless tobacco: Never   Tobacco comments:    2 pack per week  Vaping Use   Vaping status: Never Used  Substance Use Topics   Alcohol use: Yes    Alcohol/week: 12.0 standard drinks of alcohol    Types: 12 Cans of beer per week    Comment: Patient drinks 12 beers a week   Drug use: No     Review of Systems:    Constitutional: No weight loss, fever, chills, weakness  Skin: No rash or itching Cardiovascular: No chest pain, chest pressure or palpitations   Respiratory: No SOB or cough Gastrointestinal: See HPI and otherwise negative Neurological: No  headache, dizziness or syncope Hematologic: No bleeding or bruising    Physical Exam:  BP 116/68   Pulse 68   Ht 6' (1.829 m)   Wt 169 lb (76.7 kg)   BMI 22.92 kg/m  Wt Readings from Last 3 Encounters:  01/23/24 169 lb (76.7 kg)  01/08/24 168 lb (76.2 kg)  12/22/23 167 lb 6.4 oz (75.9 kg)   Constitutional: Pleasant male in NAD, alert and cooperative Head:  Normocephalic and atraumatic.  Eyes: No scleral icterus.  Mouth: No oral lesions. Respiratory: Respirations even and unlabored. Lungs clear to auscultation bilaterally.  No wheezes, crackles, or rhonchi.  Cardiovascular:  Regular rate and rhythm. No murmurs. No peripheral edema. Gastrointestinal:  Soft, nondistended, nontender. No rebound or guarding. Normal bowel sounds. No appreciable masses or hepatomegaly. Rectal:  Not performed.  Neurologic:  Alert and oriented x4;  grossly normal neurologically.  Skin:   Dry and intact without significant lesions or rashes. Psychiatric: Oriented to person, place and time. Demonstrates good judgement and reason without abnormal affect or behaviors.   RELEVANT LABS AND IMAGING: CBC    Component Value Date/Time   WBC 6.1 03/14/2023 0935   RBC 5.10 03/14/2023 0935   HGB 15.8 03/14/2023 0935   HCT 45.6 03/14/2023 0935   PLT 146 (L) 03/14/2023 0935   MCV 89.4 03/14/2023 0935   MCH 31.0 03/14/2023 0935   MCHC 34.6 03/14/2023 0935   RDW 13.1 03/14/2023 0935   LYMPHSABS 1.7 03/14/2023 0935   MONOABS 0.6 03/14/2023 0935   EOSABS 0.1 03/14/2023 0935   BASOSABS 0.0 03/14/2023 0935    CMP     Component Value Date/Time   NA 141 03/14/2023 0935   NA 140 03/03/2023 1645   K 4.3 03/14/2023 0935   CL 105 03/14/2023 0935   CO2 30 03/14/2023 0935   GLUCOSE 72 03/14/2023 0935   BUN 17 03/14/2023 0935   BUN 18 03/03/2023 1645   CREATININE 1.01 03/14/2023 0935   CREATININE 1.10 10/02/2021 0932   CALCIUM 9.6 03/14/2023 0935   PROT 6.7 03/14/2023 0935   PROT 6.6 03/03/2023 1645    ALBUMIN 4.4 03/14/2023 0935   ALBUMIN 4.6 03/03/2023 1645   AST 16 03/14/2023 0935   ALT 14 03/14/2023 0935   ALKPHOS 69 03/14/2023 0935   BILITOT 0.7 03/14/2023 0935   BILITOT 0.6 03/03/2023 1645   GFRNONAA >60 03/14/2023 0935     Assessment/Plan:   GERD Nausea without vomiting Generalized abdominal pain Change in bowel habits Loose stools Patient seen today in follow-up of persistent symptoms of nausea and abdominal pain.  Has not noticed improvement on Pepcid and omeprazole.  Has had unremarkable RUQ US , and EGD in December with findings of gastritis and otherwise normal. He is discouraged by the persistence of his symptoms and would like to pursue further workup. He does have history of lymphocytic colitis, seen on biopsies from colonoscopy in 2021.  Was treated with budesonide  at that time.  In the last year he has been having soft, less formed stools.  Also having some intermittent diarrhea.  No rectal bleeding or melena.  Abdominal pain not necessarily related to bowel movements.  - Check labs CBC, CMP, lipase, TSH - Order fecal calprotectin - Continue OTC omeprazole 20 mg daily, famotidine 10 mg 1-2 times daily - Consider colonoscopy if symptoms persist, worsening diarrhea, or elevated fecal calprotectin - Consider SIBO breath test vs empiric treatment with Xifaxan for IBS-D - Consider abdominal imaging, HIDA scan  Camie Furbish, PA-C  Gastroenterology 01/22/2024, 4:11 PM  Patient Care Team: McGowen, Philip H, MD as PCP - General (Family Medicine) Lonni Slain, MD as PCP - Cardiology (Cardiology) Bond, Reyes Mcardle, MD as Consulting Physician (Ophthalmology) Livingston Rigg, MD as Consulting  Physician (Dermatology) Nandigam, Kavitha V, MD as Consulting Physician (Gastroenterology) Tonuzi, Lirim, MD as Consulting Physician (Neurology) Frutoso Luz, MD as Consulting Physician (Allergy) Evertt Meribeth ORN, MD as Consulting Physician  (Ophthalmology) Alverda Mardy SAUNDERS, MD as Consulting Physician (Rheumatology) Skotnicki, Meghan A, DO as Consulting Physician (Otolaryngology) Delayne Mirna CROME, DO as Consulting Physician (Otolaryngology)

## 2024-01-23 ENCOUNTER — Other Ambulatory Visit

## 2024-01-23 ENCOUNTER — Ambulatory Visit: Payer: Self-pay | Admitting: Gastroenterology

## 2024-01-23 ENCOUNTER — Ambulatory Visit: Admitting: Gastroenterology

## 2024-01-23 ENCOUNTER — Encounter: Payer: Self-pay | Admitting: Gastroenterology

## 2024-01-23 VITALS — BP 116/68 | HR 68 | Ht 72.0 in | Wt 169.0 lb

## 2024-01-23 DIAGNOSIS — R1084 Generalized abdominal pain: Secondary | ICD-10-CM

## 2024-01-23 DIAGNOSIS — R194 Change in bowel habit: Secondary | ICD-10-CM | POA: Diagnosis not present

## 2024-01-23 DIAGNOSIS — R195 Other fecal abnormalities: Secondary | ICD-10-CM

## 2024-01-23 DIAGNOSIS — R110 Nausea: Secondary | ICD-10-CM

## 2024-01-23 DIAGNOSIS — R197 Diarrhea, unspecified: Secondary | ICD-10-CM

## 2024-01-23 DIAGNOSIS — K219 Gastro-esophageal reflux disease without esophagitis: Secondary | ICD-10-CM

## 2024-01-23 LAB — CBC WITH DIFFERENTIAL/PLATELET
Basophils Absolute: 0.1 K/uL (ref 0.0–0.1)
Basophils Relative: 0.8 % (ref 0.0–3.0)
Eosinophils Absolute: 0.3 K/uL (ref 0.0–0.7)
Eosinophils Relative: 4 % (ref 0.0–5.0)
HCT: 46.5 % (ref 39.0–52.0)
Hemoglobin: 15.8 g/dL (ref 13.0–17.0)
Lymphocytes Relative: 33.9 % (ref 12.0–46.0)
Lymphs Abs: 2.2 K/uL (ref 0.7–4.0)
MCHC: 34 g/dL (ref 30.0–36.0)
MCV: 90.2 fl (ref 78.0–100.0)
Monocytes Absolute: 0.5 K/uL (ref 0.1–1.0)
Monocytes Relative: 7.8 % (ref 3.0–12.0)
Neutro Abs: 3.5 K/uL (ref 1.4–7.7)
Neutrophils Relative %: 53.5 % (ref 43.0–77.0)
Platelets: 165 K/uL (ref 150.0–400.0)
RBC: 5.16 Mil/uL (ref 4.22–5.81)
RDW: 14 % (ref 11.5–15.5)
WBC: 6.6 K/uL (ref 4.0–10.5)

## 2024-01-23 LAB — COMPREHENSIVE METABOLIC PANEL WITH GFR
ALT: 19 U/L (ref 0–53)
AST: 16 U/L (ref 0–37)
Albumin: 4.6 g/dL (ref 3.5–5.2)
Alkaline Phosphatase: 64 U/L (ref 39–117)
BUN: 20 mg/dL (ref 6–23)
CO2: 29 meq/L (ref 19–32)
Calcium: 9.5 mg/dL (ref 8.4–10.5)
Chloride: 103 meq/L (ref 96–112)
Creatinine, Ser: 1.15 mg/dL (ref 0.40–1.50)
GFR: 68.71 mL/min (ref >60.00)
Glucose, Bld: 89 mg/dL (ref 70–99)
Potassium: 4.3 meq/L (ref 3.5–5.1)
Sodium: 140 meq/L (ref 135–145)
Total Bilirubin: 0.8 mg/dL (ref 0.2–1.2)
Total Protein: 7.1 g/dL (ref 6.0–8.3)

## 2024-01-23 LAB — LIPASE: Lipase: 41 U/L (ref 11.0–59.0)

## 2024-01-23 LAB — TSH: TSH: 2.93 u[IU]/mL (ref 0.35–5.50)

## 2024-01-23 NOTE — Patient Instructions (Addendum)
 Your provider has requested that you go to the basement level for lab work before leaving today. Press B on the elevator. The lab is located at the first door on the left as you exit the elevator.   Continue present medications  Due to recent changes in healthcare laws, you may see the results of your imaging and laboratory studies on MyChart before your provider has had a chance to review them.  We understand that in some cases there may be results that are confusing or concerning to you. Not all laboratory results come back in the same time frame and the provider may be waiting for multiple results in order to interpret others.  Please give us  48 hours in order for your provider to thoroughly review all the results before contacting the office for clarification of your results.   I appreciate the  opportunity to care for you  Thank You   Camie Heinz,PA-C

## 2024-01-24 ENCOUNTER — Encounter: Payer: Self-pay | Admitting: Gastroenterology

## 2024-01-26 ENCOUNTER — Other Ambulatory Visit

## 2024-01-27 ENCOUNTER — Other Ambulatory Visit

## 2024-01-27 DIAGNOSIS — R195 Other fecal abnormalities: Secondary | ICD-10-CM

## 2024-01-27 DIAGNOSIS — R1084 Generalized abdominal pain: Secondary | ICD-10-CM

## 2024-01-27 DIAGNOSIS — R194 Change in bowel habit: Secondary | ICD-10-CM

## 2024-01-29 LAB — CALPROTECTIN, FECAL: Calprotectin, Fecal: 90 ug/g (ref 0–120)

## 2024-02-02 ENCOUNTER — Telehealth: Payer: Self-pay | Admitting: *Deleted

## 2024-02-02 NOTE — Telephone Encounter (Signed)
 Fecal calprotectin is borderline. We could plan to repeat this in 4-6 weeks, and I would add a C. Diff PCR as well.  Given the persistent nature of his symptoms and his history of lymphocytic colitis, we could go ahead and schedule repeat colonoscopy with biopsies if he is open to this. Could be done with Dr. Shila in [facility], diagnosis diarrhea, change in bowel habits, fecal urgency, history of microscopic colitis. He would need to have a C. Diff PCR prior to rule this out before having colonoscopy.  [employer] Heinz,PA-C

## 2024-02-02 NOTE — Telephone Encounter (Signed)
 I spoke with the patient about his lab results. And his stool test. I put in new orders for a Fecal Calprotectin to be done in 4 weeks and also a CDiff PCR as requested by Camie. We also discussed him scheduling a colonoscopy but he would rather wait and turn these test in, in 4 weeks and come to his follow up appointment with Camie first. His follow up with Camie is 03/10/2024. He will discuss the colonoscopy if needed then.

## 2024-02-04 ENCOUNTER — Telehealth: Payer: Self-pay | Admitting: Sports Medicine

## 2024-02-04 ENCOUNTER — Ambulatory Visit: Admitting: Sports Medicine

## 2024-02-04 DIAGNOSIS — M5136 Other intervertebral disc degeneration, lumbar region with discogenic back pain only: Secondary | ICD-10-CM

## 2024-02-04 DIAGNOSIS — M545 Low back pain, unspecified: Secondary | ICD-10-CM

## 2024-02-04 DIAGNOSIS — G8929 Other chronic pain: Secondary | ICD-10-CM

## 2024-02-04 DIAGNOSIS — M5134 Other intervertebral disc degeneration, thoracic region: Secondary | ICD-10-CM

## 2024-02-04 NOTE — Telephone Encounter (Signed)
 Called patient to confirm the name and address of the location that he wants to change the referral. Patient confirmed [facility]. Referral location has been changed and faxed.

## 2024-02-04 NOTE — Telephone Encounter (Signed)
 Patient called and said that his two MRIs were cancelled because they thought he had a piece of metal in his eye and they would not do it. He says he saw his opthalmologist and they said he did not have a piece of metal in his eye. He states it is part of a shunt he had put in years ago for glaucoma. He would like for new referrals to be sent and would like them sent to [facility]. He states he was going to [facility] in [place]. Please advise

## 2024-02-05 NOTE — Telephone Encounter (Signed)
 Received fax today from [facility] stating that MRI will not be performed due to additional metal in eye from wire brush.  Called patient to inform him of this information. Patient stated that he saw his eye doctor and was cleared for the MRI. Advised patient that no imaging center will do the MRI until additional metal in his eye from wire brush is removed. Ask patient if he would like to get CT scans instead of the MRI. Patient stated that he does not care at this point and will do whatever the doctor recommends. Patient also stated that he was pissed off about the whole process and he has had MRIs after his eye surgeries before.  Called both the eye doctor's office and imaging office to get clarification on conflicting information. Faxed information to both facilities with each others findings. Advised the eye doctor's office to call imaging location and speak with radiologist.  Ordered CT scans for patient.

## 2024-02-06 ENCOUNTER — Ambulatory Visit

## 2024-02-06 ENCOUNTER — Ambulatory Visit (INDEPENDENT_AMBULATORY_CARE_PROVIDER_SITE_OTHER)

## 2024-02-06 DIAGNOSIS — M5136 Other intervertebral disc degeneration, lumbar region with discogenic back pain only: Secondary | ICD-10-CM

## 2024-02-06 DIAGNOSIS — G8929 Other chronic pain: Secondary | ICD-10-CM

## 2024-02-06 DIAGNOSIS — M546 Pain in thoracic spine: Secondary | ICD-10-CM

## 2024-02-06 DIAGNOSIS — M545 Low back pain, unspecified: Secondary | ICD-10-CM

## 2024-02-06 DIAGNOSIS — M5134 Other intervertebral disc degeneration, thoracic region: Secondary | ICD-10-CM

## 2024-02-15 ENCOUNTER — Other Ambulatory Visit: Payer: Self-pay | Admitting: Family Medicine

## 2024-02-19 NOTE — Progress Notes (Unsigned)
 Keith Jackson D.CLEMENTEEN [facility] [address] [place] [address] Phone: [phone]   Assessment and Plan:     1. Chronic bilateral low back pain without sciatica (Primary) 2. Degeneration of intervertebral disc of lumbar region with discogenic back pain 3. Degeneration of intervertebral disc of lumbar region with discogenic back pain and lower extremity pain 4. Chronic bilateral thoracic back pain 5. DDD (degenerative disc disease), thoracic 6. DISH (diffuse idiopathic skeletal hyperostosis) -Chronic with exacerbation, subsequent visit - Continued upper and lower back pain with bilateral radicular symptoms worsening since April 2025.  No improvement with conservative therapy including HEP, physical therapy and NSAID course - CTs of thoracic and lumbar spine reviewed in clinic with patient.  Thoracic CT shows DISH.  Lumbar CT shows multiple areas of significant degenerative changes, spinal stenosis, foraminal stenosis - Patient's degenerative changes throughout thoracic and lumbar spine are fairly advanced for age.  We will trial conservative therapy to see if we can make further improvements - Patient had autoimmune workup in the past due to DISH findings that was unremarkable.  Will not repeat autoimmune workup at this time - Start prednisone Dosepak - Recommend epidural CSI left-sided L5-S1 and right sided L3-L4 based on patient's neurologic symptoms, and CT report    Pertinent previous records reviewed include thoracic and lumbar CT 02/06/2024   Follow Up: 2 weeks after epidural to review benefit of epidural and benefit of prednisone pack.  Could consider repeat epidural versus neurosurgery referral   Subjective:   I, Stephnie Parlier, am serving as a Neurosurgeon for Doctor Morene Mace   Chief Complaint: low back pain   HPI:    01/08/2024 Patient is a 63 year old male with low back pain. Patient states pain since April. No MOI. Meds dont  really help. Muscle relaxer doesn't help. Has been in PT. Pain does not radiate down his legs anymore. No numbness or tingling.    02/20/2024 Patient states no better    Relevant Historical Information: GERD  Additional pertinent review of systems negative.   Current Outpatient Medications:    methylPREDNISolone  (MEDROL  DOSEPAK) 4 MG TBPK tablet, Take 6 tablets on day 1.  Take 5 tablets on day 2.  Take 4 tablets on day 3.  Take 3 tablets on day 4.  Take 2 tablets on day 5.  Take 1 tablet on day 6., Disp: 21 tablet, Rfl: 0   Alpha-Lipoic Acid 300 MG CAPS, Take 1 capsule by mouth 2 (two) times daily., Disp: , Rfl:    busPIRone  (BUSPAR ) 15 MG tablet, TAKE 2 TABLETS BY MOUTH TWICE A DAY, Disp: 360 tablet, Rfl: 0   clonazePAM  (KLONOPIN ) 0.5 MG tablet, Take 1 tablet (0.5 mg total) by mouth 2 (two) times daily as needed for anxiety., Disp: 120 tablet, Rfl: 0   dorzolamide-timolol (COSOPT) 22.3-6.8 MG/ML ophthalmic solution, Place 1 drop into the right eye 2 (two) times daily., Disp: , Rfl:    famotidine (PEPCID) 10 MG tablet, Take 10 mg by mouth 2 (two) times daily., Disp: , Rfl:    gabapentin  (NEURONTIN ) 600 MG tablet, Take 600 mg by mouth. Take 2 tablets in the morning & 2 in the evening, Disp: , Rfl:    HYDROcodone -acetaminophen  (NORCO) 7.5-325 MG tablet, Take 1 tablet by mouth every 6 (six) hours as needed for moderate pain (pain score 4-6)., Disp: 30 tablet, Rfl: 0   latanoprost (XALATAN) 0.005 % ophthalmic solution, Place 1 drop into both eyes at bedtime., Disp: ,  Rfl:    meloxicam  (MOBIC ) 15 MG tablet, Take 1 tablet (15 mg total) by mouth daily., Disp: 30 tablet, Rfl: 0   metaxalone  (SKELAXIN ) 800 MG tablet, Take 1 tablet (800 mg total) by mouth 3 (three) times daily as needed for muscle spasms (back pain)., Disp: 30 tablet, Rfl: 0   Omega-3 Fatty Acids (OMEGA-3 FISH OIL) 300 MG CAPS, Take 1 capsule by mouth daily., Disp: , Rfl:    polyethylene glycol powder (GLYCOLAX/MIRALAX) 17 GM/SCOOP  powder, Take by mouth., Disp: , Rfl:    sildenafil  (REVATIO ) 20 MG tablet, 1-5 tabs po qd prn intercourse, Disp: 30 tablet, Rfl: 11   valACYclovir  (VALTREX ) 1000 MG tablet, Take 1 tablet (1,000 mg total) by mouth 2 (two) times daily., Disp: 14 tablet, Rfl: 0   Objective:     Vitals:   02/20/24 0801  Pulse: (!) 56  SpO2: 96%  Weight: 167 lb (75.8 kg)  Height: 6' (1.829 m)      Body mass index is 22.65 kg/m.    Physical Exam:    Gen: Appears well, nad, nontoxic and pleasant Psych: Alert and oriented, appropriate mood and affect Neuro: Decreased sensation over right lateral thigh compared to left.  Sensation intact, strength is 5/5 in upper and lower extremities, muscle tone wnl Skin: no susupicious lesions or rashes   Back - Normal skin, Spine with normal alignment and no deformity.     tenderness to thoracic and lumbar vertebral process palpation.   Bilateral lumbar and thoracic paraspinous muscles are   tender and without spasm TTP left sacral base NTTP gluteal musculature Straight leg raise negative Trendelenberg positive left Piriformis Test negative Gait normal  Decreased flexion, extension, rotation of lumbar spine.  Lumbar extension reproduced left-sided low back pain without radicular symptoms    Electronically signed by:  Odis Mace D.CLEMENTEEN [facility] 8:33 AM 02/20/24

## 2024-02-20 ENCOUNTER — Ambulatory Visit (INDEPENDENT_AMBULATORY_CARE_PROVIDER_SITE_OTHER): Admitting: Sports Medicine

## 2024-02-20 VITALS — HR 56 | Ht 72.0 in | Wt 167.0 lb

## 2024-02-20 DIAGNOSIS — M51362 Other intervertebral disc degeneration, lumbar region with discogenic back pain and lower extremity pain: Secondary | ICD-10-CM

## 2024-02-20 DIAGNOSIS — M5134 Other intervertebral disc degeneration, thoracic region: Secondary | ICD-10-CM | POA: Diagnosis not present

## 2024-02-20 DIAGNOSIS — M4810 Ankylosing hyperostosis [Forestier], site unspecified: Secondary | ICD-10-CM | POA: Diagnosis not present

## 2024-02-20 DIAGNOSIS — M5136 Other intervertebral disc degeneration, lumbar region with discogenic back pain only: Secondary | ICD-10-CM

## 2024-02-20 DIAGNOSIS — G8929 Other chronic pain: Secondary | ICD-10-CM

## 2024-02-20 MED ORDER — METHYLPREDNISOLONE 4 MG PO TBPK: ORAL_TABLET | ORAL | 0 refills | Status: DC

## 2024-02-20 NOTE — Patient Instructions (Signed)
 Prednisone dos pak   Epidural left L5-S1 Right L3-4  Follow up 2 weeks after to discuss results

## 2024-02-25 ENCOUNTER — Encounter: Payer: Self-pay | Admitting: Family Medicine

## 2024-02-25 ENCOUNTER — Ambulatory Visit (INDEPENDENT_AMBULATORY_CARE_PROVIDER_SITE_OTHER): Admitting: Family Medicine

## 2024-02-25 VITALS — BP 98/61 | HR 52 | Temp 97.5°F | Ht 72.0 in | Wt 165.6 lb

## 2024-02-25 DIAGNOSIS — R21 Rash and other nonspecific skin eruption: Secondary | ICD-10-CM | POA: Diagnosis not present

## 2024-02-25 NOTE — Progress Notes (Signed)
 OFFICE VISIT  02/25/2024  CC:  Chief Complaint  Patient presents with   Rash    Follow up; pt states it is not improving   Patient is a 63 y.o. male who presents for rash on lips.  INTERIM HX: Several months ago he developed some burning bumps on his lips. Valtrex  was prescribed but this did not help any. Clotrimazole -betamethasone  cream was then prescribed and about a 1 to 2-week course of this clears it up completely.  And then typically returns several weeks later. He has nothing in his mouth or throat. He has some dry skin from time to time on both elbows that has been present a long time and he treats this with topical steroid and it helps.  He did not start applying anything new to his lips prior to the onset of the burning bumps.   Past Medical History:  Diagnosis Date   Anxiety    BPH (benign prostatic hypertrophy)    Cataract    artifiicial lenses in both eyes   Cervical spondylosis    with radiculopathy not responsive to conservative therapies:  [facility]: ACDF surgery C5-C7 performed 04/05/15   Chronic pain syndrome    cervicalgia, heredetary PN, LBP   COVID-19 virus infection 11/2020   Degenerative disc disease, lumbar    Depression    Difficulty controlling anger    + GAD with OCD tendencies + memory changes---consider neuropsych testing   DISH (diffuse idiopathic skeletal hyperostosis)    Dysphagia    Dysplastic nevus 10/2015   left mid back: Dr. Livingston following   GERD (gastroesophageal reflux disease)    +LPR (ENT 09/2022)   Glaucoma    History of adenomatous polyp of colon    IBS (irritable bowel syndrome)    +constipation   Microscopic colitis 06/2019   colonoscopy + 06/2019   Neuropathic pain    Peripheral neuropathy    Idiopathic, inherited per pt's neurologist, Dr. Tonuzi. Various labs normal 07/2016 except vitamin B6 was siginificantly elevated.  Allergist eval/allergy testing (including food) ALL NORMAL/NEG 07/2016.    Restless legs syndrome 09/27/2014   Throat irritation    chronic (since cervical spine surgery 2016).  Dr. Arlana 2017->laryngoscopy normal, CT sinuses normal, barium swallow normal, trial of protonix no help.     Past Surgical History:  Procedure Laterality Date   Ant cerv decompression/discectomy w/fusion C 5-7  04/05/2015   C5-7 ACDF   CATARACT EXTRACTION Bilateral    COLONOSCOPY  06/29/2019   FOR EVAL OF UNEXPLAINED DIARRHEA-->normal, bx c/w microscopic colitis->uceris  and budesonide  rx'd by Dr. Shila 06/2019   COLONOSCOPY W/ POLYPECTOMY  08/2012; 05/23/16   2017, adenomatous polyp--recall 5 yrs.   ESOPHAGOGASTRODUODENOSCOPY     05/2023.  Some gastritis was seen.  No stricture but dilation was done anyway.  Biopsies were benign.  No H. pylori.  No intraepithelial eosinophils.   EYE SURGERY Right 09/06/2020   TRABECULECTOMY Bilateral    for uncontrolled glaucoma    TRANSURETHRAL RESECTION OF PROSTATE  2008   due to enlarged prostate   VASECTOMY  2001    Outpatient Medications Prior to Visit  Medication Sig Dispense Refill   Alpha-Lipoic Acid 300 MG CAPS Take 1 capsule by mouth 2 (two) times daily.     busPIRone  (BUSPAR ) 15 MG tablet TAKE 2 TABLETS BY MOUTH TWICE A DAY 360 tablet 0   clonazePAM  (KLONOPIN ) 0.5 MG tablet Take 1 tablet (0.5 mg total) by mouth 2 (two) times daily as needed  for anxiety. 120 tablet 0   dorzolamide-timolol (COSOPT) 22.3-6.8 MG/ML ophthalmic solution Place 1 drop into the right eye 2 (two) times daily.     famotidine (PEPCID) 10 MG tablet Take 10 mg by mouth 2 (two) times daily.     gabapentin  (NEURONTIN ) 600 MG tablet Take 600 mg by mouth. Take 2 tablets in the morning & 2 in the evening     HYDROcodone -acetaminophen  (NORCO) 7.5-325 MG tablet Take 1 tablet by mouth every 6 (six) hours as needed for moderate pain (pain score 4-6). 30 tablet 0   latanoprost (XALATAN) 0.005 % ophthalmic solution Place 1 drop into both eyes at bedtime.     meloxicam   (MOBIC ) 15 MG tablet Take 1 tablet (15 mg total) by mouth daily. 30 tablet 0   metaxalone  (SKELAXIN ) 800 MG tablet Take 1 tablet (800 mg total) by mouth 3 (three) times daily as needed for muscle spasms (back pain). 30 tablet 0   Omega-3 Fatty Acids (OMEGA-3 FISH OIL) 300 MG CAPS Take 1 capsule by mouth daily.     polyethylene glycol powder (GLYCOLAX/MIRALAX) 17 GM/SCOOP powder Take by mouth.     sildenafil  (REVATIO ) 20 MG tablet 1-5 tabs po qd prn intercourse 30 tablet 11   valACYclovir  (VALTREX ) 1000 MG tablet Take 1 tablet (1,000 mg total) by mouth 2 (two) times daily. 14 tablet 0   methylPREDNISolone  (MEDROL  DOSEPAK) 4 MG TBPK tablet Take 6 tablets on day 1.  Take 5 tablets on day 2.  Take 4 tablets on day 3.  Take 3 tablets on day 4.  Take 2 tablets on day 5.  Take 1 tablet on day 6. 21 tablet 0   No facility-administered medications prior to visit.    No Known Allergies  Review of Systems As per HPI  PE:    02/25/2024   10:10 AM 02/20/2024    8:01 AM 01/23/2024   11:06 AM  Vitals with BMI  Height 6' 0 6' 0 6' 0  Weight 165 lbs 10 oz 167 lbs 169 lbs  BMI 22.45 22.64 22.92  Systolic 98  116  Diastolic 61  68  Pulse 52 56 68   Physical Exam  General: Alert and well-appearing. The vermilion border of the upper and lower lip have a few subtle pink papules without any erythema or swelling.  No sign of folliculitis.  No chapping.  No fissuring.  LABS:  Last CBC Lab Results  Component Value Date   WBC 6.6 01/23/2024   HGB 15.8 01/23/2024   HCT 46.5 01/23/2024   MCV 90.2 01/23/2024   MCH 31.0 03/14/2023   RDW 14.0 01/23/2024   PLT 165.0 01/23/2024   Last metabolic panel Lab Results  Component Value Date   GLUCOSE 89 01/23/2024   NA 140 01/23/2024   K 4.3 01/23/2024   CL 103 01/23/2024   CO2 29 01/23/2024   BUN 20 01/23/2024   CREATININE 1.15 01/23/2024   GFR 68.71 01/23/2024   CALCIUM 9.5 01/23/2024   PROT 7.1 01/23/2024   ALBUMIN 4.6 01/23/2024   LABGLOB 2.0  03/03/2023   BILITOT 0.8 01/23/2024   ALKPHOS 64 01/23/2024   AST 16 01/23/2024   ALT 19 01/23/2024   ANIONGAP 6 03/14/2023   Last thyroid  functions Lab Results  Component Value Date   TSH 2.93 01/23/2024   IMPRESSION AND PLAN:  Burning papules around vermilion border of lips. Unknown etiology but suspect inflammatory and not infectious. Does not appear fungal, viral, or bacterial. He has  triamcinolone  cream at home and I recommended he try this instead of the combo antifungal/steroid. He has a dermatologist who he sees about once a year who he will mention this to if it is still a problem.  An After Visit Summary was printed and given to the patient.  FOLLOW UP: No follow-ups on file.  Signed:  Gerlene Hockey, MD           02/25/2024

## 2024-03-03 ENCOUNTER — Other Ambulatory Visit: Payer: Self-pay | Admitting: Sports Medicine

## 2024-03-05 ENCOUNTER — Encounter: Payer: Self-pay | Admitting: Sports Medicine

## 2024-03-05 ENCOUNTER — Telehealth: Payer: Self-pay | Admitting: Sports Medicine

## 2024-03-05 NOTE — Telephone Encounter (Signed)
 Patient called stating that his epidural was denied or still pending so his appointment was canceled. He was told that the denial was sent to us  for an appeal?

## 2024-03-05 NOTE — Telephone Encounter (Signed)
 I have the denial letter and will be figuring out the information needed to send to insurance. Will update [facility] what insruance tells me

## 2024-03-05 NOTE — Discharge Instructions (Signed)

## 2024-03-08 ENCOUNTER — Inpatient Hospital Stay
Admission: RE | Admit: 2024-03-08 | Discharge: 2024-03-08 | Disposition: A | Discharge status: Home / Self Care | Source: Ambulatory Visit | Attending: Sports Medicine | Admitting: Sports Medicine

## 2024-03-10 ENCOUNTER — Ambulatory Visit

## 2024-03-10 ENCOUNTER — Encounter: Payer: Self-pay | Admitting: Gastroenterology

## 2024-03-10 ENCOUNTER — Ambulatory Visit: Admitting: Gastroenterology

## 2024-03-10 VITALS — BP 90/60 | HR 57 | Ht 72.0 in | Wt 165.0 lb

## 2024-03-10 DIAGNOSIS — R110 Nausea: Secondary | ICD-10-CM | POA: Diagnosis not present

## 2024-03-10 DIAGNOSIS — R194 Change in bowel habit: Secondary | ICD-10-CM

## 2024-03-10 DIAGNOSIS — R195 Other fecal abnormalities: Secondary | ICD-10-CM

## 2024-03-10 NOTE — Progress Notes (Signed)
 Keith Mckee [id number] [birth year]   Chief Complaint: Nausea  Referring Provider: Candise Aleene DEL, MD Primary GI MD: Dr. Shila  HPI: Keith Mckee is a 63 y.o. male with past medical history of anxiety/depression, BPH, chronic pain syndrome, DISH, dysphagia, GERD, adenomatous colon polyps, IBS, lymphocytic colitis  who presents today for follow up.    Seen 05/29/2023 by Vina Dasen, NP for complaint of nausea without vomiting.  Symptoms improved but not resolved with famotidine.  Reported GERD history, asymptomatic for years off treatment but upper GI series done for dysphagia 10/2022 showed mild reflux.  Dysphagia improved with exercises given by SLP.  Had been having trouble swallowing saliva, not food. History of lymphocytic colitis, possibly related to SSRI use with no recurrence of diarrhea.  Chronic constipation managed with daily MiraLAX. He has history of chronic thrombocytopenia, no evidence of cirrhosis on RUQ US .  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   He underwent EGD 06/12/2023 with finding of gastritis, biopsied.  Esophagus was empirically dilated due to patient's dysphagia but no esophageal abnormality was seen to explain this.   Seen by PCP 12/22/2023 for complaint of abdominal discomfort and nausea.  Noted to have had fairly extensive workup which was unrevealing.  Advised to continue Pepcid 10 mg twice a day. Hesitant to try PPI due to long term side effects/nutritional concerns.   At last visit 01/23/2024 patient reported persistent symptoms of nausea and abdominal pain.  Improvement on Pepcid and omeprazole.  Had unremarkable RUQ US  and EGD in December with findings of gastritis and otherwise normal.  Reported that in the last year he has been having soft, less formed stools and some intermittent diarrhea, no rectal bleeding or melena.  Abdominal pain not necessarily related to bowel movements. Advised to continue OTC omeprazole 20  mg daily, famotidine 10 mg 1-2 times daily.  Consider SIBO breath test versus empiric treatment with Xifaxan for IBS-D, consider abdominal imaging/HIDA scan, consider repeat colonoscopy.  Labs were normal.  Fecal calprotectin was borderline and colonoscopy was recommended, however patient wanted to wait and repeat fecal calprotectin and follow-up in the office to discuss colonoscopy.  Repeat fecal calprotectin, and C. difficile PCR, not completed.   Patient states his symptoms have improved since last visit.  Stopped taking certain medications and this has helped.  Previously was getting very severe nausea, now just getting some occasional unsettled feeling in his abdomen.  Says he is not really having any abdominal pain.  Was taking a couple medications for neuropathy including ALA which he has discontinued.  Continuing to take his gabapentin .  Also states that he reduced BuSpar  from 4 pills daily to just 1 at night.  Stopped taking clonazepam .  Since making these medication adjustments his nausea has improved significantly.  Has also noticed that his stools have become more solid since last visit, though still pretty soft.  Was never having constant diarrhea, just occasional diarrhea, and this has also improved.  Has a bowel movement typically every other day.  Was having some occasional gas pains previously, this has also improved.  Says he did repeat a fecal calprotectin last week but results are not in, he says there was an issue with the lab.  At this time he is not interested in repeating a fecal calprotectin because he does not want to have to drive back to the lab.  Previous GI Procedures/Imaging   EGD 06/12/2023 - Z- line regular, 40 cm from the incisors.  -  No endoscopic esophageal abnormality to explain patient' s dysphagia. Esophagus dilated. Dilated.  - Gastritis. Biopsied.  - Normal examined duodenum.  - Biopsies were taken with a cold forceps for evaluation of eosinophilic  esophagitis. Path: 1. Surgical [P], gastric antrum and gastric body :       -  BENIGN GASTRIC MUCOSA       -  NO H. PYLORI, INTESTINAL METAPLASIA OR MALIGNANCY IDENTIFIED        2. Surgical [P], distal esophagus :       -  BENIGN SQUAMOUS MUCOSA       -  NO INCREASED INTRAEPITHELIAL EOSINOPHILS        3. Surgical [P], proximal esophagus :       -  BENIGN SQUAMOUS MUCOSA       -  NO INCREASED INTRAEPITHELIAL EOSINOPHILS    RUQ US  04/04/2023 No cholelithiasis or sonographic evidence for acute cholecystitis.    Colonoscopy 06/29/2019 - Normal mucosa in the entire examined colon. Biopsied.  - The examination was otherwise normal. - Recall 10 years Path: 1. Surgical [P], right colon - FINDINGS CONSISTENT WITH LYMPHOCYTIC COLITIS. - NO DYSPLASIA OR MALIGNANCY. 2. Surgical [P], left colon - FINDINGS CONSISTENT WITH LYMPHOCYTIC COLITIS. - NO DYSPLASIA OR MALIGNANCY.   Colonoscopy 05/23/2016 - One 2 mm polyp in the ascending colon, removed with a cold biopsy forceps. Resected and retrieved.  - One 6 mm polyp in the rectum, removed with a cold snare. Resected and retrieved.  - Non-bleeding internal hemorrhoids. - Await pathology results.  - Repeat colonoscopy in 5-10 years for surveillance based on pathology results. Path: 1. Surgical [P], ascending, polyp - TUBULAR ADENOMA. - NO HIGH GRADE DYSPLASIA OR MALIGNANCY. 2. Surgical [P], rectum, polyp - HYPERPLASTIC POLYP. - NO DYSPLASIA OR MALIGNANCY.   Past Medical History:  Diagnosis Date   Anxiety    BPH (benign prostatic hypertrophy)    Cataract    artifiicial lenses in both eyes   Cervical spondylosis    with radiculopathy not responsive to conservative therapies:  [facility] 02/2015: ACDF surgery C5-C7 performed 04/05/15   Chronic pain syndrome    cervicalgia, heredetary PN, LBP   COVID-19 virus infection 11/2020   Degenerative disc disease, lumbar    Depression    Difficulty controlling anger    + GAD  with OCD tendencies + memory changes---consider neuropsych testing   DISH (diffuse idiopathic skeletal hyperostosis)    Dysphagia    Dysplastic nevus 10/2015   left mid back: Dr. Livingston following   GERD (gastroesophageal reflux disease)    +LPR (ENT 09/2022)   Glaucoma    History of adenomatous polyp of colon    IBS (irritable bowel syndrome)    +constipation   Microscopic colitis 06/2019   colonoscopy + 06/2019   Neuropathic pain    Peripheral neuropathy    Idiopathic, inherited per pt's neurologist, Dr. Tonuzi. Various labs normal 07/2016 except vitamin B6 was siginificantly elevated.  Allergist eval/allergy testing (including food) ALL NORMAL/NEG 07/2016.   Restless legs syndrome 09/27/2014   Throat irritation    chronic (since cervical spine surgery 2016).  Dr. Arlana 2017->laryngoscopy normal, CT sinuses normal, barium swallow normal, trial of protonix no help.     Past Surgical History:  Procedure Laterality Date   Ant cerv decompression/discectomy w/fusion C 5-7  04/05/2015   C5-7 ACDF   CATARACT EXTRACTION Bilateral    COLONOSCOPY  06/29/2019   FOR EVAL OF UNEXPLAINED DIARRHEA-->normal, bx c/w microscopic colitis->uceris   and budesonide  rx'd by Dr. Shila 06/2019   COLONOSCOPY W/ POLYPECTOMY  08/2012; 05/23/16   2017, adenomatous polyp--recall 5 yrs.   ESOPHAGOGASTRODUODENOSCOPY     05/2023.  Some gastritis was seen.  No stricture but dilation was done anyway.  Biopsies were benign.  No H. pylori.  No intraepithelial eosinophils.   EYE SURGERY Right 09/06/2020   TRABECULECTOMY Bilateral    for uncontrolled glaucoma    TRANSURETHRAL RESECTION OF PROSTATE  2008   due to enlarged prostate   VASECTOMY  2001    Current Outpatient Medications  Medication Sig Dispense Refill   busPIRone  (BUSPAR ) 15 MG tablet TAKE 2 TABLETS BY MOUTH TWICE A DAY (Patient taking differently: Take 15 mg by mouth daily at 6 (six) AM.) 360 tablet 0   clonazePAM  (KLONOPIN ) 0.5 MG tablet Take 1 tablet  (0.5 mg total) by mouth 2 (two) times daily as needed for anxiety. 120 tablet 0   dorzolamide-timolol (COSOPT) 22.3-6.8 MG/ML ophthalmic solution Place 1 drop into the right eye 2 (two) times daily.     famotidine (PEPCID) 10 MG tablet Take 10 mg by mouth 2 (two) times daily.     gabapentin  (NEURONTIN ) 600 MG tablet Take 600 mg by mouth. Take 2 tablets in the morning & 2 in the evening     HYDROcodone -acetaminophen  (NORCO) 7.5-325 MG tablet Take 1 tablet by mouth every 6 (six) hours as needed for moderate pain (pain score 4-6). 30 tablet 0   latanoprost (XALATAN) 0.005 % ophthalmic solution Place 1 drop into both eyes at bedtime.     Omega-3 Fatty Acids (OMEGA-3 FISH OIL) 300 MG CAPS Take 1 capsule by mouth daily.     polyethylene glycol powder (GLYCOLAX/MIRALAX) 17 GM/SCOOP powder Take by mouth.     sildenafil  (REVATIO ) 20 MG tablet 1-5 tabs po qd prn intercourse 30 tablet 11   No current facility-administered medications for this visit.    Allergies as of 03/10/2024   (No Known Allergies)    Family History  Problem Relation Age of Onset   Glaucoma Father    Heart disease Father    Neuropathy Father        Peripheral neuropathy   Arthritis Father    Parkinson's disease Father    Schizophrenia Daughter    Colon cancer Neg Hx    Rectal cancer Neg Hx    Stomach cancer Neg Hx    Esophageal cancer Neg Hx     Social History   Tobacco Use   Smoking status: Former    Current packs/day: 0.00    Average packs/day: 0.8 packs/day for 15.0 years (11.3 ttl pk-yrs)    Types: Cigarettes    Start date: 06/25/1983    Quit date: 06/24/1998    Years since quitting: 25.7   Smokeless tobacco: Never   Tobacco comments:    2 pack per week  Vaping Use   Vaping status: Never Used  Substance Use Topics   Alcohol use: Yes    Alcohol/week: 12.0 standard drinks of alcohol    Types: 12 Cans of beer per week    Comment: Patient drinks 12 beers a week   Drug use: No     Review of Systems:     Constitutional: No weight loss, fever, chills Cardiovascular: No chest pain Respiratory: No SOB Gastrointestinal: See HPI and otherwise negative Hematologic: No bleeding     Physical Exam:  Vital signs: BP 90/60   Pulse (!) 57   Ht 6' (1.829 m)  Wt 165 lb (74.8 kg)   BMI 22.38 kg/m   Wt Readings from Last 3 Encounters:  03/10/24 165 lb (74.8 kg)  02/25/24 165 lb 9.6 oz (75.1 kg)  02/20/24 167 lb (75.8 kg)    Constitutional: Pleasant, well-appearing male in NAD, alert and cooperative Head:  Normocephalic and atraumatic.  Eyes: No scleral icterus.  Respiratory: Respirations even and unlabored. Lungs clear to auscultation bilaterally.  No wheezes, crackles, or rhonchi.  Cardiovascular: Regular rhythm, rate 57. No murmurs. No peripheral edema. Gastrointestinal:  Soft, nondistended, nontender. No rebound or guarding. Normal bowel sounds. No appreciable masses or hepatomegaly. Rectal:  Not performed.  Neurologic:  Alert and oriented x4;  grossly normal neurologically.  Skin:   Dry and intact without significant lesions or rashes. Psychiatric: Oriented to person, place and time. Demonstrates good judgement and reason without abnormal affect or behaviors.   RELEVANT LABS AND IMAGING: CBC    Component Value Date/Time   WBC 6.6 01/23/2024 1152   RBC 5.16 01/23/2024 1152   HGB 15.8 01/23/2024 1152   HCT 46.5 01/23/2024 1152   PLT 165.0 01/23/2024 1152   MCV 90.2 01/23/2024 1152   MCH 31.0 03/14/2023 0935   MCHC 34.0 01/23/2024 1152   RDW 14.0 01/23/2024 1152   LYMPHSABS 2.2 01/23/2024 1152   MONOABS 0.5 01/23/2024 1152   EOSABS 0.3 01/23/2024 1152   BASOSABS 0.1 01/23/2024 1152    CMP     Component Value Date/Time   NA 140 01/23/2024 1152   NA 140 03/03/2023 1645   K 4.3 01/23/2024 1152   CL 103 01/23/2024 1152   CO2 29 01/23/2024 1152   GLUCOSE 89 01/23/2024 1152   BUN 20 01/23/2024 1152   BUN 18 03/03/2023 1645   CREATININE 1.15 01/23/2024 1152   CREATININE  1.10 10/02/2021 0932   CALCIUM 9.5 01/23/2024 1152   PROT 7.1 01/23/2024 1152   PROT 6.6 03/03/2023 1645   ALBUMIN 4.6 01/23/2024 1152   ALBUMIN 4.6 03/03/2023 1645   AST 16 01/23/2024 1152   ALT 19 01/23/2024 1152   ALKPHOS 64 01/23/2024 1152   BILITOT 0.8 01/23/2024 1152   BILITOT 0.6 03/03/2023 1645   GFRNONAA >60 03/14/2023 0935     Assessment/Plan:   Loose stools Nausea Patient seen today for follow-up.  In the last year has been having nausea, soft stools, intermittent diarrhea.  Had borderline fecal calprotectin after last visit of 90 and recommendation was for repeat, which he says he turned in but seems to have been an issue with the lab and we have no results for this.  Since last visit his symptoms overall have improved.  States he made some medication changes, stopped taking a couple medications and has noticed significant improvement in his nausea, which was his main concern.  Continues to have regular bowel movements every other day which are soft but not loose or watery.  Denies any blood in his stool or melena.  Occasionally having an unsettled feeling in his stomach but no pain. He is not interested in colonoscopy at this time.  Does not necessarily want to repeat a fecal calprotectin due to having to drive back to the lab.  Will give him an order for fecal calprotectin if he does decide he wants to repeat this. Otherwise, as symptoms have improved can have him follow-up as needed.  If symptoms were to worsen or he has recurrence of loose stools, would recommend colonoscopy at that point.  For colon cancer screening purposes, he  is not due for repeat until 2031.   Camie Furbish, PA-C [facility] 03/10/2024, 10:58 AM  Patient Care Team: Candise Aleene DEL, MD as PCP - General (Family Medicine) Lonni Slain, MD as PCP - Cardiology (Cardiology) Bond, Reyes Mcardle, MD as Consulting Physician (Ophthalmology) Livingston Rigg, MD as Consulting Physician  (Dermatology) Shila Gustav GAILS, MD as Consulting Physician (Gastroenterology) Charlyne Helling, MD as Consulting Physician (Neurology) Frutoso Luz, MD as Consulting Physician (Allergy) Evertt Meribeth ORN, MD as Consulting Physician (Ophthalmology) Alverda Mardy SAUNDERS, MD as Consulting Physician (Rheumatology) Skotnicki, Meghan A, DO as Consulting Physician (Otolaryngology) Delayne Mirna CROME, DO as Consulting Physician (Otolaryngology)

## 2024-03-10 NOTE — Patient Instructions (Addendum)
 _______________________________________________________  If your blood pressure at your visit was 140/90 or greater, please contact your primary care physician to follow up on this.  _______________________________________________________  If you are age 61 or older, your body mass index should be between 23-30. Your Body mass index is 22.38 kg/m. If this is out of the aforementioned range listed, please consider follow up with your Primary Care Provider.  If you are age 72 or younger, your body mass index should be between 19-25. Your Body mass index is 22.38 kg/m. If this is out of the aformentioned range listed, please consider follow up with your Primary Care Provider.   ________________________________________________________  The [facility] providers would like to encourage you to use MYCHART to communicate with providers for non-urgent requests or questions.  Due to long hold times on the telephone, sending your provider a message by [facility] may be a faster and more efficient way to get a response.  Please allow 48 business hours for a response.  Please remember that this is for non-urgent requests.  _______________________________________________________  [facility] is using a team-based approach to care.  Your team is made up of your doctor and two to three APPS. Our APPS (Nurse Practitioners and Physician Assistants) work with your physician to ensure care continuity for you. They are fully qualified to address your health concerns and develop a treatment plan. They communicate directly with your gastroenterologist to care for you. Seeing the Advanced Practice Practitioners on your physician's team can help you by facilitating care more promptly, often allowing for earlier appointments, access to diagnostic testing, procedures, and other specialty referrals.    Lab order form given for fecal calprotectin. Please take to a [facility] or [facility] facility to see about retesting.  Fax number is [phone]  Please call us  if your symptoms persist.   Follow up as needed  It was a pleasure to see you today!  Thank you for trusting me with your gastrointestinal care!

## 2024-03-12 LAB — CALPROTECTIN, FECAL: Calprotectin, Fecal: 52 ug/g (ref 0–120)

## 2024-03-16 ENCOUNTER — Ambulatory Visit: Payer: Self-pay | Admitting: Gastroenterology

## 2024-03-16 NOTE — Telephone Encounter (Signed)
 Attempted to reach patient, reached VM.  Requested return phone call.

## 2024-03-17 NOTE — Telephone Encounter (Signed)
 Reviewed labs and recommendations with patient. Patient will call back to schedule f/u appointment with Dr Shila. Questions answered to satisfaction. Sent  MyChart message with copy of recommendations.

## 2024-05-03 ENCOUNTER — Telehealth: Payer: Self-pay | Admitting: Sports Medicine

## 2024-05-03 NOTE — Telephone Encounter (Signed)
 Patient called and stated that the steroid injections in two different places in his back has finally been approved by insurance. He would like to go ahead and schedule for that and would like to know where to call. Please advise.

## 2024-05-17 NOTE — Discharge Instructions (Signed)

## 2024-05-19 ENCOUNTER — Inpatient Hospital Stay
Admission: RE | Admit: 2024-05-19 | Discharge: 2024-05-19 | Disposition: A | Source: Ambulatory Visit | Attending: Sports Medicine

## 2024-05-19 DIAGNOSIS — M51362 Other intervertebral disc degeneration, lumbar region with discogenic back pain and lower extremity pain: Secondary | ICD-10-CM

## 2024-05-19 DIAGNOSIS — G8929 Other chronic pain: Secondary | ICD-10-CM

## 2024-05-19 DIAGNOSIS — M4810 Ankylosing hyperostosis [Forestier], site unspecified: Secondary | ICD-10-CM

## 2024-05-19 DIAGNOSIS — M5136 Other intervertebral disc degeneration, lumbar region with discogenic back pain only: Secondary | ICD-10-CM

## 2024-05-19 DIAGNOSIS — M5134 Other intervertebral disc degeneration, thoracic region: Secondary | ICD-10-CM

## 2024-05-19 MED ORDER — METHYLPREDNISOLONE ACETATE 40 MG/ML INJ SUSP (RADIOLOG
80.0000 mg | Freq: Once | INTRAMUSCULAR | Status: AC
  Administered 2024-05-19: 80 mg via EPIDURAL

## 2024-05-19 MED ORDER — IOPAMIDOL (ISOVUE-M 200) INJECTION 41%
1.0000 mL | Freq: Once | INTRAMUSCULAR | Status: AC
  Administered 2024-05-19: 1 mL via EPIDURAL

## 2024-05-29 ENCOUNTER — Encounter: Payer: Self-pay | Admitting: Family Medicine

## 2024-05-31 MED ORDER — HYDROCODONE-ACETAMINOPHEN 7.5-325 MG PO TABS: 1.0000 | ORAL_TABLET | Freq: Four times a day (QID) | ORAL | 0 refills | Status: AC | PRN

## 2024-05-31 NOTE — Telephone Encounter (Signed)
 Rx sent.

## 2024-06-01 NOTE — Progress Notes (Unsigned)
    [facility] [address] [place] [address] Phone: [phone]   Assessment and Plan:     ***    Pertinent previous records reviewed include ***   Follow Up: ***     Subjective:   I, Jenay Morici, am serving as a neurosurgeon for Doctor Morene Mace   Chief Complaint: low back pain   HPI:    01/08/2024 Patient is a 63 year old male with low back pain. Patient states pain since April. No MOI. Meds dont really help. Muscle relaxer doesn't help. Has been in PT. Pain does not radiate down his legs anymore. No numbness or tingling.    02/20/2024 Patient states no better   06/02/2024 Patient states   Relevant Historical Information: GERD  Additional pertinent review of systems negative.   Current Outpatient Medications:    busPIRone  (BUSPAR ) 15 MG tablet, TAKE 2 TABLETS BY MOUTH TWICE A DAY (Patient taking differently: Take 15 mg by mouth daily at 6 (six) AM.), Disp: 360 tablet, Rfl: 0   clonazePAM  (KLONOPIN ) 0.5 MG tablet, Take 1 tablet (0.5 mg total) by mouth 2 (two) times daily as needed for anxiety., Disp: 120 tablet, Rfl: 0   dorzolamide-timolol (COSOPT) 22.3-6.8 MG/ML ophthalmic solution, Place 1 drop into the right eye 2 (two) times daily., Disp: , Rfl:    famotidine (PEPCID) 10 MG tablet, Take 10 mg by mouth 2 (two) times daily., Disp: , Rfl:    gabapentin  (NEURONTIN ) 600 MG tablet, Take 600 mg by mouth. Take 2 tablets in the morning & 2 in the evening, Disp: , Rfl:    HYDROcodone -acetaminophen  (NORCO) 7.5-325 MG tablet, Take 1 tablet by mouth every 6 (six) hours as needed for moderate pain (pain score 4-6)., Disp: 30 tablet, Rfl: 0   latanoprost (XALATAN) 0.005 % ophthalmic solution, Place 1 drop into both eyes at bedtime., Disp: , Rfl:    Omega-3 Fatty Acids (OMEGA-3 FISH OIL) 300 MG CAPS, Take 1 capsule by mouth daily., Disp: , Rfl:    polyethylene glycol powder (GLYCOLAX/MIRALAX) 17 GM/SCOOP powder, Take by  mouth., Disp: , Rfl:    sildenafil  (REVATIO ) 20 MG tablet, 1-5 tabs po qd prn intercourse, Disp: 30 tablet, Rfl: 11   Objective:     There were no vitals filed for this visit.    There is no height or weight on file to calculate BMI.    Physical Exam:    ***   Electronically signed by:  Odis Mace [facility] 9:30 AM 06/01/24

## 2024-06-02 ENCOUNTER — Ambulatory Visit: Admitting: Sports Medicine

## 2024-06-02 VITALS — BP 102/60 | HR 54 | Ht 72.0 in | Wt 170.4 lb

## 2024-06-02 DIAGNOSIS — M545 Low back pain, unspecified: Secondary | ICD-10-CM | POA: Diagnosis not present

## 2024-06-02 DIAGNOSIS — G8929 Other chronic pain: Secondary | ICD-10-CM

## 2024-06-02 DIAGNOSIS — M51362 Other intervertebral disc degeneration, lumbar region with discogenic back pain and lower extremity pain: Secondary | ICD-10-CM | POA: Diagnosis not present

## 2024-06-02 DIAGNOSIS — M4810 Ankylosing hyperostosis [Forestier], site unspecified: Secondary | ICD-10-CM | POA: Diagnosis not present

## 2024-06-02 NOTE — Patient Instructions (Addendum)
 Epidural right L3-4  Follow up 2 weeks after to discuss results in person or virtual   Speak with front desk to change your preferred phone number

## 2024-06-23 ENCOUNTER — Other Ambulatory Visit: Payer: Self-pay | Admitting: Family Medicine

## 2024-06-29 ENCOUNTER — Telehealth: Payer: Self-pay

## 2024-06-29 NOTE — Telephone Encounter (Signed)
 Copied from CRM [id number]. Topic: Clinical - Request for Lab/Test Order >> Jun 29, 2024 10:19 AM Deaijah H wrote: Reason for CRM: patient would like to have Hep A/B and Tdap due to going to [place] in 3 wks.   Pt's last tdap was Jan 2016, he is currently due for this. We do not have Twinrix available for pt.  Please further advise on nurse visit

## 2024-06-29 NOTE — Telephone Encounter (Signed)
 Pt advised to go to health department due to other possible need for other immunizations

## 2024-06-29 NOTE — Telephone Encounter (Signed)
 Can we order Twinrix for him and get it back before he has to go to [place]?  If so, please order it and tell him when to come in for a nurse visit for this vaccine as well as Tdap.  If not able to order it in time then it would probably be best for him to go to the health department or the travel clinic in [place] to get Twinrix and for his convenience he can get his Tdap there as well.

## 2024-07-07 ENCOUNTER — Encounter: Payer: Self-pay | Admitting: Family Medicine

## 2024-07-08 MED ORDER — AZITHROMYCIN 250 MG PO TABS: ORAL_TABLET | ORAL | 0 refills | Status: AC

## 2024-07-08 NOTE — Telephone Encounter (Signed)
 Okay, I sent in a prescription for azithromycin  to take if you get traveler's diarrhea.
